# Patient Record
Sex: Male | Born: 1955 | Race: Black or African American | Hispanic: No | Marital: Married | Smoking: Former smoker
Health system: Southern US, Community
[De-identification: ages and names within clinical notes are randomized; demographics above are authoritative.]

## PROBLEM LIST (undated history)

## (undated) DIAGNOSIS — I10 Essential (primary) hypertension: Secondary | ICD-10-CM

## (undated) DIAGNOSIS — R569 Unspecified convulsions: Secondary | ICD-10-CM

## (undated) DIAGNOSIS — I639 Cerebral infarction, unspecified: Secondary | ICD-10-CM

## (undated) HISTORY — PX: OTHER SURGICAL HISTORY: SHX169

## (undated) HISTORY — PX: LUNG REMOVAL, PARTIAL: SHX233

## (undated) HISTORY — PX: CLOSED REDUCTION CRANIOFACIAL SEPARATION: SUR254

---

## 2004-04-01 ENCOUNTER — Other Ambulatory Visit: Payer: Self-pay

## 2005-10-03 ENCOUNTER — Emergency Department: Payer: Self-pay | Admitting: Emergency Medicine

## 2007-05-31 ENCOUNTER — Inpatient Hospital Stay: Payer: Self-pay | Admitting: Surgery

## 2007-05-31 ENCOUNTER — Other Ambulatory Visit: Payer: Self-pay

## 2007-06-07 ENCOUNTER — Other Ambulatory Visit: Payer: Self-pay

## 2007-06-07 ENCOUNTER — Emergency Department: Payer: Self-pay | Admitting: Emergency Medicine

## 2007-07-16 ENCOUNTER — Emergency Department: Payer: Self-pay | Admitting: Emergency Medicine

## 2007-07-16 ENCOUNTER — Ambulatory Visit: Payer: Self-pay | Admitting: Internal Medicine

## 2008-01-03 ENCOUNTER — Other Ambulatory Visit: Payer: Self-pay

## 2008-01-03 ENCOUNTER — Inpatient Hospital Stay: Payer: Self-pay | Admitting: Internal Medicine

## 2008-01-04 ENCOUNTER — Other Ambulatory Visit: Payer: Self-pay

## 2008-01-12 ENCOUNTER — Emergency Department: Payer: Self-pay | Admitting: Emergency Medicine

## 2008-01-12 ENCOUNTER — Other Ambulatory Visit: Payer: Self-pay

## 2008-01-13 ENCOUNTER — Other Ambulatory Visit: Payer: Self-pay

## 2008-01-13 ENCOUNTER — Inpatient Hospital Stay: Payer: Self-pay | Admitting: Internal Medicine

## 2009-01-26 ENCOUNTER — Inpatient Hospital Stay: Payer: Self-pay | Admitting: Internal Medicine

## 2009-03-06 ENCOUNTER — Emergency Department: Payer: Self-pay | Admitting: Emergency Medicine

## 2009-10-22 ENCOUNTER — Inpatient Hospital Stay: Payer: Self-pay | Admitting: Internal Medicine

## 2009-10-23 ENCOUNTER — Inpatient Hospital Stay: Payer: Self-pay | Admitting: Internal Medicine

## 2009-11-18 ENCOUNTER — Emergency Department: Payer: Self-pay | Admitting: Emergency Medicine

## 2009-11-29 ENCOUNTER — Observation Stay: Payer: Self-pay | Admitting: Internal Medicine

## 2010-01-04 ENCOUNTER — Emergency Department: Payer: Self-pay | Admitting: Emergency Medicine

## 2010-03-16 ENCOUNTER — Emergency Department: Payer: Self-pay | Admitting: Emergency Medicine

## 2010-04-05 IMAGING — CR DG CHEST 1V PORT
1 series · 1 of 1 positions shown · non-contrast
Comparison: none

REASON FOR EXAM: Chest Pain
COMMENTS:

[view not recorded]
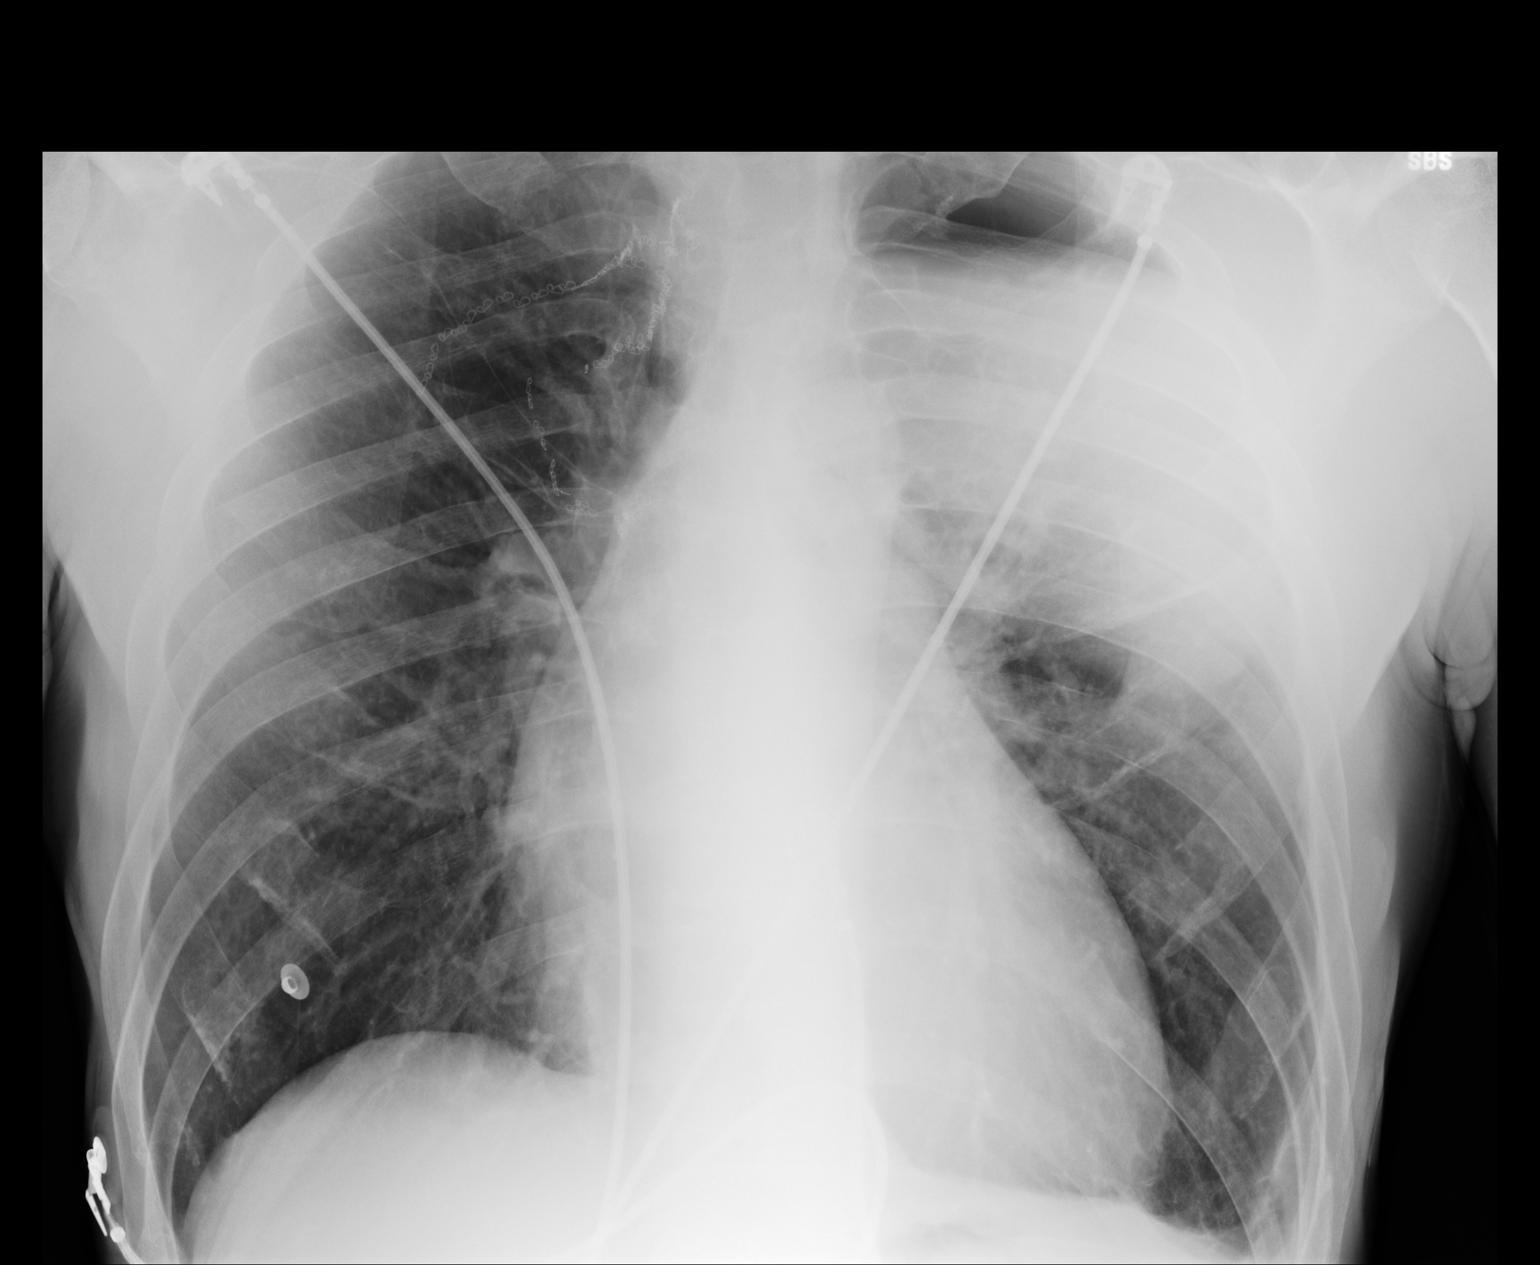

[1 of 1 positions shown; findings below may reference images not displayed]

PROCEDURE:     DXR - DXR PORTABLE CHEST SINGLE VIEW  - November 29, 2009  [DATE]

RESULT:     Comparison is made to the prior exam of 10/23/2009. There is
again noted increased density in the left upper lobe having the appearance
of fluid within a large bullous. The density is not changed appreciably as
compared to the prior exam. No new infiltrates are seen. No interstitial or
pulmonary edema is observed. The heart is enlarged but stable in size as
compared to the prior exam. Monitoring electrodes are present. Postoperative
clips are again noted in the left upper lobe.
IMPRESSION: 1. There is a hazy mass-like density in the left upper lobe consistent with
fluid in a bullous as observed at prior CT. The finding remains essentially
unchanged as compared to the prior exam.
2. No new pulmonary infiltrates are identified.

## 2010-08-20 ENCOUNTER — Observation Stay: Payer: Self-pay | Admitting: Internal Medicine

## 2011-02-07 ENCOUNTER — Emergency Department: Payer: Self-pay | Admitting: Emergency Medicine

## 2011-10-05 ENCOUNTER — Emergency Department: Payer: Self-pay | Admitting: Emergency Medicine

## 2011-10-05 LAB — COMPREHENSIVE METABOLIC PANEL
Albumin: 3.4 g/dL (ref 3.4–5.0)
Alkaline Phosphatase: 121 U/L (ref 50–136)
BUN: 10 mg/dL (ref 7–18)
Bilirubin,Total: 0.4 mg/dL (ref 0.2–1.0)
Calcium, Total: 8.8 mg/dL (ref 8.5–10.1)
Chloride: 104 mmol/L (ref 98–107)
Co2: 29 mmol/L (ref 21–32)
Creatinine: 1.36 mg/dL — ABNORMAL HIGH (ref 0.60–1.30)
EGFR (African American): >60
EGFR (Non-African Amer.): 58 — ABNORMAL LOW
Osmolality: 280 (ref 275–301)
SGOT(AST): 31 U/L (ref 15–37)
SGPT (ALT): 35 U/L

## 2011-10-05 LAB — CBC
HGB: 13.8 g/dL (ref 13.0–18.0)
MCV: 96 fL (ref 80–100)
Platelet: 148 10*3/uL — ABNORMAL LOW (ref 150–440)
RBC: 4.22 10*6/uL — ABNORMAL LOW (ref 4.40–5.90)
RDW: 13.3 % (ref 11.5–14.5)
WBC: 6.6 10*3/uL (ref 3.8–10.6)

## 2011-10-05 LAB — URINALYSIS, COMPLETE
Blood: NEGATIVE
Ketone: NEGATIVE
Nitrite: NEGATIVE
Ph: 5 (ref 4.5–8.0)
Protein: NEGATIVE
Specific Gravity: 1.011 (ref 1.003–1.030)
WBC UR: <1 /HPF (ref 0–5)

## 2011-10-05 LAB — ETHANOL
Ethanol %: <0.003 % (ref 0.000–0.080)
Ethanol: <3 mg/dL

## 2011-11-02 ENCOUNTER — Emergency Department: Payer: Self-pay | Admitting: Internal Medicine

## 2011-11-02 LAB — COMPREHENSIVE METABOLIC PANEL
Calcium, Total: 8.6 mg/dL (ref 8.5–10.1)
Creatinine: 1.2 mg/dL (ref 0.60–1.30)
EGFR (African American): >60
EGFR (Non-African Amer.): >60
Glucose: 119 mg/dL — ABNORMAL HIGH (ref 65–99)
Potassium: 3.5 mmol/L (ref 3.5–5.1)
SGPT (ALT): 30 U/L
Sodium: 143 mmol/L (ref 136–145)
Total Protein: 7.3 g/dL (ref 6.4–8.2)

## 2011-11-02 LAB — CBC
HCT: 43.7 % (ref 40.0–52.0)
HGB: 14.8 g/dL (ref 13.0–18.0)
MCH: 32.4 pg (ref 26.0–34.0)
MCV: 96 fL (ref 80–100)
Platelet: 129 10*3/uL — ABNORMAL LOW (ref 150–440)
RBC: 4.57 10*6/uL (ref 4.40–5.90)
RDW: 13.5 % (ref 11.5–14.5)
WBC: 4.3 10*3/uL (ref 3.8–10.6)

## 2011-11-02 LAB — URINALYSIS, COMPLETE
Bacteria: NONE SEEN
Bilirubin,UR: NEGATIVE
Glucose,UR: NEGATIVE mg/dL (ref 0–75)
Ketone: NEGATIVE
Protein: NEGATIVE
Specific Gravity: 1.028 (ref 1.003–1.030)
WBC UR: 1 /HPF (ref 0–5)

## 2011-11-02 LAB — RAPID INFLUENZA A&B ANTIGENS

## 2011-11-02 LAB — PHENYTOIN LEVEL, TOTAL: Dilantin: 2.8 ug/mL — ABNORMAL LOW (ref 10.0–20.0)

## 2013-01-20 ENCOUNTER — Ambulatory Visit: Payer: Self-pay | Admitting: Emergency Medicine

## 2013-01-20 LAB — BASIC METABOLIC PANEL
Anion Gap: 8 (ref 7–16)
Calcium, Total: 8.6 mg/dL (ref 8.5–10.1)
Creatinine: 1.03 mg/dL (ref 0.60–1.30)
EGFR (Non-African Amer.): >60
Glucose: 101 mg/dL — ABNORMAL HIGH (ref 65–99)
Osmolality: 283 (ref 275–301)
Sodium: 141 mmol/L (ref 136–145)

## 2013-01-20 LAB — HEMOGLOBIN: HGB: 13.5 g/dL (ref 13.0–18.0)

## 2013-01-22 ENCOUNTER — Ambulatory Visit: Payer: Self-pay | Admitting: Emergency Medicine

## 2013-01-23 LAB — PATHOLOGY REPORT

## 2013-10-11 ENCOUNTER — Emergency Department: Payer: Self-pay | Admitting: Emergency Medicine

## 2013-10-11 LAB — COMPREHENSIVE METABOLIC PANEL
ALBUMIN: 3.6 g/dL (ref 3.4–5.0)
ANION GAP: 5 — AB (ref 7–16)
AST: 43 U/L — AB (ref 15–37)
Alkaline Phosphatase: 139 U/L — ABNORMAL HIGH
BUN: 16 mg/dL (ref 7–18)
Bilirubin,Total: 0.4 mg/dL (ref 0.2–1.0)
Calcium, Total: 9 mg/dL (ref 8.5–10.1)
Chloride: 105 mmol/L (ref 98–107)
Co2: 25 mmol/L (ref 21–32)
Creatinine: 1.23 mg/dL (ref 0.60–1.30)
EGFR (Non-African Amer.): >60
Glucose: 119 mg/dL — ABNORMAL HIGH (ref 65–99)
Osmolality: 272 (ref 275–301)
Potassium: 3.6 mmol/L (ref 3.5–5.1)
SGPT (ALT): 39 U/L (ref 12–78)
Sodium: 135 mmol/L — ABNORMAL LOW (ref 136–145)
Total Protein: 7.9 g/dL (ref 6.4–8.2)

## 2013-10-11 LAB — URINALYSIS, COMPLETE
BACTERIA: NONE SEEN
BLOOD: NEGATIVE
Bilirubin,UR: NEGATIVE
Glucose,UR: NEGATIVE mg/dL (ref 0–75)
KETONE: NEGATIVE
LEUKOCYTE ESTERASE: NEGATIVE
Nitrite: NEGATIVE
Ph: 6 (ref 4.5–8.0)
Protein: 30
RBC,UR: <1 /HPF (ref 0–5)
Specific Gravity: 1.018 (ref 1.003–1.030)
WBC UR: <1 /HPF (ref 0–5)

## 2013-10-11 LAB — CBC
HCT: 50.1 % (ref 40.0–52.0)
HGB: 17.3 g/dL (ref 13.0–18.0)
MCH: 32.1 pg (ref 26.0–34.0)
MCHC: 34.6 g/dL (ref 32.0–36.0)
MCV: 93 fL (ref 80–100)
Platelet: 153 10*3/uL (ref 150–440)
RBC: 5.41 10*6/uL (ref 4.40–5.90)
RDW: 12.9 % (ref 11.5–14.5)
WBC: 9 10*3/uL (ref 3.8–10.6)

## 2013-10-11 LAB — LIPASE, BLOOD: LIPASE: 344 U/L (ref 73–393)

## 2015-01-15 NOTE — Op Note (Signed)
PATIENT NAMKelli Churn:  Patton, [name] MR#:  [id number] DATE OF BIRTH:  09/02/1956  DATE OF PROCEDURE:  01/22/2013  PREOPERATIVE DIAGNOSES:    1.  Hydradenitis of the right axilla.    2.  Large infected cyst over the right side of the scalp of about 3 x 3 cm and had multiple openings and draining pus through it.  3.  Five scalp cysts, which were infected on various areas of the right side of the scalp and the left side of the scalp, which were excised.    POSTOPERATIVE DIAGNOSES:  1.  Hydradenitis of the right axilla.    2.  Large infected cyst over the right side of the scalp of about 3 x 3 cm and had multiple openings and draining pus through it.  3.  Five scalp cysts, which were infected on various areas of the right side of the scalp and the left side of the scalp, which was excised.    PROCEDURES PERFORMED:   1.  Wide excision of hydradenitis of the right axilla.  2.  Debridement and drainage of pus from the right scalp cyst. Debridement was skin and subcutaneous tissue with knife and electrocautery.  3.  Removal of multiple cysts of the scalp.   INDICATION FOR SURGERY:  This patient keeps getting infection in the skin from various abscesses. I have drained them 2 to 3 times in my office. This time, he came to the office, he had a very large infected cyst of the right scalp and multiple openings to it. A Also in the right axilla, he had multiple areas of hydradenitis draining, 4 or 5 areas on the scalp were draining. The patient was then brought to surgery.   DETAILS OF PROCEDURE:  Under general anesthesia, the right side of the arm was then prepped and draped. First of all with a marker, I  marked the extent of incisions. After cutting skin and subcutaneous tissue down to the fascia, excised this large area of the skin with multiple hydradenitis From there, and I completely excised it. After that, the wound was then closed in layers with 3-0 Vicryl and 4-0 nylon interrupted sutures.    After that,  we went to the right scalp area. The patient had a very large 3.5 cm benign infected cyst with multiple openings in it. It was, first of all, opened and sharply debrided a #15-blade, taking skin and subcutaneous tissue down to the scalp, and with the Bovie stopped the bleeding. After excising all this area, then the wound was then left open with dressings, because it was infected we did not want to close.   The patient had 2 cysts on the front of the scalp, which were also then excised and the wound was then closed with 4-0 nylon. There were 3 cysts on the back of the scalp, which were  also  excised one by one.  The wound was closed with 4-0 nylon sutures. The patient had another area on the back of the neck which was really infected and was completely excised. Had a lot of pus in it. I left it open and put a little packing in it. The patient tolerated the procedure well, sent to the recovery room in satisfactory condition.    ____________________________ Jerry RevereMasud S. Cecelia ByarsHashmi, MD msh:dmm D: 01/22/2013 11:28:20 ET T: 01/22/2013 11:58:06 ET JOB#: [id number]  cc: Jerry Yoon S. Cecelia ByarsHashmi, MD, <Dictator> Jerry ShellerErnest B. Maryellen PileEason, MD Jerry ReadyMASUD S Jerry Zielinski MD ELECTRONICALLY SIGNED 01/23/2013 14:01

## 2017-02-13 ENCOUNTER — Emergency Department: Payer: Medicare Other

## 2017-02-13 ENCOUNTER — Observation Stay
Admission: EM | Admit: 2017-02-13 | Discharge: 2017-02-15 | Disposition: A | Discharge status: Home / Self Care | Payer: Medicare Other | Attending: Internal Medicine | Admitting: Internal Medicine

## 2017-02-13 DIAGNOSIS — Z8249 Family history of ischemic heart disease and other diseases of the circulatory system: Secondary | ICD-10-CM | POA: Diagnosis not present

## 2017-02-13 DIAGNOSIS — Z91013 Allergy to seafood: Secondary | ICD-10-CM | POA: Insufficient documentation

## 2017-02-13 DIAGNOSIS — Z87891 Personal history of nicotine dependence: Secondary | ICD-10-CM | POA: Diagnosis not present

## 2017-02-13 DIAGNOSIS — R1310 Dysphagia, unspecified: Secondary | ICD-10-CM | POA: Diagnosis not present

## 2017-02-13 DIAGNOSIS — G40909 Epilepsy, unspecified, not intractable, without status epilepticus: Secondary | ICD-10-CM | POA: Diagnosis not present

## 2017-02-13 DIAGNOSIS — R0682 Tachypnea, not elsewhere classified: Secondary | ICD-10-CM | POA: Diagnosis not present

## 2017-02-13 DIAGNOSIS — R000 Tachycardia, unspecified: Secondary | ICD-10-CM | POA: Diagnosis not present

## 2017-02-13 DIAGNOSIS — Z87820 Personal history of traumatic brain injury: Secondary | ICD-10-CM | POA: Diagnosis not present

## 2017-02-13 DIAGNOSIS — R262 Difficulty in walking, not elsewhere classified: Secondary | ICD-10-CM | POA: Insufficient documentation

## 2017-02-13 DIAGNOSIS — Z79899 Other long term (current) drug therapy: Secondary | ICD-10-CM | POA: Insufficient documentation

## 2017-02-13 DIAGNOSIS — I10 Essential (primary) hypertension: Secondary | ICD-10-CM | POA: Diagnosis not present

## 2017-02-13 DIAGNOSIS — R4781 Slurred speech: Secondary | ICD-10-CM

## 2017-02-13 DIAGNOSIS — I160 Hypertensive urgency: Secondary | ICD-10-CM | POA: Diagnosis present

## 2017-02-13 DIAGNOSIS — I639 Cerebral infarction, unspecified: Secondary | ICD-10-CM

## 2017-02-13 DIAGNOSIS — R0600 Dyspnea, unspecified: Secondary | ICD-10-CM | POA: Diagnosis not present

## 2017-02-13 HISTORY — DX: Unspecified convulsions: R56.9

## 2017-02-13 HISTORY — DX: Cerebral infarction, unspecified: I63.9

## 2017-02-13 HISTORY — DX: Essential (primary) hypertension: I10

## 2017-02-13 LAB — CBC
HEMATOCRIT: 40.6 % (ref 40.0–52.0)
HEMOGLOBIN: 14 g/dL (ref 13.0–18.0)
MCH: 30.9 pg (ref 26.0–34.0)
MCHC: 34.4 g/dL (ref 32.0–36.0)
MCV: 89.9 fL (ref 80.0–100.0)
Platelets: 179 10*3/uL (ref 150–440)
RBC: 4.52 MIL/uL (ref 4.40–5.90)
RDW: 13.4 % (ref 11.5–14.5)
WBC: 6.1 10*3/uL (ref 3.8–10.6)

## 2017-02-13 LAB — DIFFERENTIAL
BASOS ABS: 0 10*3/uL (ref 0–0.1)
BASOS PCT: 1 %
EOS ABS: 0.2 10*3/uL (ref 0–0.7)
EOS PCT: 3 %
LYMPHS ABS: 1.4 10*3/uL (ref 1.0–3.6)
Lymphocytes Relative: 23 %
MONOS PCT: 7 %
Monocytes Absolute: 0.5 10*3/uL (ref 0.2–1.0)
Neutro Abs: 4.1 10*3/uL (ref 1.4–6.5)
Neutrophils Relative %: 66 %

## 2017-02-13 LAB — COMPREHENSIVE METABOLIC PANEL
ALT: 18 U/L (ref 17–63)
ANION GAP: 6 (ref 5–15)
AST: 22 U/L (ref 15–41)
Albumin: 3.8 g/dL (ref 3.5–5.0)
Alkaline Phosphatase: 84 U/L (ref 38–126)
BILIRUBIN TOTAL: 0.5 mg/dL (ref 0.3–1.2)
BUN: 20 mg/dL (ref 6–20)
CHLORIDE: 106 mmol/L (ref 101–111)
CO2: 27 mmol/L (ref 22–32)
Calcium: 9.6 mg/dL (ref 8.9–10.3)
Creatinine, Ser: 1.29 mg/dL — ABNORMAL HIGH (ref 0.61–1.24)
GFR, EST NON AFRICAN AMERICAN: 59 mL/min — AB (ref >60)
Glucose, Bld: 108 mg/dL — ABNORMAL HIGH (ref 65–99)
POTASSIUM: 3.3 mmol/L — AB (ref 3.5–5.1)
Sodium: 139 mmol/L (ref 135–145)
TOTAL PROTEIN: 7.6 g/dL (ref 6.5–8.1)

## 2017-02-13 LAB — APTT: APTT: 34 s (ref 24–36)

## 2017-02-13 LAB — TROPONIN I: TROPONIN I: 0.03 ng/mL — AB (ref <0.03)

## 2017-02-13 LAB — PROTIME-INR
INR: 1.04
Prothrombin Time: 13.6 seconds (ref 11.4–15.2)

## 2017-02-13 MED ORDER — IPRATROPIUM-ALBUTEROL 0.5-2.5 (3) MG/3ML IN SOLN
3.0000 mL | Freq: Once | RESPIRATORY_TRACT | Status: AC
  Administered 2017-02-14: 3 mL via RESPIRATORY_TRACT
  Filled 2017-02-13: qty 3

## 2017-02-13 NOTE — ED Triage Notes (Signed)
Pt presents via [facility] from home. Family called out for shallow breathing and HTN. EMS states pt had a stroke a month ago and since has slurred speech. Pt is blind in both eyes. Pt is answering questions appropriately, somewhat slurred. States he has been having difficulty swallowing. Denies pain. BP with EMS 229/137. Pt is alert and oriented x 4.

## 2017-02-13 NOTE — ED Notes (Signed)
Pt taken to xray before blood work could be drawn.  Family at bedside.   Family states that for last month pt has had increasing slurred speech and shallow breathing. Family states that based off symptoms that pt had a stroke a month ago but never got pt treated. Stated "he is stubborn." family states pt did NOT get any scans or blood drawn and is NOT on a blood thinner. States "his head goes back and he can't talk so he had a stroke." states "any time he eats or drinks anything he chokes on it." asked if pt is on a dysphagia diet and they state no he is eating all regular foods.

## 2017-02-14 ENCOUNTER — Observation Stay: Payer: Medicare Other

## 2017-02-14 ENCOUNTER — Encounter: Payer: Self-pay | Admitting: Internal Medicine

## 2017-02-14 DIAGNOSIS — R1310 Dysphagia, unspecified: Secondary | ICD-10-CM | POA: Diagnosis not present

## 2017-02-14 DIAGNOSIS — I638 Other cerebral infarction: Secondary | ICD-10-CM | POA: Diagnosis not present

## 2017-02-14 DIAGNOSIS — G40909 Epilepsy, unspecified, not intractable, without status epilepticus: Secondary | ICD-10-CM | POA: Diagnosis not present

## 2017-02-14 DIAGNOSIS — I160 Hypertensive urgency: Secondary | ICD-10-CM | POA: Diagnosis not present

## 2017-02-14 DIAGNOSIS — I69320 Aphasia following cerebral infarction: Secondary | ICD-10-CM | POA: Diagnosis not present

## 2017-02-14 LAB — TSH: TSH: 0.127 u[IU]/mL — ABNORMAL LOW (ref 0.350–4.500)

## 2017-02-14 LAB — BASIC METABOLIC PANEL
Anion gap: 5 (ref 5–15)
BUN: 18 mg/dL (ref 6–20)
CO2: 28 mmol/L (ref 22–32)
Calcium: 9.2 mg/dL (ref 8.9–10.3)
Chloride: 105 mmol/L (ref 101–111)
Creatinine, Ser: 1.3 mg/dL — ABNORMAL HIGH (ref 0.61–1.24)
GFR calc Af Amer: >60 mL/min (ref >60)
GFR, EST NON AFRICAN AMERICAN: 58 mL/min — AB (ref >60)
GLUCOSE: 111 mg/dL — AB (ref 65–99)
POTASSIUM: 3.3 mmol/L — AB (ref 3.5–5.1)
Sodium: 138 mmol/L (ref 135–145)

## 2017-02-14 MED ORDER — SODIUM CHLORIDE 0.9 % IV SOLN
INTRAVENOUS | Status: DC
  Administered 2017-02-14: 04:00:00 via INTRAVENOUS

## 2017-02-14 MED ORDER — ONDANSETRON HCL 4 MG PO TABS: 4.0000 mg | ORAL_TABLET | Freq: Four times a day (QID) | ORAL | Status: DC | PRN

## 2017-02-14 MED ORDER — ONDANSETRON HCL 4 MG/2ML IJ SOLN: 4.0000 mg | Freq: Four times a day (QID) | INTRAMUSCULAR | Status: DC | PRN

## 2017-02-14 MED ORDER — GLYCOPYRROLATE 0.2 MG/ML IJ SOLN
0.0500 mg | Freq: Once | INTRAMUSCULAR | Status: AC
  Administered 2017-02-14: 0.05 mg via INTRAVENOUS
  Filled 2017-02-14: qty 0.25

## 2017-02-14 MED ORDER — HYDRALAZINE HCL 20 MG/ML IJ SOLN
10.0000 mg | Freq: Once | INTRAMUSCULAR | Status: AC
  Administered 2017-02-14: 10 mg via INTRAVENOUS
  Filled 2017-02-14: qty 1

## 2017-02-14 MED ORDER — HEPARIN SODIUM (PORCINE) 5000 UNIT/ML IJ SOLN
5000.0000 [IU] | Freq: Three times a day (TID) | INTRAMUSCULAR | Status: DC
  Administered 2017-02-14 – 2017-02-15 (×5): 5000 [IU] via SUBCUTANEOUS
  Filled 2017-02-14 (×5): qty 1

## 2017-02-14 MED ORDER — DOCUSATE SODIUM 100 MG PO CAPS
100.0000 mg | ORAL_CAPSULE | Freq: Two times a day (BID) | ORAL | Status: DC
  Administered 2017-02-14 – 2017-02-15 (×3): 100 mg via ORAL
  Filled 2017-02-14 (×3): qty 1

## 2017-02-14 MED ORDER — CLONIDINE HCL 0.1 MG PO TABS
0.2000 mg | ORAL_TABLET | Freq: Two times a day (BID) | ORAL | Status: DC
  Administered 2017-02-14 – 2017-02-15 (×3): 0.2 mg via ORAL
  Filled 2017-02-14 (×3): qty 2

## 2017-02-14 MED ORDER — PHENYTOIN SODIUM EXTENDED 100 MG PO CAPS
400.0000 mg | ORAL_CAPSULE | Freq: Every day | ORAL | Status: DC
Start: 2017-02-14 — End: 2017-02-15
  Administered 2017-02-14 – 2017-02-15 (×2): 400 mg via ORAL
  Filled 2017-02-14 (×2): qty 4

## 2017-02-14 MED ORDER — ORAL CARE MOUTH RINSE
15.0000 mL | Freq: Two times a day (BID) | OROMUCOSAL | Status: DC
  Administered 2017-02-14 – 2017-02-15 (×2): 15 mL via OROMUCOSAL

## 2017-02-14 MED ORDER — ATORVASTATIN CALCIUM 20 MG PO TABS
40.0000 mg | ORAL_TABLET | Freq: Every day | ORAL | Status: DC
  Administered 2017-02-14: 40 mg via ORAL
  Filled 2017-02-14: qty 2

## 2017-02-14 MED ORDER — POTASSIUM CHLORIDE CRYS ER 20 MEQ PO TBCR
20.0000 meq | EXTENDED_RELEASE_TABLET | Freq: Once | ORAL | Status: AC
  Administered 2017-02-14: 15:00:00 20 meq via ORAL
  Filled 2017-02-14: qty 1

## 2017-02-14 MED ORDER — LABETALOL HCL 100 MG PO TABS
300.0000 mg | ORAL_TABLET | Freq: Three times a day (TID) | ORAL | Status: DC
  Administered 2017-02-14 – 2017-02-15 (×3): 300 mg via ORAL
  Filled 2017-02-14: qty 1
  Filled 2017-02-14 (×2): qty 3
  Filled 2017-02-14: qty 1
  Filled 2017-02-14: qty 3

## 2017-02-14 MED ORDER — ASPIRIN EC 81 MG PO TBEC
81.0000 mg | DELAYED_RELEASE_TABLET | Freq: Every day | ORAL | Status: DC
  Administered 2017-02-14 – 2017-02-15 (×2): 81 mg via ORAL
  Filled 2017-02-14 (×2): qty 1

## 2017-02-14 MED ORDER — HYDROCHLOROTHIAZIDE 25 MG PO TABS
25.0000 mg | ORAL_TABLET | Freq: Every day | ORAL | Status: DC
  Administered 2017-02-14 – 2017-02-15 (×2): 25 mg via ORAL
  Filled 2017-02-14 (×2): qty 1

## 2017-02-14 MED ORDER — ACETAMINOPHEN 650 MG RE SUPP: 650.0000 mg | Freq: Four times a day (QID) | RECTAL | Status: DC | PRN

## 2017-02-14 MED ORDER — MIRTAZAPINE 15 MG PO TABS
45.0000 mg | ORAL_TABLET | Freq: Every day | ORAL | Status: DC
  Administered 2017-02-14: 45 mg via ORAL
  Filled 2017-02-14: qty 3

## 2017-02-14 MED ORDER — NITROGLYCERIN 2 % TD OINT
TOPICAL_OINTMENT | TRANSDERMAL | Status: AC
  Administered 2017-02-14: 0.5 [in_us] via TOPICAL
  Filled 2017-02-14: qty 1

## 2017-02-14 MED ORDER — PANTOPRAZOLE SODIUM 40 MG PO TBEC
40.0000 mg | DELAYED_RELEASE_TABLET | Freq: Every day | ORAL | Status: DC
  Administered 2017-02-14 – 2017-02-15 (×2): 40 mg via ORAL
  Filled 2017-02-14 (×2): qty 1

## 2017-02-14 MED ORDER — NITROGLYCERIN 2 % TD OINT
0.5000 [in_us] | TOPICAL_OINTMENT | TRANSDERMAL | Status: AC
  Administered 2017-02-14: 0.5 [in_us] via TOPICAL

## 2017-02-14 MED ORDER — GLYCOPYRROLATE 1 MG PO TABS
1.0000 mg | ORAL_TABLET | ORAL | Status: DC
  Filled 2017-02-14: qty 1

## 2017-02-14 MED ORDER — METOPROLOL TARTRATE 50 MG PO TABS
100.0000 mg | ORAL_TABLET | Freq: Two times a day (BID) | ORAL | Status: DC
  Administered 2017-02-14: 100 mg via ORAL
  Filled 2017-02-14: qty 2

## 2017-02-14 MED ORDER — ACETAMINOPHEN 325 MG PO TABS: 650.0000 mg | ORAL_TABLET | Freq: Four times a day (QID) | ORAL | Status: DC | PRN

## 2017-02-14 MED ORDER — BUSPIRONE HCL 5 MG PO TABS
15.0000 mg | ORAL_TABLET | Freq: Two times a day (BID) | ORAL | Status: DC
  Administered 2017-02-14 – 2017-02-15 (×3): 15 mg via ORAL
  Filled 2017-02-14 (×3): qty 3

## 2017-02-14 MED ORDER — HYDRALAZINE HCL 50 MG PO TABS
100.0000 mg | ORAL_TABLET | Freq: Four times a day (QID) | ORAL | Status: DC
  Administered 2017-02-14 – 2017-02-15 (×6): 100 mg via ORAL
  Filled 2017-02-14 (×6): qty 2

## 2017-02-14 MED ORDER — LORATADINE 10 MG PO TABS
10.0000 mg | ORAL_TABLET | Freq: Every day | ORAL | Status: DC
  Administered 2017-02-14 – 2017-02-15 (×2): 10 mg via ORAL
  Filled 2017-02-14 (×2): qty 1
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
## 2017-02-14 NOTE — ED Provider Notes (Signed)
[facility] Emergency Department Provider Note   ____________________________________________   First MD Initiated Contact with Patient 02/13/17 2312     (approximate)  I have reviewed the triage vital signs and the nursing notes.   HISTORY  Chief Complaint Hypertension    HPI [name] is a 61 y.o. male who comes into the hospital today with difficulty breathing and choking. The patient's family reports that he had a stroke or what seem like a stroke about 6 or 7 months ago. They report that he developed some slurred speech and generalized weakness. They report that he also chokes whenever he tries to swallow food. The patient was never seen or followed up with after he had a stroke. The family reports that he's been very stubborn and never wanted to be seen. He's had slurred speech and they said that he gets choked whenever he is laughing. He also has difficulty walking. The family thought that the symptoms would improve but never did. He also has had some difficulty breathing since his stroke. They report that nothing has changed but today he reports that he just was having too much of a hard time breathing so he decided to have them call the ambulance to bring him in. The family reports that this was only time that he wanted to come to the hospital so they brought him. The patient denies any chest pain but has had 2 weeks of nausea and vomiting. He's been taking his medication and according to the family he can swallow his pills and does not get choked on them. He has not missed any medications denies any abdominal pain. He has been weak all over according to the family. He is here today for evaluation of his symptoms.    Past Medical History:  Diagnosis Date  . Hypertension   . Seizures (HCC)   . Stroke [facility])     There are no active problems to display for this patient.   History reviewed. No pertinent surgical history.  Prior to Admission  medications   Medication Sig Start Date End Date Taking? Authorizing Provider  busPIRone (BUSPAR) 15 MG tablet Take 15 mg by mouth 2 (two) times daily.   Yes [provider]  cetirizine (ZYRTEC) 10 MG tablet Take 10 mg by mouth daily.   Yes [provider]  hydrALAZINE (APRESOLINE) 100 MG tablet Take 100 mg by mouth 4 (four) times daily.   Yes [provider]  hydrochlorothiazide (HYDRODIURIL) 25 MG tablet Take 25 mg by mouth daily.   Yes [provider]  meloxicam (MOBIC) 15 MG tablet Take 15 mg by mouth daily.   Yes [provider]  metoprolol tartrate (LOPRESSOR) 100 MG tablet Take 100 mg by mouth 2 (two) times daily.   Yes [provider]  mirtazapine (REMERON) 45 MG tablet Take 45 mg by mouth at bedtime.   Yes [provider]  omeprazole (PRILOSEC) 20 MG capsule Take 40 mg by mouth daily.   Yes [provider]  phenytoin (DILANTIN) 100 MG ER capsule Take 400 mg by mouth daily.   Yes [provider]    Allergies Shellfish allergy  History reviewed. No pertinent family history.  Social History Social History  Substance Use Topics  . Smoking status: Former Games developer  . Smokeless tobacco: Not on file  . Alcohol use No    Review of Systems  Constitutional: No fever/chills Eyes: No visual changes. ENT: No sore throat. Cardiovascular: Denies chest pain. Respiratory:  shortness of breath. Gastrointestinal: Nausea and vomiting, No abdominal pain.  No diarrhea.  No constipation. Genitourinary: Negative for dysuria. Musculoskeletal: Negative for back pain. Skin: Negative for rash. Neurological: Slurred speech, dysphasia and weakness   ____________________________________________   PHYSICAL EXAM:  VITAL SIGNS: ED Triage Vitals  Enc Vitals Group     BP 02/13/17 2249 (!) 207/121     Pulse Rate 02/13/17 2249 84     Resp 02/13/17 2249 17     Temp 02/13/17 2249 97.6 F (36.4 C)     Temp Source  02/13/17 2249 Oral     SpO2 02/13/17 2249 99 %     Weight --      Height --      Head Circumference --      Peak Flow --      Pain Score 02/13/17 2243 0     Pain Loc --      Pain Edu? --      Excl. in GC? --     Constitutional: Alert and oriented. Well appearing and in Moderate distress. Eyes: Conjunctivae are normal. PERRL. EOMI. Head: Atraumatic. Nose: No congestion/rhinnorhea. Mouth/Throat: Mucous membranes are moist.  Oropharynx non-erythematous. Cardiovascular: Normal rate, regular rhythm. Grossly normal heart sounds.  Good peripheral circulation. Respiratory: Increased respiratory effort.  No retractions. Lungs CTAB. Halting breath sounds Gastrointestinal: Soft and nontender. No distention. Positive bowel sounds Musculoskeletal: No lower extremity tenderness nor edema.   Neurologic:  Normal speech and language.  Skin:  Skin is warm, dry and intact.  Psychiatric: Mood and affect are normal. Slurred speech this difficult to understand, according to family that is his baseline for the last few months.  ____________________________________________   LABS (all labs ordered are listed, but only abnormal results are displayed)  Labs Reviewed  COMPREHENSIVE METABOLIC PANEL - Abnormal; Notable for the following:       Result Value   Potassium 3.3 (*)    Glucose, Bld 108 (*)    Creatinine, Ser 1.29 (*)    GFR calc non Af Amer 59 (*)    All other components within normal limits  TROPONIN I - Abnormal; Notable for the following:    Troponin I 0.03 (*)    All other components within normal limits  BLOOD GAS, VENOUS - Abnormal; Notable for the following:    pCO2, Ven 43 (*)    pO2, Ven 68.0 (*)    Acid-Base Excess 2.9 (*)    All other components within normal limits  PROTIME-INR  APTT  CBC  DIFFERENTIAL  CBG MONITORING, ED   ____________________________________________  EKG  none ____________________________________________  RADIOLOGY  CT  head CXR ____________________________________________   PROCEDURES  Procedure(s) performed: None  Procedures  Critical Care performed: No  ____________________________________________   INITIAL IMPRESSION / ASSESSMENT AND PLAN / ED COURSE  Pertinent labs & imaging results that were available during my care of the patient were reviewed by me and considered in my medical decision making (see chart for details).  This is a 61 year old who comes into the hospital today with some difficulty breathing. The patient had a stroke multiple months ago and has not been seen or evaluated. The patient's blood pressure is also very elevated. His family states that he has been taking his medication but he has not been seen or evaluated. The patient will receive some DuoNeb to see if that helps with his breathing. He seems to have some difficulty managing his secretions and that could be contributing to his difficulty breathing.  The patient also has some CT scan with no acute intracranial abnormalities but I did give the patient some hydralazine for his blood pressure and I will reassess the patient.  Clinical Course as of Feb 15 211  Tue Feb 13, 2017  2327 1. Postsurgical changes on the right. Emphysematous disease. No acute infiltrate or edema 2. Linear atelectasis or scar at the left base 3. Probable hiatal hernia   DG Chest 2 View [AW]  Wed Feb 14, 2017  0008 No acute intracranial abnormalities. Chronic atrophy and small vessel ischemic changes. Multiple metallic foreign bodies demonstrated in the soft tissues. Bilateral globes are atrophic and calcified.   CT Head Wo Contrast [AW]    Clinical Course User Index [AW] Rebecka ApleyWebster, Ernest Orr P, MD   I did give the patient some hydralazine for his blood pressure. It initially came down but went right back up. I gave the patient a second dose of 10 mg and made the decision to admit the patient to the hospitalist service. Although his symptoms are  not new he is unable to swallow well and he did have an episode where he could not tolerate his secretions. He did become tachycardic and seemed to be choking. We gave the patient some suction to help him manage some of his secretions. I did give the patient a DuoNeb treatment as well to see if that would help with his breathing. His respiratory rate is still in the 20s but again he will be admitted to the hospitalist service for further evaluation.  ____________________________________________   FINAL CLINICAL IMPRESSION(S) / ED DIAGNOSES  Final diagnoses:  Hypertensive urgency  Dysphagia, unspecified type  Slurred speech      NEW MEDICATIONS STARTED DURING THIS VISIT:  New Prescriptions   No medications on file     Note:  This document was prepared using Dragon voice recognition software and may include unintentional dictation errors.    Rebecka ApleyWebster, Allona Gondek P, MD 02/14/17 [phone]

## 2017-02-14 NOTE — Plan of Care (Signed)
Problem: Education: Goal: Knowledge of [facility] Education information/materials will improve Outcome: Progressing Pt likes to be called [name]  Past Medical History:  Diagnosis Date  . Hypertension   . Seizures (HCC)   . Stroke [facility](HCC)    Pt is well controlled with home medications

## 2017-02-14 NOTE — Evaluation (Signed)
Clinical/Bedside Swallow Evaluation Patient Details  Name: [name] MRN: [id number] Date of Birth: March 08, 1956  Today's Date: 02/14/2017 Time: SLP Start Time (ACUTE ONLY): 1000 SLP Stop Time (ACUTE ONLY): 1100 SLP Time Calculation (min) (ACUTE ONLY): 60 min  Past Medical History:  Past Medical History:  Diagnosis Date  . Hypertension   . Seizures (HCC)   . Stroke [facility])    Past Surgical History:  Past Surgical History:  Procedure Laterality Date  . CLOSED REDUCTION CRANIOFACIAL SEPARATION    . LUNG REMOVAL, PARTIAL Right    HPI:  Pt with past medical history of seizure disorder following traumatic brain injury, hypertension and remote stroke presents to the emergency department due to concern regarding elevated blood pressure. Upon arrival patient's systolic pressure was greater than 220. He denied headache but his family was concerned that his pressure may be related to his change in voice. The patient was given hydralazine and the admitting physician placed Nitropaste on the patient's chest which improved his lipid pressure significantly. By the time the emergency department staff called for admission his family reports that his voice was back to baseline which admittedly is difficult to understand (at baseline secondary to TBI). Currently, pt is awake, verbally conversive. Pt is blind at baseline and requires assistance w/ some ADLs. Wife denied any difficulty swallowing at baseline.     Assessment / Plan / Recommendation Clinical Impression  Pt appears to present w/ an adequate pharyngeal swallow function w/ no overt s/s of aspiration noted; no overt coughing or throat clearing, no decline in vocal quality or change in respirations during trials - pt does not drink using straws, Cup preferred per pt/wife. Oral phase was grossly wfl w/ bolus management and clearing; min increased time w/ softened solids secondary to missing dentition. Pt required assistance w/ feeding d/t vision  deficit. Recommend a mech soft diet consistency for pt for easier mastication/management; thin liquids. Recommend general aspiration precautions; Pills in Puree for easier swallowing as necessary w/ NSG. Education given to Wife present on general aspiration precautions. NSG updated.  SLP Visit Diagnosis: Dysphagia, oral phase (R13.11)    Aspiration Risk   (reduced following precautions)    Diet Recommendation  Mech Soft consistency(level 3); Thin liquids. General aspiration precautions; assistance at meals d/t vision deficit  Medication Administration: Whole meds with puree    Other  Recommendations Recommended Consults:  (n/a at this time) Oral Care Recommendations: Oral care BID;Staff/trained caregiver to provide oral care;Patient independent with oral care Other Recommendations:  (n/a)   Follow up Recommendations None      Frequency and Duration  n/a         Prognosis Prognosis for Safe Diet Advancement: Good Barriers to Reach Goals:  (baseline status; h/o TBI)      Swallow Study   General Date of Onset: 02/13/17 HPI: Pt with past medical history of seizure disorder following traumatic brain injury, hypertension and remote stroke presents to the emergency department due to concern regarding elevated blood pressure. Upon arrival patient's systolic pressure was greater than 220. He denied headache but his family was concerned that his pressure may be related to his change in voice. The patient was given hydralazine and the admitting physician placed Nitropaste on the patient's chest which improved his lipid pressure significantly. By the time the emergency department staff called for admission his family reports that his voice was back to baseline which admittedly is difficult to understand (at baseline secondary to TBI). Currently, pt is awake, verbally conversive.  Pt is blind at baseline and requires assistance w/ some ADLs. Wife denied any difficulty swallowing at baseline.   Type  of Study: Bedside Swallow Evaluation Previous Swallow Assessment: none Diet Prior to this Study: Regular;Dysphagia 3 (soft);Thin liquids Temperature Spikes Noted: No (wbc 6.1) Respiratory Status: Room air History of Recent Intubation: No Behavior/Cognition: Alert;Cooperative;Pleasant mood;Requires cueing (bsaeline TBI) Oral Cavity Assessment: Within Functional Limits Oral Care Completed by SLP: Recent completion by staff Oral Cavity - Dentition: Missing dentition Vision: Impaired for self-feeding (blind at baseline) Self-Feeding Abilities: Needs assist;Needs set up Patient Positioning: Upright in bed Baseline Vocal Quality: Normal Volitional Cough: Strong Volitional Swallow: Able to elicit    Oral/Motor/Sensory Function Overall Oral Motor/Sensory Function: Within functional limits   Ice Chips Ice chips: Within functional limits Presentation: Spoon (fed; 3 trials)   Thin Liquid Thin Liquid: Within functional limits Presentation: Cup;Self Fed (assisted; ~6 ozs) Other Comments: no straws    Nectar Thick Nectar Thick Liquid: Not tested   Honey Thick Honey Thick Liquid: Not tested   Puree Puree: Within functional limits Presentation: Self Fed;Spoon (8 trials; assisted)   Solid   GO   Solid: Impaired (mech soft, broken down ) Presentation: Spoon (fed; 3 trials) Oral Phase Impairments: Impaired mastication (missing dentition) Oral Phase Functional Implications:  (min increased time d/t missing dentition) Pharyngeal Phase Impairments:  (none) Other Comments: cleared given time    Functional Assessment Tool Used: clinical judgement Functional Limitations: Swallowing Swallow Current Status ([id number]): At least 1 percent but less than 20 percent impaired, limited or restricted Swallow Goal Status [phone](G8997): At least 1 percent but less than 20 percent impaired, limited or restricted Swallow Discharge Status [phone](G8998): At least 1 percent but less than 20 percent impaired, limited or  restricted     Jerry SomKatherine Watson, MS, CCC-SLP Patton,Katherine 02/14/2017,5:22 PM

## 2017-02-14 NOTE — Progress Notes (Addendum)
Initial Nutrition Assessment  DOCUMENTATION CODES:   Underweight  INTERVENTION:   Magic cup TID with meals, each supplement provides 290 kcal and 9 grams of protein  Snacks  NUTRITION DIAGNOSIS:   Underweight related to other (see comment) (dysphagia and h/o TBI & stroke) as evidenced by moderate depletions of muscle mass, other (see comment) (75% IBW).  GOAL:   Patient will meet greater than or equal to 90% of their needs  MONITOR:   PO intake, Supplement acceptance, Labs, Weight trends  REASON FOR ASSESSMENT:   Other (Comment) (low BMI)    ASSESSMENT:   61 y/o male with h/o TBI w/ seizures, HTN, stroke, and dysphagia admitted for hypertensive urgency   Met with pt and family in room today. Family reports that pt is a good eater and has a great appetite at home. Pt has been having trouble chewing and swallowing for the past 6 months. Family reports that pt had a stroke about 6-8 months ago and then started having dysphagia. Pt does not have many top teeth. Pt has been on a dysphagia 3 diet in hospital and family reports that pt is doing much better with the chopped food and is eating 100% of meals with no problems. Pt does not like supplements but would like to have ice cream and banana for snack. RD will order Magic Cups to help pt meet estimated protein needs as pt is 6'9" tall and has moderate muscle depletions. Pt's family reports that pt does appear to have lost weight but they are unsure as to how much; there is no weight history in chart for this pt. Pt with hypokalemia; monitor and supplement as needed per MD discretion.   Medications reviewed and include: aspirin, colace, hydrochlorothiazide, remeron, protonix  Labs reviewed: K 3.3(L), creat 1.3(H)  Nutrition-Focused physical exam completed. Findings are no fat depletion, moderate muscle depletions in clavicles, chest, and legs, and no edema.   Diet Order:  DIET DYS 3 Room service appropriate? Yes with Assist; Fluid  consistency: Thin  Skin:  Wound (see comment) (lumbar abrasion)  Last BM:  5/21  Height:   Ht Readings from Last 1 Encounters:  02/14/17 _0  (2.057 m)    Weight:   Wt Readings from Last 1 Encounters:  02/14/17 173 lb 1.6 oz (78.5 kg)    Ideal Body Weight:  105 kg  BMI:  Body mass index is 18.55 kg/m.  Estimated Nutritional Needs:   Kcal:  2300-2700kcal/day   Protein:  126-142g/day  Fluid:  >2.3L/day   EDUCATION NEEDS:   No education needs identified at this time  Koleen Distance MS, RD, LDN Pager #- [phone]
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
## 2017-02-15 DIAGNOSIS — R1310 Dysphagia, unspecified: Secondary | ICD-10-CM | POA: Diagnosis not present

## 2017-02-15 DIAGNOSIS — I160 Hypertensive urgency: Secondary | ICD-10-CM | POA: Diagnosis not present

## 2017-02-15 DIAGNOSIS — I69320 Aphasia following cerebral infarction: Secondary | ICD-10-CM | POA: Diagnosis not present

## 2017-02-15 DIAGNOSIS — G40909 Epilepsy, unspecified, not intractable, without status epilepticus: Secondary | ICD-10-CM | POA: Diagnosis not present

## 2017-02-15 LAB — BLOOD GAS, VENOUS
Acid-Base Excess: 2.9 mmol/L — ABNORMAL HIGH (ref 0.0–2.0)
BICARBONATE: 27.9 mmol/L (ref 20.0–28.0)
O2 Saturation: 93.6 %
PATIENT TEMPERATURE: 37
PCO2 VEN: 43 mmHg — AB (ref 44.0–60.0)
PH VEN: 7.42 (ref 7.250–7.430)
PO2 VEN: 68 mmHg — AB (ref 32.0–45.0)

## 2017-02-15 LAB — HIV ANTIBODY (ROUTINE TESTING W REFLEX): HIV Screen 4th Generation wRfx: NONREACTIVE

## 2017-02-15 LAB — HEMOGLOBIN A1C
Hgb A1c MFr Bld: 5.3 % (ref 4.8–5.6)
Mean Plasma Glucose: 105 mg/dL

## 2017-02-15 MED ORDER — ASPIRIN 81 MG PO TBEC: 81.0000 mg | DELAYED_RELEASE_TABLET | Freq: Every day | ORAL | Status: AC

## 2017-02-15 MED ORDER — ATORVASTATIN CALCIUM 40 MG PO TABS: 40.0000 mg | ORAL_TABLET | Freq: Every day | ORAL | 0 refills | Status: AC

## 2017-02-15 MED ORDER — SENNA 8.6 MG PO TABS
2.0000 | ORAL_TABLET | Freq: Once | ORAL | Status: AC
  Administered 2017-02-15: 12:00:00 17.2 mg via ORAL
  Filled 2017-02-15: qty 2

## 2017-02-15 MED ORDER — POLYETHYLENE GLYCOL 3350 17 G PO PACK: 17.0000 g | PACK | Freq: Every day | ORAL | 0 refills | Status: AC

## 2017-02-15 MED ORDER — DOCUSATE SODIUM 100 MG PO CAPS
200.0000 mg | ORAL_CAPSULE | Freq: Two times a day (BID) | ORAL | Status: DC
  Administered 2017-02-15: 200 mg via ORAL
  Filled 2017-02-15: qty 2

## 2017-02-15 MED ORDER — CLONIDINE HCL 0.2 MG PO TABS: 0.2000 mg | ORAL_TABLET | Freq: Two times a day (BID) | ORAL | 11 refills | Status: AC

## 2017-02-15 MED ORDER — LABETALOL HCL 300 MG PO TABS: 300.0000 mg | ORAL_TABLET | Freq: Three times a day (TID) | ORAL | 0 refills | Status: DC

## 2017-02-15 MED ORDER — POLYETHYLENE GLYCOL 3350 17 G PO PACK
17.0000 g | PACK | Freq: Every day | ORAL | Status: DC
  Administered 2017-02-15: 12:00:00 17 g via ORAL
  Filled 2017-02-15: qty 1

## 2017-02-15 NOTE — Progress Notes (Signed)
Pt is being discharged home with home health. Discharge papers given and explained to pt and pt's spouse, spouse verbalized understanding. Meds and f/u appointment reviewed. RX given. Awaiting transportation.

## 2017-02-15 NOTE — Evaluation (Signed)
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 Upon arrival patient's systolic pressure was greater than 220  Clinical Impression  Pt showed great effort with PT and he and wife report that the 125 ft was the farthest he has walked with a walker in a long time. Pt was not safe with this and needed constant assist to control the walker and generally showed moderate awareness despite constant cuing.  He would benefit from HHPT to work on ambulation and other functional mobility tasks.  2    Follow Up Recommendations Home health PT    Equipment Recommendations       Recommendations for Other Services       Precautions / Restrictions Precautions Precautions: Fall Restrictions Weight Bearing Restrictions: No      Mobility  Bed Mobility Overal bed mobility: Modified Independent             General bed mobility comments: Pt needed light assist/guidance to get to sitting EOB  Transfers Overall transfer level: Needs assistance Equipment used: Rolling walker (2 wheeled) Transfers: Sit to/from Stand Sit to Stand: Min assist;Min guard         General transfer comment: light assist to shift weight forward, cues for hand placement and set up  Ambulation/Gait Ambulation/Gait assistance: Mod assist Ambulation Distance (Feet): 125 Feet Assistive device: Rolling walker (2 wheeled)       General Gait Details: Pt needed direct assist to control walker the entire episode.  He was heavily reliant on the walker and with this and vision issues he veered L much of the time.  He was impulsive, did not have good control of L ankle and generally showed great effort but  simply was not safe w/o constant cuing and considerable assist.   Stairs            Wheelchair Mobility    Modified Rankin (Stroke Patients Only)       Balance Overall balance assessment: Needs assistance Sitting-balance support: No upper extremity supported Sitting balance-Leahy Scale: Normal     Standing balance support: Bilateral upper extremity supported Standing balance-Leahy Scale: Fair Standing balance comment: Pt able to maintain static upright balance in standing, but with any dynamic movement his safety went down signficantly.  Pt impulsive with stepping and generally with any moving with walker                             Pertinent Vitals/Pain Pain Assessment: No/denies pain    Home Living Family/patient expects to be discharged to:: Private residence Living Arrangements: Spouse/significant other Available Help at Discharge: Family   Home Access:  (wife states they do not have to do steps to get w/c out)       Home Equipment: Environmental consultantWalker - 2 wheels;Wheelchair - manual      Prior Function Level of Independence: Independent with assistive device(s)         Comments: minimally ambulatory in the home, generally in w/c - out in community ~1x/wk     Hand Dominance        Extremity/Trunk Assessment   Upper Extremity Assessment Upper Extremity Assessment: Generalized weakness;Overall [facility] for tasks assessed    Lower Extremity  Assessment Lower Extremity Assessment: Generalized weakness;Overall WFL for tasks assessed (limited L ankle control, )       Communication   Communication:  (garbbled/labored speech)  Cognition Arousal/Alertness: Awake/alert Behavior During Therapy: Impulsive Overall Cognitive Status: History of cognitive impairments - at baseline                                        General Comments      Exercises     Assessment/Plan    PT Assessment Patient needs continued PT services  PT Problem List  Decreased strength;Decreased range of motion;Decreased activity tolerance;Decreased balance;Decreased mobility;Decreased coordination;Decreased cognition;Decreased knowledge of use of DME;Decreased safety awareness       PT Treatment Interventions DME instruction;Gait training;Functional mobility training;Therapeutic activities;Therapeutic exercise;Balance training;Neuromuscular re-education;Patient/family education;Cognitive remediation;Wheelchair mobility training    PT Goals (Current goals can be found in the Care Plan section)  Acute Rehab PT Goals Patient Stated Goal: go home PT Goal Formulation: With patient/family Time For Goal Achievement: 03/01/17 Potential to Achieve Goals: Fair    Frequency Min 2X/week   Barriers to discharge        Co-evaluation               AM-PAC PT "6 Clicks" Daily Activity  Outcome Measure Difficulty turning over in bed (including adjusting bedclothes, sheets and blankets)?: A Little Difficulty moving from lying on back to sitting on the side of the bed? : A Little Difficulty sitting down on and standing up from a chair with arms (e.g., wheelchair, bedside commode, etc,.)?: A Little Help needed moving to and from a bed to chair (including a wheelchair)?: A Little Help needed walking in hospital room?: A Lot Help needed climbing 3-5 steps with a railing? : Total 6 Click Score: 15    End of Session Equipment Utilized During Treatment: Gait belt Activity Tolerance: Patient limited by fatigue;Patient tolerated treatment well Patient left: with call bell/phone within reach;with chair alarm set;with family/visitor present   PT Visit Diagnosis: Unsteadiness on feet (R26.81);Muscle weakness (generalized) (M62.81);Difficulty in walking, not elsewhere classified (R26.2);Ataxic gait (R26.0)    Time: 1610-9604 PT Time Calculation (min) (ACUTE ONLY): 26 min   Charges:   PT Evaluation $PT Eval Low Complexity: 1 Procedure PT Treatments $Gait  Training: 8-22 mins   PT G Codes:   PT G-Codes **NOT FOR INPATIENT CLASS** Functional Assessment Tool Used: AM-PAC 6 Clicks Basic Mobility Functional Limitation: Mobility: Walking and moving around Mobility: Walking and Moving Around Current Status (V4098): At least 60 percent but less than 80 percent impaired, limited or restricted Mobility: Walking and Moving Around Goal Status [phone]): At least 20 percent but less than 40 percent impaired, limited or restricted    Malachi Pro, DPT 02/15/2017, 2:51 PM

## 2017-02-15 NOTE — Care Management (Signed)
Admitted to this facility with the diagnosis of hypertensive urgency under observation status. Lives with wife, [name] [phone]). Last seen Dr. Maryellen PileEason 4-5 months ago. No home health. No skilled facility. N home oxygen. Rolling walker and wheelchair in the home. No falls. Good appetite. [facility]. Self feeds, needs help with baths and dressing. Daughter will transport. Physical therapy evaluation completed. Recommending home with home health and physical therapy. Discussed Home Health agencies. Chose [facility]. Will update Feliberto GottronJason Hinton, [facility] representative. Discharge to home today per Dr. Ouida SillsPatel Anthonee Gelin S Daundre Biel RN MSN CCM Care Management [phone]
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
## 2017-03-05 NOTE — Progress Notes (Signed)
[facility]  Patient Status: not taken under care, unable to locate patient. [facility], then clinician unable to get in touch to schedule visit. Terrilee CroakBrenda Holland, CM & Collie SiadAngela Johnson, CM both notified.   Scherrie GerlachJason E Hinton 03/05/2017, 10:06 AM

## 2017-04-11 ENCOUNTER — Observation Stay
Admission: EM | Admit: 2017-04-11 | Discharge: 2017-04-13 | Disposition: A | Discharge status: Home / Self Care | Payer: Medicare Other | Attending: Internal Medicine | Admitting: Internal Medicine

## 2017-04-11 ENCOUNTER — Encounter: Payer: Self-pay | Admitting: Emergency Medicine

## 2017-04-11 DIAGNOSIS — Z91013 Allergy to seafood: Secondary | ICD-10-CM | POA: Diagnosis not present

## 2017-04-11 DIAGNOSIS — G3189 Other specified degenerative diseases of nervous system: Secondary | ICD-10-CM | POA: Diagnosis not present

## 2017-04-11 DIAGNOSIS — I10 Essential (primary) hypertension: Secondary | ICD-10-CM | POA: Insufficient documentation

## 2017-04-11 DIAGNOSIS — G40909 Epilepsy, unspecified, not intractable, without status epilepticus: Secondary | ICD-10-CM | POA: Diagnosis not present

## 2017-04-11 DIAGNOSIS — R531 Weakness: Secondary | ICD-10-CM | POA: Diagnosis not present

## 2017-04-11 DIAGNOSIS — Z79899 Other long term (current) drug therapy: Secondary | ICD-10-CM | POA: Diagnosis not present

## 2017-04-11 DIAGNOSIS — Z8249 Family history of ischemic heart disease and other diseases of the circulatory system: Secondary | ICD-10-CM | POA: Diagnosis not present

## 2017-04-11 DIAGNOSIS — Z87820 Personal history of traumatic brain injury: Secondary | ICD-10-CM | POA: Diagnosis not present

## 2017-04-11 DIAGNOSIS — Z9114 Patient's other noncompliance with medication regimen: Secondary | ICD-10-CM | POA: Diagnosis not present

## 2017-04-11 DIAGNOSIS — H548 Legal blindness, as defined in USA: Secondary | ICD-10-CM | POA: Diagnosis not present

## 2017-04-11 DIAGNOSIS — R569 Unspecified convulsions: Secondary | ICD-10-CM | POA: Diagnosis not present

## 2017-04-11 DIAGNOSIS — Z8673 Personal history of transient ischemic attack (TIA), and cerebral infarction without residual deficits: Secondary | ICD-10-CM | POA: Insufficient documentation

## 2017-04-11 DIAGNOSIS — Z7982 Long term (current) use of aspirin: Secondary | ICD-10-CM | POA: Diagnosis not present

## 2017-04-11 LAB — HEPATIC FUNCTION PANEL
ALBUMIN: 3.5 g/dL (ref 3.5–5.0)
ALK PHOS: 76 U/L (ref 38–126)
ALT: 20 U/L (ref 17–63)
AST: 24 U/L (ref 15–41)
Bilirubin, Direct: <0.1 mg/dL — ABNORMAL LOW (ref 0.1–0.5)
TOTAL PROTEIN: 7.1 g/dL (ref 6.5–8.1)
Total Bilirubin: 0.8 mg/dL (ref 0.3–1.2)

## 2017-04-11 LAB — CBC WITH DIFFERENTIAL/PLATELET
BASOS ABS: 0 10*3/uL (ref 0–0.1)
Basophils Relative: 1 %
EOS PCT: 2 %
Eosinophils Absolute: 0.1 10*3/uL (ref 0–0.7)
HEMATOCRIT: 36.2 % — AB (ref 40.0–52.0)
Hemoglobin: 12.3 g/dL — ABNORMAL LOW (ref 13.0–18.0)
LYMPHS ABS: 1.4 10*3/uL (ref 1.0–3.6)
LYMPHS PCT: 24 %
MCH: 29.3 pg (ref 26.0–34.0)
MCHC: 34.1 g/dL (ref 32.0–36.0)
MCV: 85.9 fL (ref 80.0–100.0)
MONO ABS: 0.5 10*3/uL (ref 0.2–1.0)
MONOS PCT: 9 %
NEUTROS ABS: 3.8 10*3/uL (ref 1.4–6.5)
Neutrophils Relative %: 64 %
PLATELETS: 195 10*3/uL (ref 150–440)
RBC: 4.21 MIL/uL — ABNORMAL LOW (ref 4.40–5.90)
RDW: 13.8 % (ref 11.5–14.5)
WBC: 5.9 10*3/uL (ref 3.8–10.6)

## 2017-04-11 LAB — BASIC METABOLIC PANEL
ANION GAP: 6 (ref 5–15)
BUN: 21 mg/dL — AB (ref 6–20)
CHLORIDE: 107 mmol/L (ref 101–111)
CO2: 27 mmol/L (ref 22–32)
Calcium: 9.3 mg/dL (ref 8.9–10.3)
Creatinine, Ser: 1.51 mg/dL — ABNORMAL HIGH (ref 0.61–1.24)
GFR calc Af Amer: 56 mL/min — ABNORMAL LOW (ref >60)
GFR, EST NON AFRICAN AMERICAN: 49 mL/min — AB (ref >60)
Glucose, Bld: 124 mg/dL — ABNORMAL HIGH (ref 65–99)
POTASSIUM: 3.7 mmol/L (ref 3.5–5.1)
SODIUM: 140 mmol/L (ref 135–145)

## 2017-04-11 MED ORDER — LORAZEPAM 2 MG/ML IJ SOLN
1.0000 mg | Freq: Once | INTRAMUSCULAR | Status: AC
  Administered 2017-04-11: 1 mg via INTRAVENOUS
  Filled 2017-04-11: qty 1

## 2017-04-11 MED ORDER — SODIUM CHLORIDE 0.9 % IV SOLN
1360.0000 mg | Freq: Once | INTRAVENOUS | Status: AC
  Administered 2017-04-12: 1360 mg via INTRAVENOUS
  Filled 2017-04-11: qty 27.2

## 2017-04-11 MED ORDER — SODIUM CHLORIDE 0.9 % IV SOLN
15.0000 mg/kg | Freq: Once | INTRAVENOUS | Status: DC
  Filled 2017-04-11: qty 27.21

## 2017-04-11 MED ORDER — SODIUM CHLORIDE 0.9 % IV BOLUS (SEPSIS)
500.0000 mL | Freq: Once | INTRAVENOUS | Status: AC
  Administered 2017-04-11: 500 mL via INTRAVENOUS

## 2017-04-11 NOTE — ED Provider Notes (Signed)
[facility] Emergency Department Provider Note   ____________________________________________   First MD Initiated Contact with Patient 04/11/17 2318     (approximate)  I have reviewed the triage vital signs and the nursing notes.   HISTORY  Chief Complaint Seizures (Pt. here via EMS for Elevated BP and possible seizure) and Hypertension    HPI [name] is a 61 y.o. male who comes into the hospital today with a seizure. 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 He typically takes Dilantin but he has not taken his medication approximately 2 or 3 days. They report that they're unable to afford his medications. They have also been giving him intermittent blood pressure medications. They report that tonight he was throwing his head back around 10 PM which is what his seizures typically look like. He was speaking to the family prior to leaving the house but they report that now he seems to not be talking his normal. That is not uncommon for him as well after he has a seizure. The family is unsure if he may have had another seizure on his way here. The seizure lasted about 5 minutes. He is not having any pain at this time. The family brought him in today for evaluation.   Past Medical History:  Diagnosis Date  . Hypertension   . Seizures (HCC)   . Stroke [facility](HCC)     Patient Active Problem List   Diagnosis Date Noted  . Seizure (HCC) 04/12/2017  . Hypertensive urgency 02/14/2017    Past Surgical History:  Procedure Laterality Date  . CLOSED REDUCTION CRANIOFACIAL SEPARATION    . LUNG REMOVAL, PARTIAL Right     Prior to Admission medications   Medication Sig Start Date End Date Taking? Authorizing Provider  aspirin EC 81 MG EC tablet Take 1 tablet (81 mg total) by mouth daily. 02/16/17  Yes Auburn BilberryPatel, Shreyang, MD  atorvastatin (LIPITOR) 40 MG tablet Take 1 tablet (40 mg total) by mouth daily at 6  PM. 02/15/17  Yes Auburn BilberryPatel, Shreyang, MD  cloNIDine (CATAPRES) 0.2 MG tablet Take 1 tablet (0.2 mg total) by mouth 2 (two) times daily. 02/15/17  Yes Auburn BilberryPatel, Shreyang, MD  omeprazole (PRILOSEC) 20 MG capsule Take 40 mg by mouth daily.   Yes [provider]  phenytoin (DILANTIN) 100 MG ER capsule Take 400 mg by mouth daily.   Yes [provider]  polyethylene glycol (MIRALAX / GLYCOLAX) packet Take 17 g by mouth daily. 02/15/17  Yes Auburn BilberryPatel, Shreyang, MD    Allergies Shellfish allergy  Family History  Problem Relation Age of Onset  . Hypertension Mother   . CAD Mother     Social History Social History  Substance Use Topics  . Smoking status: Former Games developermoker  . Smokeless tobacco: Never Used  . Alcohol use No    Review of Systems  Constitutional: No fever/chills Eyes: No visual changes. ENT: No sore throat. Cardiovascular: Denies chest pain. Respiratory: Denies shortness of breath. Gastrointestinal: No abdominal pain.  No nausea, no vomiting.  No diarrhea.  No constipation. Genitourinary: Negative for dysuria. Musculoskeletal: Negative for back pain. Skin: Negative for rash. Neurological: Seizure, slurred speech   ____________________________________________   PHYSICAL EXAM:  VITAL SIGNS: ED Triage Vitals  Enc Vitals Group     BP 04/11/17 2244 (!) 155/117     Pulse Rate 04/11/17 2244 80     Resp 04/11/17 2244 (!) 22  Temp 04/11/17 2244 98.1 F (36.7 C)     Temp Source 04/11/17 2244 Oral     SpO2 04/11/17 2244 100 %     Weight 04/11/17 2245 200 lb (90.7 kg)     Height 04/11/17 2245 6\' 8"  (2.032 m)     Head Circumference --      Peak Flow --      Pain Score --      Pain Loc --      Pain Edu? --      Excl. in GC? --     Constitutional: Alert and oriented. Well appearing and in no acute distress. Eyes: Legally blind with scars over irises.  Head: Atraumatic. Nose: No congestion/rhinnorhea. Mouth/Throat: Mucous membranes are moist.  Oropharynx  non-erythematous. Cardiovascular: Normal rate, regular rhythm. Grossly normal heart sounds.  Good peripheral circulation. Respiratory: Normal respiratory effort.  No retractions. Lungs CTAB. Gastrointestinal: Soft and nontender. No distention. Active bowel sounds Musculoskeletal: No lower extremity tenderness nor edema.   Neurologic:  Garbled speech with some difficulty in understanding, different according to family. Cranial nerves II through XII are grossly intact aside from visual acuity and extraocular movement. Strength 5 out of 5 in upper and lower extremity is. Skin:  Skin is warm, dry and intact.  Psychiatric: Mood and affect are normal.   ____________________________________________   LABS (all labs ordered are listed, but only abnormal results are displayed)  Labs Reviewed  BASIC METABOLIC PANEL - Abnormal; Notable for the following:       Result Value   Glucose, Bld 124 (*)    BUN 21 (*)    Creatinine, Ser 1.51 (*)    GFR calc non Af Amer 49 (*)    GFR calc Af Amer 56 (*)    All other components within normal limits  HEPATIC FUNCTION PANEL - Abnormal; Notable for the following:    Bilirubin, Direct <0.1 (*)    All other components within normal limits  CBC WITH DIFFERENTIAL/PLATELET - Abnormal; Notable for the following:    RBC 4.21 (*)    Hemoglobin 12.3 (*)    HCT 36.2 (*)    All other components within normal limits  PHENYTOIN LEVEL, TOTAL - Abnormal; Notable for the following:    Phenytoin Lvl <2.5 (*)    All other components within normal limits  URINALYSIS, COMPLETE (UACMP) WITH MICROSCOPIC   ____________________________________________  EKG  none ____________________________________________  RADIOLOGY  Ct Head Wo Contrast  Result Date: 04/12/2017 CLINICAL DATA:  Hypertension and seizure like activity. EXAM: CT HEAD WITHOUT CONTRAST TECHNIQUE: Contiguous axial images were obtained from the base of the skull through the vertex without intravenous  contrast. COMPARISON:  Head CT 02/13/2017 FINDINGS: Brain: Brain is partially obscured by streak artifact from multiple metallic foreign bodies in the scalp. There is periventricular hypoattenuation compatible with chronic microvascular disease. No acute hemorrhage. No midline shift or mass effect. Vascular: No hyperdense vessel or unexpected calcification. Skull: Multiple metallic foreign bodies in the scalp soft tissues. No skull fracture. Sinuses/Orbits: Paranasal sinuses and mastoids are clear. Bilateral atrophic, partially calcified globes. Other: None. IMPRESSION: 1. Examination degraded by streak artifact from numerous metallic foreign bodies in the scalp. Within that limitation, no acute intracranial abnormality. 2. Chronic atrophy and microvascular ischemia. Electronically Signed   By: Deatra Robinson M.D.   On: 04/12/2017 01:35    ____________________________________________   PROCEDURES  Procedure(s) performed: None  Procedures  Critical Care performed: No  ____________________________________________   INITIAL IMPRESSION / ASSESSMENT AND  PLAN / ED COURSE  Pertinent labs & imaging results that were available during my care of the patient were reviewed by me and considered in my medical decision making (see chart for details).  This is a 78-year-old male who comes into the hospital today with a seizure. The patient has not been taking his medication because he ran out and does not have the money to afford it. The patient is here today for evaluation. I did check some blood work and gave the patient a dose of Ativan as well as some fosphenytoin. The patient's family did come out saying that he just had a few seizures after it occurred. They report that it lasted anywhere from seconds to minutes and he just rolled his eyes and his head and became minimally responsive. When I did go in to check on the patient both times he was awake and answering questions. I did perform a CT scan of the  patient's head as he was having these episodes. He did receive 15 phenytoin equivalents per kilogram of fosphenytoin and Ativan. I also gave the patient a 500, bolus of normal saline. Given the frequency of his seizures according to the family I will admit the patient to the hospitalist service. The patient is at his baseline at this time.      ____________________________________________   FINAL CLINICAL IMPRESSION(S) / ED DIAGNOSES  Final diagnoses:  Seizure (HCC)      NEW MEDICATIONS STARTED DURING THIS VISIT:  New Prescriptions   No medications on file     Note:  This document was prepared using Dragon voice recognition software and may include unintentional dictation errors.    Rebecka Apley, MD 04/12/17 [phone]

## 2017-04-11 NOTE — ED Triage Notes (Signed)
Family reports seizure like activity today.  Pt. Has not taken daily medication for the past 3 days.  EMS also report elevated blood pressure.

## 2017-04-11 NOTE — ED Notes (Signed)
Family reports seizure like activity today.  Family reports pt. Has hx of seizures.  Pt. Has been out of medications for the past couple days.

## 2017-04-12 ENCOUNTER — Emergency Department: Payer: Medicare Other

## 2017-04-12 ENCOUNTER — Encounter: Payer: Self-pay | Admitting: Internal Medicine

## 2017-04-12 DIAGNOSIS — G40909 Epilepsy, unspecified, not intractable, without status epilepticus: Secondary | ICD-10-CM | POA: Diagnosis not present

## 2017-04-12 DIAGNOSIS — R569 Unspecified convulsions: Secondary | ICD-10-CM | POA: Diagnosis not present

## 2017-04-12 DIAGNOSIS — Z8673 Personal history of transient ischemic attack (TIA), and cerebral infarction without residual deficits: Secondary | ICD-10-CM | POA: Diagnosis not present

## 2017-04-12 DIAGNOSIS — I10 Essential (primary) hypertension: Secondary | ICD-10-CM | POA: Diagnosis not present

## 2017-04-12 DIAGNOSIS — E860 Dehydration: Secondary | ICD-10-CM | POA: Diagnosis not present

## 2017-04-12 LAB — URINALYSIS, COMPLETE (UACMP) WITH MICROSCOPIC
BILIRUBIN URINE: NEGATIVE
Glucose, UA: NEGATIVE mg/dL
HGB URINE DIPSTICK: NEGATIVE
Ketones, ur: NEGATIVE mg/dL
LEUKOCYTES UA: NEGATIVE
Nitrite: POSITIVE — AB
PROTEIN: NEGATIVE mg/dL
RBC / HPF: NONE SEEN RBC/hpf (ref 0–5)
Specific Gravity, Urine: 1.011 (ref 1.005–1.030)
pH: 7 (ref 5.0–8.0)

## 2017-04-12 LAB — BASIC METABOLIC PANEL
Anion gap: 6 (ref 5–15)
BUN: 21 mg/dL — AB (ref 6–20)
CALCIUM: 9.2 mg/dL (ref 8.9–10.3)
CO2: 26 mmol/L (ref 22–32)
CREATININE: 1.22 mg/dL (ref 0.61–1.24)
Chloride: 108 mmol/L (ref 101–111)
Glucose, Bld: 101 mg/dL — ABNORMAL HIGH (ref 65–99)
Potassium: 3.7 mmol/L (ref 3.5–5.1)
SODIUM: 140 mmol/L (ref 135–145)

## 2017-04-12 LAB — CBC
HCT: 35.2 % — ABNORMAL LOW (ref 40.0–52.0)
HEMOGLOBIN: 12 g/dL — AB (ref 13.0–18.0)
MCH: 29.4 pg (ref 26.0–34.0)
MCHC: 34.1 g/dL (ref 32.0–36.0)
MCV: 86 fL (ref 80.0–100.0)
PLATELETS: 168 10*3/uL (ref 150–440)
RBC: 4.09 MIL/uL — ABNORMAL LOW (ref 4.40–5.90)
RDW: 13.9 % (ref 11.5–14.5)
WBC: 6.3 10*3/uL (ref 3.8–10.6)

## 2017-04-12 LAB — PHENYTOIN LEVEL, TOTAL: Phenytoin Lvl: <2.5 ug/mL — ABNORMAL LOW (ref 10.0–20.0)

## 2017-04-12 MED ORDER — SENNOSIDES-DOCUSATE SODIUM 8.6-50 MG PO TABS: 1.0000 | ORAL_TABLET | Freq: Every evening | ORAL | Status: DC | PRN

## 2017-04-12 MED ORDER — ENOXAPARIN SODIUM 40 MG/0.4ML ~~LOC~~ SOLN
40.0000 mg | SUBCUTANEOUS | Status: DC
  Administered 2017-04-12 – 2017-04-13 (×2): 40 mg via SUBCUTANEOUS
  Filled 2017-04-12 (×2): qty 0.4

## 2017-04-12 MED ORDER — METOPROLOL SUCCINATE ER 25 MG PO TB24
12.5000 mg | ORAL_TABLET | Freq: Every day | ORAL | Status: DC
  Administered 2017-04-12: 12.5 mg via ORAL
  Filled 2017-04-12: qty 1

## 2017-04-12 MED ORDER — ACETAMINOPHEN 325 MG PO TABS: 650.0000 mg | ORAL_TABLET | Freq: Four times a day (QID) | ORAL | Status: DC | PRN

## 2017-04-12 MED ORDER — ASPIRIN EC 81 MG PO TBEC
81.0000 mg | DELAYED_RELEASE_TABLET | Freq: Every day | ORAL | Status: DC
  Administered 2017-04-12 – 2017-04-13 (×2): 81 mg via ORAL
  Filled 2017-04-12 (×2): qty 1

## 2017-04-12 MED ORDER — MIRTAZAPINE 15 MG PO TABS
45.0000 mg | ORAL_TABLET | Freq: Every day | ORAL | Status: DC
  Administered 2017-04-12: 22:00:00 45 mg via ORAL
  Filled 2017-04-12: qty 3

## 2017-04-12 MED ORDER — LABETALOL HCL 100 MG PO TABS
300.0000 mg | ORAL_TABLET | Freq: Three times a day (TID) | ORAL | Status: DC
  Administered 2017-04-12: 09:00:00 300 mg via ORAL
  Filled 2017-04-12 (×3): qty 1

## 2017-04-12 MED ORDER — HYDROCHLOROTHIAZIDE 25 MG PO TABS
25.0000 mg | ORAL_TABLET | Freq: Every day | ORAL | Status: DC
  Administered 2017-04-13: 25 mg via ORAL
  Filled 2017-04-12: qty 1

## 2017-04-12 MED ORDER — ACETAMINOPHEN 650 MG RE SUPP: 650.0000 mg | Freq: Four times a day (QID) | RECTAL | Status: DC | PRN

## 2017-04-12 MED ORDER — PHENYTOIN SODIUM EXTENDED 100 MG PO CAPS
400.0000 mg | ORAL_CAPSULE | Freq: Every day | ORAL | Status: DC
  Administered 2017-04-12: 09:00:00 400 mg via ORAL
  Filled 2017-04-12: qty 4

## 2017-04-12 MED ORDER — HYDRALAZINE HCL 20 MG/ML IJ SOLN
10.0000 mg | INTRAMUSCULAR | Status: DC | PRN
  Administered 2017-04-12: 10 mg via INTRAVENOUS

## 2017-04-12 MED ORDER — ONDANSETRON HCL 4 MG PO TABS: 4.0000 mg | ORAL_TABLET | Freq: Four times a day (QID) | ORAL | Status: DC | PRN

## 2017-04-12 MED ORDER — PHENYTOIN SODIUM EXTENDED 100 MG PO CAPS
400.0000 mg | ORAL_CAPSULE | Freq: Every day | ORAL | Status: DC
  Filled 2017-04-12: qty 4

## 2017-04-12 MED ORDER — ATORVASTATIN CALCIUM 20 MG PO TABS
40.0000 mg | ORAL_TABLET | Freq: Every day | ORAL | Status: DC
Start: 2017-04-12 — End: 2017-04-13
  Administered 2017-04-12: 18:00:00 40 mg via ORAL
  Filled 2017-04-12: qty 2

## 2017-04-12 MED ORDER — HYDRALAZINE HCL 20 MG/ML IJ SOLN
INTRAMUSCULAR | Status: AC
  Filled 2017-04-12: qty 1

## 2017-04-12 MED ORDER — SODIUM CHLORIDE 0.9 % IV SOLN
INTRAVENOUS | Status: DC
  Administered 2017-04-12 – 2017-04-13 (×2): via INTRAVENOUS

## 2017-04-12 MED ORDER — ONDANSETRON HCL 4 MG/2ML IJ SOLN: 4.0000 mg | Freq: Four times a day (QID) | INTRAMUSCULAR | Status: DC | PRN

## 2017-04-12 MED ORDER — LORAZEPAM 2 MG/ML IJ SOLN
2.0000 mg | INTRAMUSCULAR | Status: DC | PRN
  Administered 2017-04-12: 2 mg via INTRAVENOUS

## 2017-04-12 MED ORDER — HYDRALAZINE HCL 50 MG PO TABS
100.0000 mg | ORAL_TABLET | Freq: Four times a day (QID) | ORAL | Status: DC
  Administered 2017-04-12: 100 mg via ORAL
  Filled 2017-04-12: qty 2

## 2017-04-12 MED ORDER — PANTOPRAZOLE SODIUM 40 MG PO TBEC
40.0000 mg | DELAYED_RELEASE_TABLET | Freq: Every day | ORAL | Status: DC
  Administered 2017-04-12 – 2017-04-13 (×2): 40 mg via ORAL
  Filled 2017-04-12 (×2): qty 1

## 2017-04-12 MED ORDER — POLYETHYLENE GLYCOL 3350 17 G PO PACK
17.0000 g | PACK | Freq: Every day | ORAL | Status: DC
  Administered 2017-04-12 – 2017-04-13 (×2): 17 g via ORAL
  Filled 2017-04-12 (×2): qty 1

## 2017-04-12 MED ORDER — LORAZEPAM 2 MG/ML IJ SOLN
INTRAMUSCULAR | Status: AC
  Filled 2017-04-12: qty 1

## 2017-04-12 MED ORDER — CLONIDINE HCL 0.1 MG PO TABS
0.2000 mg | ORAL_TABLET | Freq: Two times a day (BID) | ORAL | Status: DC
Start: 2017-04-12 — End: 2017-04-13
  Administered 2017-04-12 – 2017-04-13 (×3): 0.2 mg via ORAL
  Filled 2017-04-12 (×3): qty 2

## 2017-04-12 MED ORDER — BUSPIRONE HCL 5 MG PO TABS
15.0000 mg | ORAL_TABLET | Freq: Two times a day (BID) | ORAL | Status: DC
  Administered 2017-04-12: 09:00:00 15 mg via ORAL
  Filled 2017-04-12 (×2): qty 1

## 2017-04-12 NOTE — Progress Notes (Signed)
[place] and allergy armbands in place

## 2017-04-12 NOTE — Progress Notes (Addendum)
[facility] - [place] at [facility]   PATIENT NAME: [name]    MR#:  [id number]  DATE OF BIRTH:  11/25/1955  SUBJECTIVE:  Patient came in after having seizures secondary to not able to take medications which he ran out of Dilantin 2 days ago.  REVIEW OF SYSTEMS:   Review of Systems  Constitutional: Negative for chills, fever and weight loss.  HENT: Negative for ear discharge, ear pain and nosebleeds.   Eyes: Negative for blurred vision, pain and discharge.  Respiratory: Negative for sputum production, shortness of breath, wheezing and stridor.   Cardiovascular: Negative for chest pain, palpitations, orthopnea and PND.  Gastrointestinal: Negative for abdominal pain, diarrhea, nausea and vomiting.  Genitourinary: Negative for frequency and urgency.  Musculoskeletal: Negative for back pain and joint pain.  Neurological: Positive for weakness. Negative for sensory change, speech change and focal weakness.  Psychiatric/Behavioral: Negative for depression and hallucinations. The patient is not nervous/anxious.    Tolerating Diet: Tolerating PT:   DRUG ALLERGIES:   Allergies  Allergen Reactions  . Shellfish Allergy Hives and Swelling    VITALS:  Blood pressure 110/65, pulse 67, temperature (!) 97.5 F (36.4 C), temperature source Oral, resp. rate 20, height 6\' 8"  (2.032 m), weight 90.7 kg (200 lb), SpO2 100 %.  PHYSICAL EXAMINATION:   Physical Exam  GENERAL:  61 y.o.-year-old patient lying in the bed with no acute distress.  EYESBilateral corneal opacity No scleral icterus. Extraocular muscles intact.  HEENT: Head atraumatic, normocephalic. Oropharynx and nasopharynx clear.  NECK:  Supple, no jugular venous distention. No thyroid enlargement, no tenderness.  LUNGS: Normal breath sounds bilaterally, no wheezing, rales, rhonchi. No use of accessory muscles of respiration.  CARDIOVASCULAR: S1, S2 normal. No murmurs, rubs, or gallops.  ABDOMEN: Soft,  nontender, nondistended. Bowel sounds present. No organomegaly or mass.  EXTREMITIES: No cyanosis, clubbing or edema b/l.    NEUROLOGIC:  No focal Motor or sensory deficits b/l.  Grossly nonfocal PSYCHIATRIC:  patient is alert and oriented x 3.  SKIN: No obvious rash, lesion, or ulcer.   LABORATORY PANEL:  CBC  Recent Labs Lab 04/12/17 0640  WBC 6.3  HGB 12.0*  HCT 35.2*  PLT 168    Chemistries   Recent Labs Lab 04/11/17 2239 04/12/17 0640  NA 140 140  K 3.7 3.7  CL 107 108  CO2 27 26  GLUCOSE 124* 101*  BUN 21* 21*  CREATININE 1.51* 1.22  CALCIUM 9.3 9.2  AST 24  --   ALT 20  --   ALKPHOS 76  --   BILITOT 0.8  --    Cardiac Enzymes No results for input(s): TROPONINI in the last 168 hours. RADIOLOGY:  Ct Head Wo Contrast  Result Date: 04/12/2017 CLINICAL DATA:  Hypertension and seizure like activity. EXAM: CT HEAD WITHOUT CONTRAST TECHNIQUE: Contiguous axial images were obtained from the base of the skull through the vertex without intravenous contrast. COMPARISON:  Head CT 02/13/2017 FINDINGS: Brain: Brain is partially obscured by streak artifact from multiple metallic foreign bodies in the scalp. There is periventricular hypoattenuation compatible with chronic microvascular disease. No acute hemorrhage. No midline shift or mass effect. Vascular: No hyperdense vessel or unexpected calcification. Skull: Multiple metallic foreign bodies in the scalp soft tissues. No skull fracture. Sinuses/Orbits: Paranasal sinuses and mastoids are clear. Bilateral atrophic, partially calcified globes. Other: None. IMPRESSION: 1. Examination degraded by streak artifact from numerous metallic foreign bodies in the scalp. Within that limitation, no acute intracranial  abnormality. 2. Chronic atrophy and microvascular ischemia. Electronically Signed   By: Deatra RobinsonKevin  Herman M.D.   On: 04/12/2017 01:35   ASSESSMENT AND PLAN:   [name] is a 61 y.o. male who comes into the hospital today with a  seizure. 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 He typically takes Dilantin but he has not taken his medication approximately 2 or 3 days. They report that they're unable to afford his medications. They have also been giving him intermittent blood pressure medications.  1. Breakthrough seizures due to missing Dilantin for about 2 days -IV fospheytoin loading dose given -resume Dilantin 400 mg po qhs -seizure precautions -seen by Neurology. No new rec   2.Malignant HTN -metoprolol bid, HCTZ, clonidine and prn hydralazine -pt not taking meds properly---some financial constraint. -will have CM look into it  3. TBI with legally blind since [id number] Wife cares for him  4. DVT prophylaxis with lovenox  Case discussed with Care Management/Social Worker. Management plans discussed with the patient, family and they are in agreement.  CODE STATUS: FULL  DVT Prophylaxis: lovenox  TOTAL TIME TAKING CARE OF THIS PATIENT: 25minutes.  >50% time spent on counselling and coordination of care pt, wife and Dr Thad Rangerreynolds  POSSIBLE D/C IN **1 DAYS, DEPENDING ON CLINICAL CONDITION.  Note: This dictation was prepared with Dragon dictation along with smaller phrase technology. Any transcriptional errors that result from this process are unintentional.  Williette Loewe M.D on 04/12/2017 at 2:07 PM  Between 7am to 6pm - Pager - [phone]  After 6pm go to [email or web address] - Social research officer, [email or web address] [email or web address]  [facility] [place] [facility]  Office  [phone]  CC: Primary care physician; [name], Ernest B, MD

## 2017-04-12 NOTE — H&P (Signed)
[facility] - [place] at [facility]   PATIENT NAME: [name]    MR#:  [id number]  DATE OF BIRTH:  1955/12/21  DATE OF ADMISSION:  04/11/2017  PRIMARY CARE PHYSICIAN: Derwood Kaplan, MD   REQUESTING/REFERRING PHYSICIAN:   CHIEF COMPLAINT:   Chief Complaint  Patient presents with  . Seizures    Pt. here via EMS for Elevated BP and possible seizure  . Hypertension    HISTORY OF PRESENT ILLNESS: [name]  is a 61 y.o. male with a known history of Hypertension, seizure disorder, CVA presented to the emergency room for seizure. According to patient's wife patient did not take Dilantin for 2 days. He missed his medication because he ran out of the medication. He presented to the emergency room after he had seizures. Patient was given IV fosphenytoin  1360 mg in the emergency room. No new episodes of seizures were noted in the emergency room. Blood pressure was also elevated when patient presented to the emergency room. No history of fall and head injury. Patient has blindness in both eyes. He is taken care of by his wife at home. He is dependent on activities of daily living. He was worked up with CT head which showed no acute intracranial abnormality. Hospitalist service was consulted for further care of the patient.  PAST MEDICAL HISTORY:   Past Medical History:  Diagnosis Date  . Hypertension   . Seizures (HCC)   . Stroke [facility])     PAST SURGICAL HISTORY: Past Surgical History:  Procedure Laterality Date  . CLOSED REDUCTION CRANIOFACIAL SEPARATION    . LUNG REMOVAL, PARTIAL Right     SOCIAL HISTORY:  Social History  Substance Use Topics  . Smoking status: Former Games developer  . Smokeless tobacco: Never Used  . Alcohol use No    FAMILY HISTORY:  Family History  Problem Relation Age of Onset  . Hypertension Mother   . CAD Mother     DRUG ALLERGIES:  Allergies  Allergen Reactions  . Shellfish Allergy Hives and Swelling    REVIEW OF SYSTEMS:    CONSTITUTIONAL: No fever, fatigue or weakness.  EYES: Is blind in both eyes EARS, NOSE, AND THROAT: No tinnitus or ear pain.  RESPIRATORY: No cough, shortness of breath, wheezing or hemoptysis.  CARDIOVASCULAR: No chest pain, orthopnea, edema.  GASTROINTESTINAL: No nausea, vomiting, diarrhea or abdominal pain.  GENITOURINARY: No dysuria, hematuria.  ENDOCRINE: No polyuria, nocturia,  HEMATOLOGY: No anemia, easy bruising or bleeding SKIN: No rash or lesion. MUSCULOSKELETAL: No joint pain or arthritis.   NEUROLOGIC: No tingling, numbness, weakness.  Had seizure PSYCHIATRY: No anxiety or depression.   MEDICATIONS AT HOME:  Prior to Admission medications   Medication Sig Start Date End Date Taking? Authorizing Provider  aspirin EC 81 MG EC tablet Take 1 tablet (81 mg total) by mouth daily. 02/16/17  Yes Auburn Bilberry, MD  atorvastatin (LIPITOR) 40 MG tablet Take 1 tablet (40 mg total) by mouth daily at 6 PM. 02/15/17  Yes Auburn Bilberry, MD  cloNIDine (CATAPRES) 0.2 MG tablet Take 1 tablet (0.2 mg total) by mouth 2 (two) times daily. 02/15/17  Yes Auburn Bilberry, MD  omeprazole (PRILOSEC) 20 MG capsule Take 40 mg by mouth daily.   Yes [provider]  phenytoin (DILANTIN) 100 MG ER capsule Take 400 mg by mouth daily.   Yes [provider]  polyethylene glycol (MIRALAX / GLYCOLAX) packet Take 17 g by mouth daily. 02/15/17  Yes Auburn Bilberry,  MD      PHYSICAL EXAMINATION:   VITAL SIGNS: Blood pressure (!) 196/118, pulse 66, temperature 98.1 F (36.7 C), temperature source Oral, resp. rate 17, height 6\' 8"  (2.032 m), weight 90.7 kg (200 lb), SpO2 100 %.  GENERAL:  61 y.o.-year-old patient lying in the bed with no acute distress.  EYES: Blind in both eyes. No scleral icterus. Extraocular muscles intact.  HEENT: Head atraumatic, normocephalic. Oropharynx and nasopharynx clear.  NECK:  Supple, no jugular venous distention. No thyroid enlargement, no tenderness.   LUNGS: Normal breath sounds bilaterally, no wheezing, rales,rhonchi or crepitation. No use of accessory muscles of respiration.  CARDIOVASCULAR: S1, S2 normal. No murmurs, rubs, or gallops.  ABDOMEN: Soft, nontender, nondistended. Bowel sounds present. No organomegaly or mass.  EXTREMITIES: No pedal edema, cyanosis, or clubbing.  NEUROLOGIC: Cranial nerves II through XII are intact. Muscle strength 5/5 in all extremities. Sensation intact. Gait not checked.  PSYCHIATRIC: The patient is alert and oriented x 3.  SKIN: No obvious rash, lesion, or ulcer.   LABORATORY PANEL:   CBC  Recent Labs Lab 04/11/17 2239  WBC 5.9  HGB 12.3*  HCT 36.2*  PLT 195  MCV 85.9  MCH 29.3  MCHC 34.1  RDW 13.8  LYMPHSABS 1.4  MONOABS 0.5  EOSABS 0.1  BASOSABS 0.0   ------------------------------------------------------------------------------------------------------------------  Chemistries   Recent Labs Lab 04/11/17 2239  NA 140  K 3.7  CL 107  CO2 27  GLUCOSE 124*  BUN 21*  CREATININE 1.51*  CALCIUM 9.3  AST 24  ALT 20  ALKPHOS 76  BILITOT 0.8   ------------------------------------------------------------------------------------------------------------------ estimated creatinine Jerry is 66.7 mL/min (A) (by C-G formula based on SCr of 1.51 mg/dL (H)). ------------------------------------------------------------------------------------------------------------------ No results for input(s): TSH, T4TOTAL, T3FREE, THYROIDAB in the last 72 hours.  Invalid input(s): FREET3   Coagulation profile No results for input(s): INR, PROTIME in the last 168 hours. ------------------------------------------------------------------------------------------------------------------- No results for input(s): DDIMER in the last 72 hours. -------------------------------------------------------------------------------------------------------------------  Cardiac Enzymes No results for input(s):  CKMB, TROPONINI, MYOGLOBIN in the last 168 hours.  Invalid input(s): CK ------------------------------------------------------------------------------------------------------------------ Invalid input(s): POCBNP  ---------------------------------------------------------------------------------------------------------------  Urinalysis    Component Value Date/Time   COLORURINE Yellow 10/11/2013 1047   APPEARANCEUR Clear 10/11/2013 1047   LABSPEC 1.018 10/11/2013 1047   PHURINE 6.0 10/11/2013 1047   GLUCOSEU Negative 10/11/2013 1047   HGBUR Negative 10/11/2013 1047   BILIRUBINUR Negative 10/11/2013 1047   KETONESUR Negative 10/11/2013 1047   PROTEINUR 30 mg/dL 16/10/960401/17/2015 [id number]   NITRITE Negative 10/11/2013 1047   LEUKOCYTESUR Negative 10/11/2013 1047     RADIOLOGY: Ct Head Wo Contrast  Result Date: 04/12/2017 CLINICAL DATA:  Hypertension and seizure like activity. EXAM: CT HEAD WITHOUT CONTRAST TECHNIQUE: Contiguous axial images were obtained from the base of the skull through the vertex without intravenous contrast. COMPARISON:  Head CT 02/13/2017 FINDINGS: Brain: Brain is partially obscured by streak artifact from multiple metallic foreign bodies in the scalp. There is periventricular hypoattenuation compatible with chronic microvascular disease. No acute hemorrhage. No midline shift or mass effect. Vascular: No hyperdense vessel or unexpected calcification. Skull: Multiple metallic foreign bodies in the scalp soft tissues. No skull fracture. Sinuses/Orbits: Paranasal sinuses and mastoids are clear. Bilateral atrophic, partially calcified globes. Other: None. IMPRESSION: 1. Examination degraded by streak artifact from numerous metallic foreign bodies in the scalp. Within that limitation, no acute intracranial abnormality. 2. Chronic atrophy and microvascular ischemia. Electronically Signed   By: Deatra RobinsonKevin  Herman M.D.   On: 04/12/2017 01:35  EKG: Orders placed or performed during the  hospital encounter of 04/11/17  . ED EKG  . ED EKG    IMPRESSION AND PLAN: 61 year old male patient with history of seizure disorder, CVA, hypertension presented to the emergency room with seizure. Admitting diagnosis 1. Breakthrough seizure 2. Uncontrolled hypertension 3. History of CVA 4. Dehydration Treatment plan Admit patient to medical floor When necessary Ativan IV for seizures IV fluid hydration Resume antiepileptic medication Neurology consultation for adjustment of seizure medication Control blood pressure with clonidine and when necessary hydralazine   All the records are reviewed and case discussed with ED provider. Management plans discussed with the patient, family and they are in agreement.  CODE STATUS:FULL CODE Surrogate Decision Maker : Wife Code Status History    Date Active Date Inactive Code Status Order ID Comments User Context   02/14/2017  3:50 AM 02/15/2017  8:09 PM Full Code [id number]  [name], MD ED       TOTAL TIME TAKING CARE OF THIS PATIENT: 51 minutes.    Ihor Austin M.D on 04/12/2017 at 2:57 AM  Between 7am to 6pm - Pager - [phone]  After 6pm go to [email or web address] - password [email or web address] [facility]  [place] [facility]  Office  [phone]  CC: Primary care physician; Derwood Kaplan, MD

## 2017-04-12 NOTE — Consult Note (Addendum)
Reason for Consult:Seizure Referring Physician: Allena Katz  CC: Seizure  HPI: [name] is an 61 y.o. male with a history of stroke and seizures.  Patient maintained on Dilantin and per wife has had good  Control on Dilantin.  Was unable to get his medication for the past two days,  On yesterday was noted to have a seizure and was brought in for evaluation  Received Ativan.  Dilantin level less than 2.5.    Past Medical History:  Diagnosis Date  . Hypertension   . Seizures (HCC)   . Stroke [facility])     Past Surgical History:  Procedure Laterality Date  . CLOSED REDUCTION CRANIOFACIAL SEPARATION    . LUNG REMOVAL, PARTIAL Right     Family History  Problem Relation Age of Onset  . Hypertension Mother   . CAD Mother     Social History:  reports that he has quit smoking. He has never used smokeless tobacco. He reports that he does not drink alcohol or use drugs.  Allergies  Allergen Reactions  . Shellfish Allergy Hives and Swelling    Medications:  I have reviewed the patient's current medications. Prior to Admission:  Prescriptions Prior to Admission  Medication Sig Dispense Refill Last Dose  . aspirin EC 81 MG EC tablet Take 1 tablet (81 mg total) by mouth daily.   04/11/2017 at Unknown time  . atorvastatin (LIPITOR) 40 MG tablet Take 1 tablet (40 mg total) by mouth daily at 6 PM. 30 tablet 0 04/11/2017 at Unknown time  . cloNIDine (CATAPRES) 0.2 MG tablet Take 1 tablet (0.2 mg total) by mouth 2 (two) times daily. 60 tablet 11 04/11/2017 at Unknown time  . omeprazole (PRILOSEC) 20 MG capsule Take 40 mg by mouth daily.   04/11/2017 at Unknown time  . phenytoin (DILANTIN) 100 MG ER capsule Take 400 mg by mouth daily.   04/11/2017 at Unknown time  . polyethylene glycol (MIRALAX / GLYCOLAX) packet Take 17 g by mouth daily. 14 each 0    Scheduled: . aspirin EC  81 mg Oral Daily  . atorvastatin  40 mg Oral q1800  . cloNIDine  0.2 mg Oral BID  . enoxaparin (LOVENOX) injection  40 mg  Subcutaneous Q24H  . [START ON 04/13/2017] hydrochlorothiazide  25 mg Oral Daily  . metoprolol succinate  12.5 mg Oral Daily  . mirtazapine  45 mg Oral QHS  . pantoprazole  40 mg Oral Daily  . phenytoin  400 mg Oral Daily  . polyethylene glycol  17 g Oral Daily    ROS: History obtained from wife  General ROS: negative for - chills, fatigue, fever, night sweats, weight gain or weight loss Psychological ROS: negative for - behavioral disorder, hallucinations, memory difficulties, mood swings or suicidal ideation Ophthalmic ROS: blind ENT ROS: negative for - epistaxis, nasal discharge, oral lesions, sore throat, tinnitus or vertigo Allergy and Immunology ROS: negative for - hives or itchy/watery eyes Hematological and Lymphatic ROS: negative for - bleeding problems, bruising or swollen lymph nodes Endocrine ROS: negative for - galactorrhea, hair pattern changes, polydipsia/polyuria or temperature intolerance Respiratory ROS: negative for - cough, hemoptysis, shortness of breath or wheezing Cardiovascular ROS: negative for - chest pain, dyspnea on exertion, edema or irregular heartbeat Gastrointestinal ROS: negative for - abdominal pain, diarrhea, hematemesis, nausea/vomiting or stool incontinence Genito-Urinary ROS: negative for - dysuria, hematuria, incontinence or urinary frequency/urgency Musculoskeletal ROS: negative for - joint swelling or muscular weakness Neurological ROS: as noted in HPI Dermatological ROS: negative  for rash and skin lesion changes  Physical Examination: Blood pressure 110/65, pulse 67, temperature (!) 97.5 F (36.4 C), temperature source Oral, resp. rate 20, height 6\' 8"  (2.032 m), weight 90.7 kg (200 lb), SpO2 100 %.  HEENT-  Normocephalic, no lesions, without obvious abnormality.  Normal external eye and conjunctiva.  Normal TM's bilaterally.  Normal auditory canals and external ears. Normal external nose, mucus membranes and septum.  Normal  pharynx. Cardiovascular- S1, S2 normal, pulses palpable throughout   Lungs- chest clear, no wheezing, rales, normal symmetric air entry Abdomen- soft, non-tender; bowel sounds normal; no masses,  no organomegaly Extremities- no edema Lymph-no adenopathy palpable Musculoskeletal-no joint tenderness, deformity or swelling Skin-warm and dry, no hyperpigmentation, vitiligo, or suspicious lesions  Neurological Examination   Mental Status: Lethargic.  Markedly dysarthric.  Able to follow simple commands without difficulty. Cranial Nerves: II: Blind III,IV, VI: ptosis not present, extra-ocular motions intact bilaterally V,VII: mild right facial droop, facial light touch sensation normal bilaterally VIII: hearing normal bilaterally IX,X: gag reflex present XI: bilateral shoulder shrug XII: midline tongue extension Motor: Right : Upper extremity   5-/5    Left:     Upper extremity   5/5  Lower extremity   5-/5     Lower extremity   5/5 Tone and bulk:normal tone throughout; no atrophy noted Sensory: Pinprick and light touch intact throughout, bilaterally Deep Tendon Reflexes: 2+ and symmetric with absent AJ's bilaterally Plantars: Right: mute   Left: mute Cerebellar: Unable to perform due to lethargy and blindness Gait: not tested due to safety concerns   Laboratory Studies:   Basic Metabolic Panel:  Recent Labs Lab 04/11/17 2239 04/12/17 0640  NA 140 140  K 3.7 3.7  CL 107 108  CO2 27 26  GLUCOSE 124* 101*  BUN 21* 21*  CREATININE 1.51* 1.22  CALCIUM 9.3 9.2    Liver Function Tests:  Recent Labs Lab 04/11/17 2239  AST 24  ALT 20  ALKPHOS 76  BILITOT 0.8  PROT 7.1  ALBUMIN 3.5   No results for input(s): LIPASE, AMYLASE in the last 168 hours. No results for input(s): AMMONIA in the last 168 hours.  CBC:  Recent Labs Lab 04/11/17 2239 04/12/17 0640  WBC 5.9 6.3  NEUTROABS 3.8  --   HGB 12.3* 12.0*  HCT 36.2* 35.2*  MCV 85.9 86.0  PLT 195 168     Cardiac Enzymes: No results for input(s): CKTOTAL, CKMB, CKMBINDEX, TROPONINI in the last 168 hours.  BNP: Invalid input(s): POCBNP  CBG: No results for input(s): GLUCAP in the last 168 hours.  Microbiology: Results for orders placed or performed in visit on 11/02/11  Influenza A&B Antigens [facility])     Status: None   Collection Time: 11/02/11  9:28 AM  Result Value Ref Range Status   Micro Text Report   Final       COMMENT                   NEGATIVE FOR INFLUENZA A AND B   ANTIBIOTIC                                                        Coagulation Studies: No results for input(s): LABPROT, INR in the last 72 hours.  Urinalysis:  Recent Labs Lab 04/12/17 0741  COLORURINE YELLOW*  LABSPEC 1.011  PHURINE 7.0  GLUCOSEU NEGATIVE  HGBUR NEGATIVE  BILIRUBINUR NEGATIVE  KETONESUR NEGATIVE  PROTEINUR NEGATIVE  NITRITE POSITIVE*  LEUKOCYTESUR NEGATIVE    Lipid Panel:  No results found for: CHOL, TRIG, HDL, CHOLHDL, VLDL, LDLCALC  HgbA1C:  Lab Results  Component Value Date   HGBA1C 5.3 02/13/2017    Urine Drug Screen:  No results found for: LABOPIA, COCAINSCRNUR, LABBENZ, AMPHETMU, THCU, LABBARB  Alcohol Level: No results for input(s): ETH in the last 168 hours.   Imaging: Ct Head Wo Contrast  Result Date: 04/12/2017 CLINICAL DATA:  Hypertension and seizure like activity. EXAM: CT HEAD WITHOUT CONTRAST TECHNIQUE: Contiguous axial images were obtained from the base of the skull through the vertex without intravenous contrast. COMPARISON:  Head CT 02/13/2017 FINDINGS: Brain: Brain is partially obscured by streak artifact from multiple metallic foreign bodies in the scalp. There is periventricular hypoattenuation compatible with chronic microvascular disease. No acute hemorrhage. No midline shift or mass effect. Vascular: No hyperdense vessel or unexpected calcification. Skull: Multiple metallic foreign bodies in the scalp soft tissues. No skull fracture.  Sinuses/Orbits: Paranasal sinuses and mastoids are clear. Bilateral atrophic, partially calcified globes. Other: None. IMPRESSION: 1. Examination degraded by streak artifact from numerous metallic foreign bodies in the scalp. Within that limitation, no acute intracranial abnormality. 2. Chronic atrophy and microvascular ischemia. Electronically Signed   By: Deatra RobinsonKevin  Herman M.D.   On: 04/12/2017 01:35     Assessment/Plan: 61 year old male with a history of stroke and seizures on Dilantin that has missed his dose for the past 2 days and presented with a breakthrough seizure likely related to noncompliance.  Patient usually takes 400mg  at night.  Has been loaded and restarted on his maintenance dose.  Load and maintenance dose were only separated by about 8 hours.  Level may be a little high with recheck.  Would change back to home schedule for tomorrow, nightly, and maintain there since patient has tolerated this well for quite some time per wife. Would also continue seizure precautions. Head CT reviewed and shows no acute changes.  Case discussed with Dr. Allena KatzPatel.      Thana FarrLeslie Pape Parson, MD Neurology [phone] 04/12/2017, 2:59 PM

## 2017-04-13 DIAGNOSIS — R569 Unspecified convulsions: Secondary | ICD-10-CM | POA: Diagnosis not present

## 2017-04-13 DIAGNOSIS — Z8673 Personal history of transient ischemic attack (TIA), and cerebral infarction without residual deficits: Secondary | ICD-10-CM | POA: Diagnosis not present

## 2017-04-13 DIAGNOSIS — I10 Essential (primary) hypertension: Secondary | ICD-10-CM | POA: Diagnosis not present

## 2017-04-13 DIAGNOSIS — G40909 Epilepsy, unspecified, not intractable, without status epilepticus: Secondary | ICD-10-CM | POA: Diagnosis not present

## 2017-04-13 DIAGNOSIS — E860 Dehydration: Secondary | ICD-10-CM | POA: Diagnosis not present

## 2017-04-13 LAB — PHENYTOIN LEVEL, TOTAL: PHENYTOIN LVL: 13.5 ug/mL (ref 10.0–20.0)

## 2017-04-13 MED ORDER — DIPHENHYDRAMINE HCL 25 MG PO CAPS
25.0000 mg | ORAL_CAPSULE | Freq: Once | ORAL | Status: AC
  Administered 2017-04-13: 09:00:00 25 mg via ORAL
  Filled 2017-04-13: qty 1

## 2017-04-13 MED ORDER — HYDROCHLOROTHIAZIDE 25 MG PO TABS: 25.0000 mg | ORAL_TABLET | Freq: Every day | ORAL | 0 refills | Status: AC

## 2017-04-13 NOTE — Discharge Summary (Signed)
[facility] [place] at [facility]   PATIENT NAME: [name]    MR#:  [id number]  DATE OF BIRTH:  1956/06/03  DATE OF ADMISSION:  04/11/2017 ADMITTING PHYSICIAN: Ihor Austin, MD  DATE OF DISCHARGE: 04/13/2017  PRIMARY CARE PHYSICIAN: Derwood Kaplan, MD    ADMISSION DIAGNOSIS:  Seizure (HCC) [R56.9]  DISCHARGE DIAGNOSIS:  Breakthrough seizures secondary to medication noncompliance Hypertension  SECONDARY DIAGNOSIS:   Past Medical History:  Diagnosis Date  . Hypertension   . Seizures (HCC)   . Stroke [facility])     HOSPITAL COURSE:  [name] a 61 y.o.malewho comes into the hospital today with a seizure. 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 He typically takes Dilantin but he has not taken his medication approximately 2 or 3 days. They report that they're unable to afford his medications. They have also been giving him intermittent blood pressure medications.  1. Breakthrough seizures due to missing Dilantin for about 2 days -IV fospheytoin loading dose given -resume Dilantin 400 mg po qhs. No seizures reported. Discussed with wife to make sure patient does not run out of meds. She states patient's mother will be paying for medications -seizure precautions -seen by Neurology. No new rec  2.Malignant HTN -Continue HCTZ, clonidine and prn hydralazine -pt not taking meds properly---some financial constraint.  3. TBI with legally blind since 1977 Wife cares for him  4. DVT prophylaxis with lovenox  Discharge home CONSULTS OBTAINED:  Treatment Team:  Kym Groom, MD Thana Farr, MD  DRUG ALLERGIES:   Allergies  Allergen Reactions  . Shellfish Allergy Hives and Swelling    DISCHARGE MEDICATIONS:   Current Discharge Medication List    START taking these medications   Details  hydrochlorothiazide (HYDRODIURIL) 25 MG tablet Take 1 tablet (25 mg  total) by mouth daily. Qty: 30 tablet, Refills: 0      CONTINUE these medications which have NOT CHANGED   Details  aspirin EC 81 MG EC tablet Take 1 tablet (81 mg total) by mouth daily.    atorvastatin (LIPITOR) 40 MG tablet Take 1 tablet (40 mg total) by mouth daily at 6 PM. Qty: 30 tablet, Refills: 0    cloNIDine (CATAPRES) 0.2 MG tablet Take 1 tablet (0.2 mg total) by mouth 2 (two) times daily. Qty: 60 tablet, Refills: 11    omeprazole (PRILOSEC) 20 MG capsule Take 40 mg by mouth daily.    phenytoin (DILANTIN) 100 MG ER capsule Take 400 mg by mouth daily.    polyethylene glycol (MIRALAX / GLYCOLAX) packet Take 17 g by mouth daily. Qty: 14 each, Refills: 0      STOP taking these medications     mirtazapine (REMERON) 45 MG tablet         If you experience worsening of your admission symptoms, develop shortness of breath, life threatening emergency, suicidal or homicidal thoughts you must seek medical attention immediately by calling 911 or calling your MD immediately  if symptoms less severe.  You Must read complete instructions/literature along with all the possible adverse reactions/side effects for all the Medicines you take and that have been prescribed to you. Take any new Medicines after you have completely understood and accept all the possible adverse reactions/side effects.   Please note  You were cared for by a hospitalist during your hospital stay. If you have any questions about your discharge medications or the care you received  while you were in the hospital after you are discharged, you can call the unit and asked to speak with the hospitalist on call if the hospitalist that took care of you is not available. Once you are discharged, your primary care physician will handle any further medical issues. Please note that NO REFILLS for any discharge medications will be authorized once you are discharged, as it is imperative that you return to your primary care  physician (or establish a relationship with a primary care physician if you do not have one) for your aftercare needs so that they can reassess your need for medications and monitor your lab values. Today   SUBJECTIVE   Complains of itching on the back  VITAL SIGNS:  Blood pressure 137/81, pulse 76, temperature 98.1 F (36.7 C), temperature source Axillary, resp. rate 20, height 6\' 8"  (2.032 m), weight 90.7 kg (200 lb), SpO2 100 %.  I/O:   Intake/Output Summary (Last 24 hours) at 04/13/17 0813 Last data filed at 04/13/17 0758  Gross per 24 hour  Intake           1002.5 ml  Output              200 ml  Net            802.5 ml    PHYSICAL EXAMINATION:  GENERAL:  61 y.o.-year-old patient lying in the bed with no acute distress.  EYESNo scleral icterus. Extraocular muscles intact. Corneal opacity HEENT: Head atraumatic, normocephalic. Oropharynx and nasopharynx clear.  NECK:  Supple, no jugular venous distention. No thyroid enlargement, no tenderness.  LUNGS: Normal breath sounds bilaterally, no wheezing, rales,rhonchi or crepitation. No use of accessory muscles of respiration.  CARDIOVASCULAR: S1, S2 normal. No murmurs, rubs, or gallops.  ABDOMEN: Soft, non-tender, non-distended. Bowel sounds present. No organomegaly or mass.  EXTREMITIES: No pedal edema, cyanosis, or clubbing.  NEUROLOGIC: Cranial nerves II through XII are intact. Muscle strength 5/5 in all extremities. Sensation intact. Gait not checked. 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  SKIN: No obvious rash, lesion, or ulcer.   DATA REVIEW:   CBC   Recent Labs Lab 04/12/17 0640  WBC 6.3  HGB 12.0*  HCT 35.2*  PLT 168    Chemistries   Recent Labs Lab 04/11/17 2239 04/12/17 0640  NA 140 140  K 3.7 3.7  CL 107 108  CO2 27 26  GLUCOSE 124* 101*  BUN 21* 21*  CREATININE 1.51* 1.22  CALCIUM 9.3 9.2  AST 24  --   ALT 20  --   ALKPHOS 76  --    BILITOT 0.8  --     Microbiology Results   No results found for this or any previous visit (from the past 240 hour(s)).  RADIOLOGY:  Ct Head Wo Contrast  Result Date: 04/12/2017 CLINICAL DATA:  Hypertension and seizure like activity. EXAM: CT HEAD WITHOUT CONTRAST TECHNIQUE: Contiguous axial images were obtained from the base of the skull through the vertex without intravenous contrast. COMPARISON:  Head CT 02/13/2017 FINDINGS: Brain: Brain is partially obscured by streak artifact from multiple metallic foreign bodies in the scalp. There is periventricular hypoattenuation compatible with chronic microvascular disease. No acute hemorrhage. No midline shift or mass effect. Vascular: No hyperdense vessel or unexpected calcification. Skull: Multiple metallic foreign bodies in the scalp soft tissues. No skull fracture. Sinuses/Orbits: Paranasal sinuses and mastoids are clear. Bilateral atrophic, partially calcified globes. Other: None. IMPRESSION: 1.  Examination degraded by streak artifact from numerous metallic foreign bodies in the scalp. Within that limitation, no acute intracranial abnormality. 2. Chronic atrophy and microvascular ischemia. Electronically Signed   By: Deatra Robinson M.D.   On: 04/12/2017 01:35     Management plans discussed with the patient, family and they are in agreement.  CODE STATUS:     Code Status Orders        Start     Ordered   04/12/17 0444  Full code  Continuous     04/12/17 0443    Code Status History    Date Active Date Inactive Code Status Order ID Comments User Context   02/14/2017  3:50 AM 02/15/2017  8:09 PM Full Code [id number]  [name], MD ED      TOTAL TIME TAKING CARE OF THIS PATIENT: 40 minutes.    Kristofor Michalowski M.D on 04/13/2017 at 8:13 AM  Between 7am to 6pm - Pager - [phone] After 6pm go to [email or web address] - Social research officer, government  [facility]  Office  [phone]  CC: Primary care physician; Derwood Kaplan,  MD

## 2017-04-13 NOTE — Care Management (Signed)
Mr. 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 A cane, walker, or crutch will not resolve his issues of performing his activities of daily living. A wheelchair will allow [name] to safely perform his daily activities. Mr. [name] can safely propel a wheelchair in the home or has a family member who can provide assistance Gwenette GreetBrenda S Loc Feinstein RN MSN CCM Care Management [phone]

## 2017-04-13 NOTE — Progress Notes (Signed)
Pt has fall risk and allergy armbands on. Educate pt and family about the purpose of the armbands.

## 2017-04-13 NOTE — Care Management (Signed)
[facility] the [facility] Lives with wife x 40 years. Last seen Dr. Maryellen PileEason 4 months ago. [facility]  Wife states she pays $5.00 for each prescription and that his mother will be paying for the next prescription. Rolling walker and wheelchair in the home. No falls. No home health. No skilled facility. Family will transport Physical therapy evaluation pending.  Gwenette GreetBrenda S Dorathy Stallone RN MSN CCM Care Management [phone]

## 2017-04-13 NOTE — Progress Notes (Signed)
Received MD order to discharge patient to home with home health  , reviewed home meds discharge instructions and prescriptions with patient and wife and she verified understanding patients wife will pick up wheelchair from [facility]

## 2017-04-13 NOTE — Care Management Obs Status (Signed)
MEDICARE OBSERVATION STATUS NOTIFICATION   Patient Details  Name: [name] MRN: [id number] Date of Birth: Jun 29, 1956   Medicare Observation Status Notification Given:  Yes    Jerry GreetBrenda S Rekia Kujala, RN 04/13/2017, 8:43 AM

## 2017-04-13 NOTE — Care Management (Signed)
Physical therapy evaluation completed. Recommending home with home health, physical therapy, rolling walker, wheelchair. [facility] will provide wheelchair and nursing services. Discharge to home today per Dr. Allena KatzPatel.  Gwenette GreetBrenda S Minnette Merida RN MSN CCM Care Management [phone]

## 2017-04-13 NOTE — Evaluation (Signed)
Physical Therapy Evaluation Patient Details Name: Jerry ChurnDennis Patton MRN: [id number] DOB: [birth year] Today's Date: 04/13/2017   History of Present Illness  [name] is a 61 y.o. male with a known history of hypertension, seizure disorder, TBI, and CVA presented to the emergency room for seizure. According to patient's wife patient did not take Dilantin for 2 days. He missed his medication because he ran out of the medication. He presented to the emergency room after he had seizures. Patient was given IV fosphenytoin 1360 mg in the emergency room. No new episodes of seizures were noted in the emergency room. Blood pressure was also elevated when patient presented to the emergency room. No history of fall and head injury. Patient has blindness in both eyes. He is taken care of by his wife at home. He is dependent on activities of daily living. He was worked up with CT head which showed no acute intracranial abnormality. Hospitalist service was consulted for further care of the patient.   Clinical Impression  Pt admitted with above diagnosis. Pt currently with functional limitations due to the deficits listed below (see PT Problem List).  Pt is significantly weaker today than compared to prior admission where he required minA for transfers and ambulated 125' with moderate assistance. Pt requiring modA today for both bed mobility and transfers and is too unsafe to attempt ambulation. Per family pt has been crawling around on the floor at home for mobility since last discharge. They believe that he is significantly weaker and would benefit from PT but he has had issues participating with therapy in the past. Daughter has questions regarding SNF vs LTC placement options. They also report that his wheelchair is broken and his walker doesn't have wheels. [facility] however unfortunately he doesn't have a payer source which will cover PT services per care manager. He appears to be declining in his  function and it is reasonable to consider that he will get too difficult to manage at home. Recommendations from PT are for wheelchair, walker, and HH PT. Pt will benefit from PT services to address deficits in strength, balance, and mobility in order to return to full function at home.     Follow Up Recommendations Home health PT    Equipment Recommendations  Rolling walker with 5" wheels;Wheelchair (measurements PT)    Recommendations for Other Services       Precautions / Restrictions Precautions Precautions: Fall Restrictions Weight Bearing Restrictions: No      Mobility  Bed Mobility Overal bed mobility: Needs Assistance Bed Mobility: Supine to Sit;Sit to Supine     Supine to sit: Mod assist     General bed mobility comments: Pt requires considerable assist to go from R sidelying to sitting. HOB elevated and bed rails utilized  Transfers Overall transfer level: Needs assistance Equipment used: Rolling walker (2 wheeled) Transfers: Sit to/from Stand Sit to Stand: Mod assist         General transfer comment: Pt requires modA+1 to come to standing. He requires heavy cues to extend hips/knees and stand in fully upright posture. Pt with poor balance and falls back onto bed multiple times. Attempted marches in standing but pt struggles to perform  Ambulation/Gait             General Gait Details: Unable/unsafe to attempt  Stairs            Wheelchair Mobility    Modified Rankin (Stroke Patients Only)  Balance Overall balance assessment: Needs assistance Sitting-balance support: No upper extremity supported Sitting balance-Leahy Scale: Fair     Standing balance support: Bilateral upper extremity supported Standing balance-Leahy Scale: Poor Standing balance comment: Continual support required to remain in standing                             Pertinent Vitals/Pain Pain Assessment: No/denies pain    Home Living Family/patient  expects to be discharged to:: Private residence Living Arrangements: Spouse/significant other Available Help at Discharge: Family Type of Home: House Home Access: Ramped entrance     Home Layout: One level Home Equipment: Environmental consultant - 2 wheels;Wheelchair - manual      Prior Function Level of Independence: Needs assistance   Gait / Transfers Assistance Needed: Wife reports that since his last admission pt has been crawling. Prior to that he was ambulating with rolling walker. No falls in the last 12 months. Wheelchair for longer distances  ADL's / Homemaking Assistance Needed: Assist with ADLs from wife and daughter        Hand Dominance   Dominant Hand: Right    Extremity/Trunk Assessment   Upper Extremity Assessment Upper Extremity Assessment: Overall WFL for tasks assessed    Lower Extremity Assessment Lower Extremity Assessment: Generalized weakness       Communication   Communication: Expressive difficulties;Other (comment) (Difficult to understand)  Cognition Arousal/Alertness: Awake/alert Behavior During Therapy: Agitated Overall Cognitive Status: History of cognitive impairments - at baseline                                 General Comments: Pt reports that he is close to his baseline but more agitated recently. Therapist mostly unable to understand patient      General Comments      Exercises     Assessment/Plan    PT Assessment Patient needs continued PT services  PT Problem List Decreased strength;Decreased activity tolerance;Decreased balance;Decreased mobility;Decreased cognition;Decreased safety awareness       PT Treatment Interventions DME instruction;Gait training;Functional mobility training;Therapeutic activities;Therapeutic exercise;Balance training;Neuromuscular re-education;Cognitive remediation;Patient/family education    PT Goals (Current goals can be found in the Care Plan section)  Acute Rehab PT Goals Patient Stated  Goal: Improve his ability to walk PT Goal Formulation: With family Time For Goal Achievement: 04/27/17 Potential to Achieve Goals: Fair    Frequency Min 2X/week   Barriers to discharge Other (comment) (Becoming increasingly difficult to care for patient's needs)      Co-evaluation               AM-PAC PT "6 Clicks" Daily Activity  Outcome Measure Difficulty turning over in bed (including adjusting bedclothes, sheets and blankets)?: Total Difficulty moving from lying on back to sitting on the side of the bed? : Total Difficulty sitting down on and standing up from a chair with arms (e.g., wheelchair, bedside commode, etc,.)?: Total Help needed moving to and from a bed to chair (including a wheelchair)?: A Lot Help needed walking in hospital room?: A Lot Help needed climbing 3-5 steps with a railing? : Total 6 Click Score: 8    End of Session Equipment Utilized During Treatment: Gait belt Activity Tolerance: Patient tolerated treatment well Patient left: in bed;with call bell/phone within reach;Other (comment) (Sitting at EOB with wife) Nurse Communication: Other (comment) (Long discussion with MD and care manager regarding needs) PT Visit  Diagnosis: Unsteadiness on feet (R26.81);Repeated falls (R29.6);Muscle weakness (generalized) (M62.81)    Time: 0900-0930 PT Time Calculation (min) (ACUTE ONLY): 30 min   Charges:   PT Evaluation $PT Eval Moderate Complexity: 1 Procedure     PT G Codes:   PT G-Codes **NOT FOR INPATIENT CLASS** Functional Assessment Tool Used: AM-PAC 6 Clicks Basic Mobility Functional Limitation: Mobility: Walking and moving around Mobility: Walking and Moving Around Current Status (Z6109): At least 80 percent but less than 100 percent impaired, limited or restricted Mobility: Walking and Moving Around Goal Status [phone]): At least 40 percent but less than 60 percent impaired, limited or restricted    Lynnea Maizes PT, DPT     Loy Little 04/13/2017, 10:11 AM

## 2017-04-16 ENCOUNTER — Emergency Department
Admission: EM | Admit: 2017-04-16 | Discharge: 2017-04-16 | Disposition: A | Discharge status: Home / Self Care | Payer: Medicare Other | Attending: Emergency Medicine | Admitting: Emergency Medicine

## 2017-04-16 ENCOUNTER — Encounter: Payer: Self-pay | Admitting: Intensive Care

## 2017-04-16 ENCOUNTER — Emergency Department: Payer: Medicare Other

## 2017-04-16 DIAGNOSIS — I10 Essential (primary) hypertension: Secondary | ICD-10-CM | POA: Diagnosis not present

## 2017-04-16 DIAGNOSIS — Z87891 Personal history of nicotine dependence: Secondary | ICD-10-CM | POA: Insufficient documentation

## 2017-04-16 DIAGNOSIS — R6889 Other general symptoms and signs: Secondary | ICD-10-CM | POA: Diagnosis not present

## 2017-04-16 DIAGNOSIS — Z8673 Personal history of transient ischemic attack (TIA), and cerebral infarction without residual deficits: Secondary | ICD-10-CM | POA: Insufficient documentation

## 2017-04-16 DIAGNOSIS — R531 Weakness: Secondary | ICD-10-CM | POA: Diagnosis not present

## 2017-04-16 DIAGNOSIS — W19XXXA Unspecified fall, initial encounter: Secondary | ICD-10-CM | POA: Diagnosis not present

## 2017-04-16 LAB — BASIC METABOLIC PANEL
ANION GAP: 8 (ref 5–15)
BUN: 27 mg/dL — ABNORMAL HIGH (ref 6–20)
CHLORIDE: 104 mmol/L (ref 101–111)
CO2: 26 mmol/L (ref 22–32)
Calcium: 9.5 mg/dL (ref 8.9–10.3)
Creatinine, Ser: 1.74 mg/dL — ABNORMAL HIGH (ref 0.61–1.24)
GFR calc non Af Amer: 41 mL/min — ABNORMAL LOW (ref >60)
GFR, EST AFRICAN AMERICAN: 47 mL/min — AB (ref >60)
GLUCOSE: 96 mg/dL (ref 65–99)
POTASSIUM: 4.1 mmol/L (ref 3.5–5.1)
Sodium: 138 mmol/L (ref 135–145)

## 2017-04-16 LAB — CBC
HEMATOCRIT: 39 % — AB (ref 40.0–52.0)
HEMOGLOBIN: 13.2 g/dL (ref 13.0–18.0)
MCH: 29.1 pg (ref 26.0–34.0)
MCHC: 33.8 g/dL (ref 32.0–36.0)
MCV: 85.9 fL (ref 80.0–100.0)
Platelets: 195 10*3/uL (ref 150–440)
RBC: 4.54 MIL/uL (ref 4.40–5.90)
RDW: 14.2 % (ref 11.5–14.5)
WBC: 6.2 10*3/uL (ref 3.8–10.6)

## 2017-04-16 LAB — TROPONIN I: Troponin I: 0.03 ng/mL (ref <0.03)

## 2017-04-16 LAB — ETHANOL

## 2017-04-16 MED ORDER — METOPROLOL TARTRATE 5 MG/5ML IV SOLN: 10.0000 mg | Freq: Once | INTRAVENOUS | Status: DC

## 2017-04-16 MED ORDER — SODIUM CHLORIDE 0.9 % IV BOLUS (SEPSIS)
1000.0000 mL | Freq: Once | INTRAVENOUS | Status: AC
  Administered 2017-04-16: 1000 mL via INTRAVENOUS

## 2017-04-16 MED ORDER — METOPROLOL TARTRATE 50 MG PO TABS
50.0000 mg | ORAL_TABLET | Freq: Once | ORAL | Status: AC
  Administered 2017-04-16: 50 mg via ORAL
  Filled 2017-04-16: qty 1

## 2017-04-16 MED ORDER — HYDRALAZINE HCL 20 MG/ML IJ SOLN: 10.0000 mg | Freq: Once | INTRAMUSCULAR | Status: DC

## 2017-04-16 NOTE — ED Notes (Signed)
Pt attempted to void.  Unable to at this time.
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
## 2017-04-16 NOTE — ED Notes (Signed)
Patient transported to CT 

## 2017-04-16 NOTE — Discharge Instructions (Signed)
Please schedule an appointment with your primary care physician to discuss your chronic symptoms. Your primary care physician may also help you get in-home health, physical therapy, or other additional healthcare options to help with her symptoms. Please make sure you're taking all of your medications as prescribed.  Return to the emergency department for changes in mental status, severe pain, fever, or any other symptoms concerning to you.

## 2017-04-16 NOTE — ED Triage Notes (Signed)
Patient arrived by EMS from home. Family called EMS. They believe patient is experiencing more weakness since previous stroke. Pt reports he does not use wheelchair at home but scoots around in floor. EMS vitals 190/121

## 2017-04-16 NOTE — ED Notes (Signed)
Pt given graham crackers and ginger ale ?

## 2017-04-19 ENCOUNTER — Emergency Department: Payer: Medicare Other

## 2017-04-19 ENCOUNTER — Emergency Department
Admission: EM | Admit: 2017-04-19 | Discharge: 2017-04-19 | Discharge status: Against Medical Advice | Payer: Medicare Other | Attending: Emergency Medicine | Admitting: Emergency Medicine

## 2017-04-19 DIAGNOSIS — R748 Abnormal levels of other serum enzymes: Secondary | ICD-10-CM | POA: Diagnosis not present

## 2017-04-19 DIAGNOSIS — E876 Hypokalemia: Secondary | ICD-10-CM | POA: Diagnosis not present

## 2017-04-19 DIAGNOSIS — R778 Other specified abnormalities of plasma proteins: Secondary | ICD-10-CM

## 2017-04-19 DIAGNOSIS — G40401 Other generalized epilepsy and epileptic syndromes, not intractable, with status epilepticus: Secondary | ICD-10-CM | POA: Diagnosis not present

## 2017-04-19 DIAGNOSIS — R531 Weakness: Secondary | ICD-10-CM | POA: Diagnosis present

## 2017-04-19 DIAGNOSIS — I10 Essential (primary) hypertension: Secondary | ICD-10-CM | POA: Insufficient documentation

## 2017-04-19 DIAGNOSIS — Z7982 Long term (current) use of aspirin: Secondary | ICD-10-CM | POA: Diagnosis not present

## 2017-04-19 DIAGNOSIS — R55 Syncope and collapse: Secondary | ICD-10-CM | POA: Insufficient documentation

## 2017-04-19 DIAGNOSIS — R9431 Abnormal electrocardiogram [ECG] [EKG]: Secondary | ICD-10-CM | POA: Diagnosis not present

## 2017-04-19 DIAGNOSIS — R4182 Altered mental status, unspecified: Secondary | ICD-10-CM | POA: Diagnosis not present

## 2017-04-19 DIAGNOSIS — Z87891 Personal history of nicotine dependence: Secondary | ICD-10-CM | POA: Insufficient documentation

## 2017-04-19 DIAGNOSIS — R7989 Other specified abnormal findings of blood chemistry: Secondary | ICD-10-CM

## 2017-04-19 DIAGNOSIS — I119 Hypertensive heart disease without heart failure: Secondary | ICD-10-CM | POA: Diagnosis not present

## 2017-04-19 DIAGNOSIS — Z79899 Other long term (current) drug therapy: Secondary | ICD-10-CM | POA: Diagnosis not present

## 2017-04-19 DIAGNOSIS — T675XXA Heat exhaustion, unspecified, initial encounter: Secondary | ICD-10-CM | POA: Diagnosis not present

## 2017-04-19 LAB — CBC WITH DIFFERENTIAL/PLATELET
BASOS ABS: 0 10*3/uL (ref 0–0.1)
Basophils Relative: 0 %
EOS ABS: 0.1 10*3/uL (ref 0–0.7)
EOS PCT: 1 %
HCT: 36.9 % — ABNORMAL LOW (ref 40.0–52.0)
Hemoglobin: 12.4 g/dL — ABNORMAL LOW (ref 13.0–18.0)
LYMPHS ABS: 1.2 10*3/uL (ref 1.0–3.6)
LYMPHS PCT: 10 %
MCH: 29.5 pg (ref 26.0–34.0)
MCHC: 33.7 g/dL (ref 32.0–36.0)
MCV: 87.5 fL (ref 80.0–100.0)
Monocytes Absolute: 0.7 10*3/uL (ref 0.2–1.0)
Monocytes Relative: 6 %
Neutro Abs: 10 10*3/uL — ABNORMAL HIGH (ref 1.4–6.5)
Neutrophils Relative %: 83 %
PLATELETS: 182 10*3/uL (ref 150–440)
RBC: 4.22 MIL/uL — ABNORMAL LOW (ref 4.40–5.90)
RDW: 14.2 % (ref 11.5–14.5)
WBC: 12 10*3/uL — AB (ref 3.8–10.6)

## 2017-04-19 LAB — BASIC METABOLIC PANEL
ANION GAP: 6 (ref 5–15)
BUN: 24 mg/dL — ABNORMAL HIGH (ref 6–20)
CALCIUM: 8.4 mg/dL — AB (ref 8.9–10.3)
CO2: 26 mmol/L (ref 22–32)
Chloride: 106 mmol/L (ref 101–111)
Creatinine, Ser: 1.88 mg/dL — ABNORMAL HIGH (ref 0.61–1.24)
GFR, EST AFRICAN AMERICAN: 43 mL/min — AB (ref >60)
GFR, EST NON AFRICAN AMERICAN: 37 mL/min — AB (ref >60)
GLUCOSE: 123 mg/dL — AB (ref 65–99)
POTASSIUM: 3.7 mmol/L (ref 3.5–5.1)
Sodium: 138 mmol/L (ref 135–145)

## 2017-04-19 LAB — COMPREHENSIVE METABOLIC PANEL
ALT: 12 U/L — ABNORMAL LOW (ref 17–63)
AST: 20 U/L (ref 15–41)
Albumin: 3.3 g/dL — ABNORMAL LOW (ref 3.5–5.0)
Alkaline Phosphatase: 84 U/L (ref 38–126)
Anion gap: 6 (ref 5–15)
BUN: 22 mg/dL — ABNORMAL HIGH (ref 6–20)
CHLORIDE: 109 mmol/L (ref 101–111)
CO2: 23 mmol/L (ref 22–32)
Calcium: 8 mg/dL — ABNORMAL LOW (ref 8.9–10.3)
Creatinine, Ser: 1.63 mg/dL — ABNORMAL HIGH (ref 0.61–1.24)
GFR, EST AFRICAN AMERICAN: 51 mL/min — AB (ref >60)
GFR, EST NON AFRICAN AMERICAN: 44 mL/min — AB (ref >60)
Glucose, Bld: 125 mg/dL — ABNORMAL HIGH (ref 65–99)
POTASSIUM: 3 mmol/L — AB (ref 3.5–5.1)
SODIUM: 138 mmol/L (ref 135–145)
Total Bilirubin: 0.6 mg/dL (ref 0.3–1.2)
Total Protein: 6.3 g/dL — ABNORMAL LOW (ref 6.5–8.1)

## 2017-04-19 LAB — TROPONIN I: Troponin I: 0.03 ng/mL (ref <0.03)

## 2017-04-19 MED ORDER — POTASSIUM CHLORIDE 10 MEQ/100ML IV SOLN
10.0000 meq | Freq: Once | INTRAVENOUS | Status: AC
  Administered 2017-04-19: 10 meq via INTRAVENOUS
  Filled 2017-04-19: qty 100

## 2017-04-19 MED ORDER — MAGNESIUM SULFATE IN D5W 1-5 GM/100ML-% IV SOLN
1.0000 g | Freq: Once | INTRAVENOUS | Status: AC
  Administered 2017-04-19: 1 g via INTRAVENOUS
  Filled 2017-04-19: qty 100

## 2017-04-19 MED ORDER — POTASSIUM CHLORIDE ER 10 MEQ PO TBCR: 10.0000 meq | EXTENDED_RELEASE_TABLET | Freq: Every day | ORAL | 0 refills | Status: DC

## 2017-04-19 MED ORDER — SODIUM CHLORIDE 0.9 % IV BOLUS (SEPSIS)
1000.0000 mL | Freq: Once | INTRAVENOUS | Status: AC
  Administered 2017-04-19: 1000 mL via INTRAVENOUS

## 2017-04-19 NOTE — ED Notes (Signed)
Meal tray given to patient.

## 2017-04-19 NOTE — Discharge Instructions (Signed)
You have been seen in the Emergency Department (ED) today for chest pain.  As we have discussed today?s test results are ABNORMAL and I recommend you stay in the hospital, but you have elected to leave against my recommendation.  Please follow up with the recommended doctor as instructed above in these documents regarding today?s emergent visit and your recent symptoms to discuss further management.  Continue to take your regular medications. If you are not doing so already, please also take a daily baby aspirin (81 mg), at least until you follow up with your doctor.  Return to the Emergency Department (ED) if you experience any further chest pain/pressure/tightness, with to continue your evaluation or change your mind about being admitted, have difficulty breathing, or sudden sweating, or other symptoms that concern you.

## 2017-04-19 NOTE — ED Triage Notes (Signed)
Pt was standing outside the [employer] and was feeling overheated. Was given water and ice pack for back of neck and was sweaty/clammy. Given 250 bolus. Normally dysphagic, sits in wheelchair, past mi's, stroke and seizure hx

## 2017-04-19 NOTE — ED Notes (Signed)
Pt verbalized having been talked to about admission and understands that he is leaving the hospital against medical advise.
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
## 2017-04-30 DIAGNOSIS — Z87891 Personal history of nicotine dependence: Secondary | ICD-10-CM | POA: Diagnosis not present

## 2017-04-30 DIAGNOSIS — I10 Essential (primary) hypertension: Secondary | ICD-10-CM | POA: Diagnosis not present

## 2017-04-30 DIAGNOSIS — S069X9S Unspecified intracranial injury with loss of consciousness of unspecified duration, sequela: Secondary | ICD-10-CM | POA: Diagnosis not present

## 2017-04-30 DIAGNOSIS — H548 Legal blindness, as defined in USA: Secondary | ICD-10-CM | POA: Diagnosis not present

## 2017-04-30 DIAGNOSIS — Z7982 Long term (current) use of aspirin: Secondary | ICD-10-CM | POA: Diagnosis not present

## 2017-04-30 DIAGNOSIS — G40909 Epilepsy, unspecified, not intractable, without status epilepticus: Secondary | ICD-10-CM | POA: Diagnosis not present

## 2017-04-30 DIAGNOSIS — Z8673 Personal history of transient ischemic attack (TIA), and cerebral infarction without residual deficits: Secondary | ICD-10-CM | POA: Diagnosis not present

## 2017-04-30 DIAGNOSIS — Z902 Acquired absence of lung [part of]: Secondary | ICD-10-CM | POA: Diagnosis not present

## 2017-05-02 DIAGNOSIS — I10 Essential (primary) hypertension: Secondary | ICD-10-CM | POA: Diagnosis not present

## 2017-05-02 DIAGNOSIS — Z902 Acquired absence of lung [part of]: Secondary | ICD-10-CM | POA: Diagnosis not present

## 2017-05-02 DIAGNOSIS — S069X9S Unspecified intracranial injury with loss of consciousness of unspecified duration, sequela: Secondary | ICD-10-CM | POA: Diagnosis not present

## 2017-05-02 DIAGNOSIS — Z8673 Personal history of transient ischemic attack (TIA), and cerebral infarction without residual deficits: Secondary | ICD-10-CM | POA: Diagnosis not present

## 2017-05-02 DIAGNOSIS — H548 Legal blindness, as defined in USA: Secondary | ICD-10-CM | POA: Diagnosis not present

## 2017-05-02 DIAGNOSIS — G40909 Epilepsy, unspecified, not intractable, without status epilepticus: Secondary | ICD-10-CM | POA: Diagnosis not present

## 2017-05-16 DIAGNOSIS — I10 Essential (primary) hypertension: Secondary | ICD-10-CM | POA: Diagnosis not present

## 2017-05-16 DIAGNOSIS — S069X9S Unspecified intracranial injury with loss of consciousness of unspecified duration, sequela: Secondary | ICD-10-CM | POA: Diagnosis not present

## 2017-05-16 DIAGNOSIS — H548 Legal blindness, as defined in USA: Secondary | ICD-10-CM | POA: Diagnosis not present

## 2017-05-16 DIAGNOSIS — Z902 Acquired absence of lung [part of]: Secondary | ICD-10-CM | POA: Diagnosis not present

## 2017-05-16 DIAGNOSIS — G40909 Epilepsy, unspecified, not intractable, without status epilepticus: Secondary | ICD-10-CM | POA: Diagnosis not present

## 2017-05-16 DIAGNOSIS — Z8673 Personal history of transient ischemic attack (TIA), and cerebral infarction without residual deficits: Secondary | ICD-10-CM | POA: Diagnosis not present

## 2017-05-23 DIAGNOSIS — G40401 Other generalized epilepsy and epileptic syndromes, not intractable, with status epilepticus: Secondary | ICD-10-CM | POA: Diagnosis not present

## 2017-05-23 DIAGNOSIS — I119 Hypertensive heart disease without heart failure: Secondary | ICD-10-CM | POA: Diagnosis not present

## 2017-05-24 DIAGNOSIS — Z8673 Personal history of transient ischemic attack (TIA), and cerebral infarction without residual deficits: Secondary | ICD-10-CM | POA: Diagnosis not present

## 2017-05-24 DIAGNOSIS — I10 Essential (primary) hypertension: Secondary | ICD-10-CM | POA: Diagnosis not present

## 2017-05-24 DIAGNOSIS — S069X9S Unspecified intracranial injury with loss of consciousness of unspecified duration, sequela: Secondary | ICD-10-CM | POA: Diagnosis not present

## 2017-05-24 DIAGNOSIS — Z902 Acquired absence of lung [part of]: Secondary | ICD-10-CM | POA: Diagnosis not present

## 2017-05-24 DIAGNOSIS — G40909 Epilepsy, unspecified, not intractable, without status epilepticus: Secondary | ICD-10-CM | POA: Diagnosis not present

## 2017-05-24 DIAGNOSIS — H548 Legal blindness, as defined in USA: Secondary | ICD-10-CM | POA: Diagnosis not present
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
## 2017-07-29 ENCOUNTER — Encounter: Payer: Self-pay | Admitting: Emergency Medicine

## 2017-07-29 ENCOUNTER — Inpatient Hospital Stay
Admission: EM | Admit: 2017-07-29 | Discharge: 2017-08-02 | DRG: 812 | Disposition: A | Discharge status: Home / Self Care | Payer: Medicare Other | Attending: Internal Medicine | Admitting: Internal Medicine

## 2017-07-29 ENCOUNTER — Other Ambulatory Visit: Payer: Self-pay

## 2017-07-29 DIAGNOSIS — I10 Essential (primary) hypertension: Secondary | ICD-10-CM | POA: Diagnosis not present

## 2017-07-29 DIAGNOSIS — Z79899 Other long term (current) drug therapy: Secondary | ICD-10-CM

## 2017-07-29 DIAGNOSIS — I8500 Esophageal varices without bleeding: Secondary | ICD-10-CM | POA: Diagnosis not present

## 2017-07-29 DIAGNOSIS — H548 Legal blindness, as defined in USA: Secondary | ICD-10-CM | POA: Diagnosis present

## 2017-07-29 DIAGNOSIS — Z91013 Allergy to seafood: Secondary | ICD-10-CM | POA: Diagnosis not present

## 2017-07-29 DIAGNOSIS — R55 Syncope and collapse: Secondary | ICD-10-CM

## 2017-07-29 DIAGNOSIS — D649 Anemia, unspecified: Secondary | ICD-10-CM | POA: Diagnosis present

## 2017-07-29 DIAGNOSIS — E785 Hyperlipidemia, unspecified: Secondary | ICD-10-CM | POA: Diagnosis present

## 2017-07-29 DIAGNOSIS — K449 Diaphragmatic hernia without obstruction or gangrene: Secondary | ICD-10-CM | POA: Diagnosis present

## 2017-07-29 DIAGNOSIS — Z91048 Other nonmedicinal substance allergy status: Secondary | ICD-10-CM

## 2017-07-29 DIAGNOSIS — D508 Other iron deficiency anemias: Secondary | ICD-10-CM | POA: Diagnosis not present

## 2017-07-29 DIAGNOSIS — R569 Unspecified convulsions: Secondary | ICD-10-CM | POA: Diagnosis not present

## 2017-07-29 DIAGNOSIS — Z7982 Long term (current) use of aspirin: Secondary | ICD-10-CM

## 2017-07-29 DIAGNOSIS — N179 Acute kidney failure, unspecified: Secondary | ICD-10-CM | POA: Diagnosis present

## 2017-07-29 DIAGNOSIS — K210 Gastro-esophageal reflux disease with esophagitis: Secondary | ICD-10-CM | POA: Diagnosis present

## 2017-07-29 DIAGNOSIS — K209 Esophagitis, unspecified: Secondary | ICD-10-CM | POA: Diagnosis not present

## 2017-07-29 DIAGNOSIS — N182 Chronic kidney disease, stage 2 (mild): Secondary | ICD-10-CM | POA: Diagnosis present

## 2017-07-29 DIAGNOSIS — R4182 Altered mental status, unspecified: Secondary | ICD-10-CM | POA: Diagnosis not present

## 2017-07-29 DIAGNOSIS — I129 Hypertensive chronic kidney disease with stage 1 through stage 4 chronic kidney disease, or unspecified chronic kidney disease: Secondary | ICD-10-CM | POA: Diagnosis present

## 2017-07-29 DIAGNOSIS — Z87891 Personal history of nicotine dependence: Secondary | ICD-10-CM

## 2017-07-29 DIAGNOSIS — I69320 Aphasia following cerebral infarction: Secondary | ICD-10-CM | POA: Diagnosis not present

## 2017-07-29 DIAGNOSIS — D509 Iron deficiency anemia, unspecified: Secondary | ICD-10-CM | POA: Diagnosis not present

## 2017-07-29 DIAGNOSIS — I69954 Hemiplegia and hemiparesis following unspecified cerebrovascular disease affecting left non-dominant side: Secondary | ICD-10-CM

## 2017-07-29 DIAGNOSIS — G40909 Epilepsy, unspecified, not intractable, without status epilepticus: Secondary | ICD-10-CM | POA: Diagnosis present

## 2017-07-29 DIAGNOSIS — E876 Hypokalemia: Secondary | ICD-10-CM | POA: Diagnosis present

## 2017-07-29 DIAGNOSIS — Z8249 Family history of ischemic heart disease and other diseases of the circulatory system: Secondary | ICD-10-CM | POA: Diagnosis not present

## 2017-07-29 DIAGNOSIS — D62 Acute posthemorrhagic anemia: Secondary | ICD-10-CM | POA: Diagnosis not present

## 2017-07-29 LAB — CBC
HEMATOCRIT: 16.2 % — AB (ref 40.0–52.0)
Hemoglobin: 4.9 g/dL — CL (ref 13.0–18.0)
MCH: 19.8 pg — AB (ref 26.0–34.0)
MCHC: 30 g/dL — AB (ref 32.0–36.0)
MCV: 65.8 fL — ABNORMAL LOW (ref 80.0–100.0)
Platelets: 534 10*3/uL — ABNORMAL HIGH (ref 150–440)
RBC: 2.47 MIL/uL — ABNORMAL LOW (ref 4.40–5.90)
RDW: 21.1 % — AB (ref 11.5–14.5)
WBC: 9.1 10*3/uL (ref 3.8–10.6)

## 2017-07-29 LAB — BASIC METABOLIC PANEL
Anion gap: 8 (ref 5–15)
BUN: 25 mg/dL — AB (ref 6–20)
CO2: 27 mmol/L (ref 22–32)
Calcium: 8.5 mg/dL — ABNORMAL LOW (ref 8.9–10.3)
Chloride: 101 mmol/L (ref 101–111)
Creatinine, Ser: 1.74 mg/dL — ABNORMAL HIGH (ref 0.61–1.24)
GFR calc Af Amer: 47 mL/min — ABNORMAL LOW (ref >60)
GFR, EST NON AFRICAN AMERICAN: 41 mL/min — AB (ref >60)
GLUCOSE: 121 mg/dL — AB (ref 65–99)
POTASSIUM: 2.9 mmol/L — AB (ref 3.5–5.1)
Sodium: 136 mmol/L (ref 135–145)

## 2017-07-29 LAB — IRON AND TIBC
Iron: <5 ug/dL — ABNORMAL LOW (ref 45–182)
TIBC: 331 ug/dL (ref 250–450)

## 2017-07-29 LAB — ABO/RH: ABO/RH(D): B POS

## 2017-07-29 LAB — MAGNESIUM: Magnesium: 1.8 mg/dL (ref 1.7–2.4)

## 2017-07-29 LAB — PREPARE RBC (CROSSMATCH)

## 2017-07-29 LAB — FERRITIN: FERRITIN: 5 ng/mL — AB (ref 24–336)

## 2017-07-29 MED ORDER — ALBUTEROL SULFATE (2.5 MG/3ML) 0.083% IN NEBU: 2.5000 mg | INHALATION_SOLUTION | RESPIRATORY_TRACT | Status: DC | PRN

## 2017-07-29 MED ORDER — ACETAMINOPHEN 325 MG PO TABS: 650.0000 mg | ORAL_TABLET | Freq: Four times a day (QID) | ORAL | Status: DC | PRN

## 2017-07-29 MED ORDER — SODIUM CHLORIDE 0.9 % IV SOLN
10.0000 mL/h | Freq: Once | INTRAVENOUS | Status: AC
  Administered 2017-07-29: 10 mL/h via INTRAVENOUS

## 2017-07-29 MED ORDER — POTASSIUM CHLORIDE CRYS ER 20 MEQ PO TBCR
40.0000 meq | EXTENDED_RELEASE_TABLET | Freq: Once | ORAL | Status: AC
  Administered 2017-07-29: 18:00:00 40 meq via ORAL
  Filled 2017-07-29: qty 2

## 2017-07-29 MED ORDER — BISACODYL 5 MG PO TBEC: 5.0000 mg | DELAYED_RELEASE_TABLET | Freq: Every day | ORAL | Status: DC | PRN

## 2017-07-29 MED ORDER — POLYETHYLENE GLYCOL 3350 17 G PO PACK
17.0000 g | PACK | Freq: Every day | ORAL | Status: DC
  Administered 2017-07-30 – 2017-08-01 (×3): 17 g via ORAL
  Filled 2017-07-29 (×3): qty 1

## 2017-07-29 MED ORDER — HYDROCODONE-ACETAMINOPHEN 5-325 MG PO TABS: 1.0000 | ORAL_TABLET | ORAL | Status: DC | PRN

## 2017-07-29 MED ORDER — PANTOPRAZOLE SODIUM 40 MG IV SOLR
40.0000 mg | Freq: Two times a day (BID) | INTRAVENOUS | Status: DC
  Administered 2017-07-29 – 2017-08-02 (×8): 40 mg via INTRAVENOUS
  Filled 2017-07-29 (×8): qty 40

## 2017-07-29 MED ORDER — SENNOSIDES-DOCUSATE SODIUM 8.6-50 MG PO TABS: 1.0000 | ORAL_TABLET | Freq: Every evening | ORAL | Status: DC | PRN

## 2017-07-29 MED ORDER — PHENYTOIN SODIUM EXTENDED 100 MG PO CAPS
400.0000 mg | ORAL_CAPSULE | Freq: Every day | ORAL | Status: DC
  Administered 2017-07-29 – 2017-08-01 (×4): 400 mg via ORAL
  Filled 2017-07-29 (×5): qty 4

## 2017-07-29 MED ORDER — POTASSIUM CHLORIDE IN NACL 40-0.9 MEQ/L-% IV SOLN
INTRAVENOUS | Status: DC
Start: 2017-07-29 — End: 2017-08-01
  Administered 2017-07-29 – 2017-08-01 (×3): 100 mL/h via INTRAVENOUS
  Filled 2017-07-29 (×9): qty 1000

## 2017-07-29 MED ORDER — ONDANSETRON HCL 4 MG/2ML IJ SOLN: 4.0000 mg | Freq: Four times a day (QID) | INTRAMUSCULAR | Status: DC | PRN

## 2017-07-29 MED ORDER — ACETAMINOPHEN 650 MG RE SUPP: 650.0000 mg | Freq: Four times a day (QID) | RECTAL | Status: DC | PRN

## 2017-07-29 MED ORDER — ONDANSETRON HCL 4 MG PO TABS: 4.0000 mg | ORAL_TABLET | Freq: Four times a day (QID) | ORAL | Status: DC | PRN

## 2017-07-29 NOTE — ED Triage Notes (Signed)
Pt comes into the ED via [facility] from home c/o syncopal episode.  Pt family stated he has passed out 3 times today with the last one including shaking of his head right before he "passed out".  CBG 186 and BP 103/61, HR 78.  Patient able to answer questions at this time for EMS and nursing staff.  Patient has h/o stroke, seizures.
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
## 2017-07-29 NOTE — ED Notes (Signed)
Floor called and is changing rooms. Room on floor must be cleaned prior to accepting pt. No ETA given.

## 2017-07-29 NOTE — ED Notes (Signed)
Called to give report, was told that the CN was calling the supervisor regarding pt assignment and would call back to me.

## 2017-07-29 NOTE — ED Notes (Signed)
Admission MD at bedside.  

## 2017-07-29 NOTE — ED Notes (Signed)
Md notified that patient has left sided weakness from previous stroke and will not be able to complete orthostatic Vitals signs due to this as well as having a Hgb of 4.9.  Md verbalized understanding of this information.

## 2017-07-29 NOTE — ED Notes (Signed)
RM 116

## 2017-07-29 NOTE — H&P (Addendum)
[employer] [facility] - [place] [facility]   PATIENT NAME: [name]    MR#:  [id number]  DATE OF BIRTH:  1955-10-26  DATE OF ADMISSION:  07/29/2017  PRIMARY CARE PHYSICIAN: Derwood Kaplan, MD   REQUESTING/REFERRING PHYSICIAN:  Governor Rooks, MD   CHIEF COMPLAINT:   Chief Complaint  Patient presents with  . Loss of Consciousness   Loss of Consciousness today. HISTORY OF PRESENT ILLNESS:  Keary Waterson  is a 61 y.o. male with a known history of hypertension, stroke and a seizure disorder.  The patient was sent to the ED due to the above chief complaints.  The patient has had 3 episodes of syncope at home today.  He feels dizzy and generalized weakness but he denies any chest pain, palpitation, slurred speech, focal weakness, shaking or incontinence.  He is legally blind and poor historian.  He was found low hemoglobin at 4.9 in ED. but he denies any melena or bloody stool or hematuria.  He denies any using of NSAIDs.  PAST MEDICAL HISTORY:   Past Medical History:  Diagnosis Date  . Hypertension   . Seizures (HCC)   . Stroke [facility])     PAST SURGICAL HISTORY:   Past Surgical History:  Procedure Laterality Date  . CLOSED REDUCTION CRANIOFACIAL SEPARATION    . LUNG REMOVAL, PARTIAL Right     SOCIAL HISTORY:   Social History   Tobacco Use  . Smoking status: Former Games developer  . Smokeless tobacco: Never Used  Substance Use Topics  . Alcohol use: No    FAMILY HISTORY:   Family History  Problem Relation Age of Onset  . Hypertension Mother   . CAD Mother     DRUG ALLERGIES:   Allergies  Allergen Reactions  . Iodine Anaphylaxis and Itching  . Shellfish Allergy Hives and Swelling    REVIEW OF SYSTEMS:   Review of Systems  Constitutional: Positive for malaise/fatigue. Negative for chills and fever.  HENT: Negative for sore throat.   Eyes: Negative for blurred vision and double vision.  Respiratory: Negative for cough, hemoptysis, shortness of  breath, wheezing and stridor.   Cardiovascular: Negative for chest pain, palpitations, orthopnea and leg swelling.  Gastrointestinal: Negative for abdominal pain, blood in stool, diarrhea, melena, nausea and vomiting.  Genitourinary: Negative for dysuria, flank pain and hematuria.  Musculoskeletal: Negative for back pain and joint pain.  Skin: Negative for rash.  Neurological: Positive for dizziness, loss of consciousness and weakness. Negative for sensory change, focal weakness, seizures and headaches.  Endo/Heme/Allergies: Negative for polydipsia.  Psychiatric/Behavioral: Negative for depression. The patient is not nervous/anxious.     MEDICATIONS AT HOME:   Prior to Admission medications   Medication Sig Start Date End Date Taking? Authorizing Provider  aspirin EC 81 MG EC tablet Take 1 tablet (81 mg total) by mouth daily. 02/16/17   Auburn Bilberry, MD  atorvastatin (LIPITOR) 40 MG tablet Take 1 tablet (40 mg total) by mouth daily at 6 PM. 02/15/17   Auburn Bilberry, MD  cloNIDine (CATAPRES) 0.2 MG tablet Take 1 tablet (0.2 mg total) by mouth 2 (two) times daily. 02/15/17   Auburn Bilberry, MD  hydrALAZINE (APRESOLINE) 100 MG tablet Take 100 mg by mouth 3 (three) times daily. 04/19/17   [provider]  hydrochlorothiazide (HYDRODIURIL) 25 MG tablet Take 1 tablet (25 mg total) by mouth daily. Patient not taking: Reported on 04/16/2017 04/13/17   Enedina Finner, MD  hydrOXYzine (ATARAX/VISTARIL) 25 MG tablet Take 25  mg by mouth every 6 (six) hours as needed.    [provider]  omeprazole (PRILOSEC) 20 MG capsule Take 40 mg by mouth daily.    [provider]  phenytoin (DILANTIN) 100 MG ER capsule Take 400 mg by mouth at bedtime.     [provider]  polyethylene glycol (MIRALAX / GLYCOLAX) packet Take 17 g by mouth daily. 02/15/17   Auburn BilberryPatel, Shreyang, MD  potassium chloride (K-DUR) 10 MEQ tablet Take 1 tablet (10 mEq total) by mouth daily. 04/19/17   Sharyn CreamerQuale, Mark,  MD      VITAL SIGNS:  Blood pressure 103/70, pulse 74, temperature 97.8 F (36.6 C), temperature source Oral, resp. rate 15, height 6\' 1"  (1.854 m), weight 186 lb 12.8 oz (84.7 kg), SpO2 100 %.  PHYSICAL EXAMINATION:  Physical Exam  GENERAL:  61 y.o.-year-old patient lying in the bed with no acute distress.  EYES: Legally blind.   HEENT: Head atraumatic, normocephalic. Oropharynx and nasopharynx clear.  Dry oral mucosa. NECK:  Supple, no jugular venous distention. No thyroid enlargement, no tenderness.  LUNGS: Normal breath sounds bilaterally, no wheezing, rales,rhonchi or crepitation. No use of accessory muscles of respiration.  CARDIOVASCULAR: S1, S2 normal. No murmurs, rubs, or gallops.  ABDOMEN: Soft, nontender, nondistended. Bowel sounds present. No organomegaly or mass.  EXTREMITIES: No pedal edema, cyanosis, or clubbing.  NEUROLOGIC: Cranial nerves II through XII are intact. Muscle strength 4/5 in all extremities. Sensation intact. Gait not checked.  PSYCHIATRIC: The patient is alert and oriented x 3.  SKIN: No obvious rash, lesion, or ulcer.   LABORATORY PANEL:   CBC Recent Labs  Lab 07/29/17 1158  WBC 9.1  HGB 4.9*  HCT 16.2*  PLT 534*   ------------------------------------------------------------------------------------------------------------------  Chemistries  Recent Labs  Lab 07/29/17 1158  NA 136  K 2.9*  CL 101  CO2 27  GLUCOSE 121*  BUN 25*  CREATININE 1.74*  CALCIUM 8.5*   ------------------------------------------------------------------------------------------------------------------  Cardiac Enzymes No results for input(s): TROPONINI in the last 168 hours. ------------------------------------------------------------------------------------------------------------------  RADIOLOGY:  No results found.    IMPRESSION AND PLAN:   Severe anemia.  The patient will be immediately to medical floor. Type and cross, PRBC transfusion.   Follow-up hemoglobin in a.m.  Hold aspirin. N.p.o. except medication, IV fluid support. Start Protonix IV every 12 hours, GI consult for possible GI bleeding.  Stool occult is negative, will repeat.  Anemia workup.  Hypokalemia.  Give potassium supplement, follow-up BMP and magnesium level.  Hold HCTZ.  Syncope.  Possible due to anemia and orthostatic hypotension.  Hypertension.  Hold home hypertension medication due to low side blood pressure. CKD.  Stage III.  Stable.  Seizure disorder.  Continue Dilantin.   All the records are reviewed and case discussed with ED provider. Management plans discussed with the patient, his wife and sister and they are in agreement.  CODE STATUS: Full Code  TOTAL TIME TAKING CARE OF THIS PATIENT: 58 minutes.    Shaune Pollackhen, Makara Lanzo M.D on 07/29/2017 at 3:15 PM  Between 7am to 6pm - Pager - [phone]  After 6pm go to [email or web address] - Social research officer, [email or web address] [email or web address]  [facility] [place] [facility]  Office  [phone]  CC: Primary care physician; [name], Ernest B, MD   Note: This dictation was prepared with Dragon dictation along with smaller phrase technology. Any transcriptional errors that result from this process are unin

## 2017-07-30 ENCOUNTER — Encounter: Payer: Self-pay | Admitting: *Deleted

## 2017-07-30 LAB — BPAM RBC
BLOOD PRODUCT EXPIRATION DATE: 201811212359
Blood Product Expiration Date: 201811212359
ISSUE DATE / TIME: 201811041821
ISSUE DATE / TIME: 201811042153
UNIT TYPE AND RH: 7300
UNIT TYPE AND RH: 7300

## 2017-07-30 LAB — BASIC METABOLIC PANEL
ANION GAP: 6 (ref 5–15)
BUN: 19 mg/dL (ref 6–20)
CHLORIDE: 104 mmol/L (ref 101–111)
CO2: 27 mmol/L (ref 22–32)
Calcium: 8.5 mg/dL — ABNORMAL LOW (ref 8.9–10.3)
Creatinine, Ser: 1.4 mg/dL — ABNORMAL HIGH (ref 0.61–1.24)
GFR calc non Af Amer: 53 mL/min — ABNORMAL LOW (ref >60)
Glucose, Bld: 106 mg/dL — ABNORMAL HIGH (ref 65–99)
POTASSIUM: 3.9 mmol/L (ref 3.5–5.1)
SODIUM: 137 mmol/L (ref 135–145)

## 2017-07-30 LAB — TYPE AND SCREEN
ABO/RH(D): B POS
Antibody Screen: NEGATIVE
UNIT DIVISION: 0
Unit division: 0

## 2017-07-30 LAB — VITAMIN B12: Vitamin B-12: 934 pg/mL — ABNORMAL HIGH (ref 180–914)

## 2017-07-30 LAB — CBC
HCT: 25 % — ABNORMAL LOW (ref 40.0–52.0)
HEMOGLOBIN: 7.8 g/dL — AB (ref 13.0–18.0)
MCH: 22.1 pg — ABNORMAL LOW (ref 26.0–34.0)
MCHC: 31.1 g/dL — ABNORMAL LOW (ref 32.0–36.0)
MCV: 71 fL — AB (ref 80.0–100.0)
PLATELETS: 562 10*3/uL — AB (ref 150–440)
RBC: 3.52 MIL/uL — AB (ref 4.40–5.90)
RDW: 24.2 % — ABNORMAL HIGH (ref 11.5–14.5)
WBC: 10 10*3/uL (ref 3.8–10.6)

## 2017-07-30 LAB — TROPONIN I: Troponin I: <0.03 ng/mL (ref <0.03)

## 2017-07-30 MED ORDER — MIDAZOLAM HCL 2 MG/2ML IJ SOLN
INTRAMUSCULAR | Status: AC
  Filled 2017-07-30: qty 2

## 2017-07-30 MED ORDER — SODIUM CHLORIDE 0.9 % IV SOLN
INTRAVENOUS | Status: DC
  Administered 2017-07-31 (×2): via INTRAVENOUS

## 2017-07-30 MED ORDER — LIDOCAINE HCL (PF) 2 % IJ SOLN
INTRAMUSCULAR | Status: AC
  Filled 2017-07-30: qty 10

## 2017-07-30 MED ORDER — CLONIDINE HCL 0.1 MG PO TABS
0.2000 mg | ORAL_TABLET | Freq: Two times a day (BID) | ORAL | Status: DC
  Administered 2017-07-30 – 2017-08-01 (×5): 0.2 mg via ORAL
  Filled 2017-07-30 (×5): qty 2

## 2017-07-30 MED ORDER — ATORVASTATIN CALCIUM 20 MG PO TABS
40.0000 mg | ORAL_TABLET | Freq: Every day | ORAL | Status: DC
  Administered 2017-07-30 – 2017-08-02 (×4): 40 mg via ORAL
  Filled 2017-07-30 (×4): qty 2

## 2017-07-30 MED ORDER — PROPOFOL 10 MG/ML IV BOLUS
INTRAVENOUS | Status: AC
  Filled 2017-07-30: qty 20

## 2017-07-30 MED ORDER — SODIUM CHLORIDE 0.9 % IV SOLN
200.0000 mg | Freq: Once | INTRAVENOUS | Status: AC
  Administered 2017-07-30: 11:00:00 200 mg via INTRAVENOUS
  Filled 2017-07-30: qty 10

## 2017-07-30 MED ORDER — HYDRALAZINE HCL 50 MG PO TABS
100.0000 mg | ORAL_TABLET | Freq: Three times a day (TID) | ORAL | Status: DC
  Administered 2017-07-30 – 2017-08-02 (×8): 100 mg via ORAL
  Filled 2017-07-30 (×9): qty 2

## 2017-07-30 MED ORDER — PHENYLEPHRINE HCL 10 MG/ML IJ SOLN
INTRAMUSCULAR | Status: AC
  Filled 2017-07-30: qty 1
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
## 2017-07-30 NOTE — Anesthesia Preprocedure Evaluation (Signed)
Anesthesia Evaluation  Patient identified by MRN, date of birth, ID band Patient awake    Reviewed: Allergy & Precautions, H&P , NPO status , Patient's Chart, lab work & pertinent test results  History of Anesthesia Complications Negative for: history of anesthetic complications  Airway Mallampati: III  TM Distance: >3 FB Neck ROM: full    Dental  (+) Chipped, Poor Dentition, Missing   Pulmonary neg shortness of breath, former smoker,           Cardiovascular hypertension,      Neuro/Psych Seizures -,  CVA negative psych ROS   GI/Hepatic negative GI ROS, Neg liver ROS,   Endo/Other  negative endocrine ROS  Renal/GU negative Renal ROS  negative genitourinary   Musculoskeletal   Abdominal   Peds  Hematology negative hematology ROS (+)   Anesthesia Other Findings Past Medical History: No date: Hypertension No date: Seizures (HCC) No date: Stroke [facility](HCC)  Past Surgical History: No date: blind No date: CLOSED REDUCTION CRANIOFACIAL SEPARATION No date: LUNG REMOVAL, PARTIAL; Right  BMI    Body Mass Index:  21.44 kg/m      Reproductive/Obstetrics negative OB ROS                             Anesthesia Physical Anesthesia Plan  ASA: III  Anesthesia Plan:    Post-op Pain Management:    Induction: Intravenous  PONV Risk Score and Plan: 1 and Propofol infusion  Airway Management Planned: Natural Airway and Nasal Cannula  Additional Equipment:   Intra-op Plan:   Post-operative Plan:   Informed Consent: I have reviewed the patients History and Physical, chart, labs and discussed the procedure including the risks, benefits and alternatives for the proposed anesthesia with the patient or authorized representative who has indicated his/her understanding and acceptance.   Dental Advisory Given  Plan Discussed with: Anesthesiologist, CRNA and Surgeon  Anesthesia Plan Comments:  (Patient and wife consented for risks of anesthesia including but not limited to:  - adverse reactions to medications - risk of intubation if required - damage to teeth, lips or other oral mucosa - sore throat or hoarseness - Damage to heart, brain, lungs or loss of life  They voiced understanding.)        Anesthesia Quick Evaluation
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
## 2017-07-31 ENCOUNTER — Inpatient Hospital Stay: Payer: Medicare Other | Admitting: Certified Registered"

## 2017-07-31 ENCOUNTER — Encounter: Payer: Self-pay | Admitting: Anesthesiology

## 2017-07-31 ENCOUNTER — Encounter
Admission: EM | Disposition: A | Discharge status: Home / Self Care | Payer: Self-pay | Source: Home / Self Care | Attending: Specialist

## 2017-07-31 DIAGNOSIS — D649 Anemia, unspecified: Secondary | ICD-10-CM

## 2017-07-31 DIAGNOSIS — R55 Syncope and collapse: Secondary | ICD-10-CM

## 2017-07-31 DIAGNOSIS — D62 Acute posthemorrhagic anemia: Principal | ICD-10-CM

## 2017-07-31 DIAGNOSIS — I8500 Esophageal varices without bleeding: Secondary | ICD-10-CM

## 2017-07-31 DIAGNOSIS — K210 Gastro-esophageal reflux disease with esophagitis: Secondary | ICD-10-CM

## 2017-07-31 HISTORY — PX: ESOPHAGOGASTRODUODENOSCOPY (EGD) WITH PROPOFOL: SHX5813

## 2017-07-31 LAB — BASIC METABOLIC PANEL
Anion gap: 6 (ref 5–15)
BUN: 13 mg/dL (ref 6–20)
CHLORIDE: 104 mmol/L (ref 101–111)
CO2: 26 mmol/L (ref 22–32)
CREATININE: 1.27 mg/dL — AB (ref 0.61–1.24)
Calcium: 9 mg/dL (ref 8.9–10.3)
GFR, EST NON AFRICAN AMERICAN: 60 mL/min — AB (ref >60)
Glucose, Bld: 95 mg/dL (ref 65–99)
POTASSIUM: 4.4 mmol/L (ref 3.5–5.1)
SODIUM: 136 mmol/L (ref 135–145)

## 2017-07-31 LAB — CBC
HCT: 29 % — ABNORMAL LOW (ref 40.0–52.0)
Hemoglobin: 8.7 g/dL — ABNORMAL LOW (ref 13.0–18.0)
MCH: 21.4 pg — ABNORMAL LOW (ref 26.0–34.0)
MCHC: 29.9 g/dL — AB (ref 32.0–36.0)
MCV: 71.6 fL — ABNORMAL LOW (ref 80.0–100.0)
PLATELETS: 662 10*3/uL — AB (ref 150–440)
RBC: 4.06 MIL/uL — ABNORMAL LOW (ref 4.40–5.90)
RDW: 24.4 % — AB (ref 11.5–14.5)
WBC: 11.6 10*3/uL — AB (ref 3.8–10.6)

## 2017-07-31 SURGERY — ESOPHAGOGASTRODUODENOSCOPY (EGD) WITH PROPOFOL
Anesthesia: General

## 2017-07-31 MED ORDER — PROPOFOL 500 MG/50ML IV EMUL
INTRAVENOUS | Status: DC | PRN
  Administered 2017-07-31: 160 ug/kg/min via INTRAVENOUS

## 2017-07-31 MED ORDER — FENTANYL CITRATE (PF) 100 MCG/2ML IJ SOLN
INTRAMUSCULAR | Status: DC | PRN
  Administered 2017-07-31 (×2): 50 ug via INTRAVENOUS

## 2017-07-31 MED ORDER — PROPOFOL 10 MG/ML IV BOLUS
INTRAVENOUS | Status: DC | PRN
  Administered 2017-07-31: 100 mg via INTRAVENOUS

## 2017-07-31 MED ORDER — PROPOFOL 500 MG/50ML IV EMUL
INTRAVENOUS | Status: AC
  Filled 2017-07-31: qty 100

## 2017-07-31 MED ORDER — FENTANYL CITRATE (PF) 100 MCG/2ML IJ SOLN
INTRAMUSCULAR | Status: AC
  Filled 2017-07-31: qty 2

## 2017-07-31 NOTE — Op Note (Signed)
[facility] Gastroenterology Patient Name: Jerry ChurnDennis Szafran Procedure Date: 07/31/2017 11:26 AM MRN: [id number] Account #: [id number] Date of Birth: Jun 14, 1956 Admit Type: Inpatient Age: [age] Room: [facility] ENDO ROOM 4 Gender: Male Note Status: Finalized Procedure:            Upper GI endoscopy Indications:          Acute post hemorrhagic anemia Providers:            Merced Brougham B. Maximino Greenlandahiliani MD, MD Referring MD:         Serita ShellerErnest B. Maryellen PileEason MD, MD (Referring MD) Medicines:            Monitored Anesthesia Care Complications:        No immediate complications. Procedure:            Pre-Anesthesia Assessment:                       - ASA Grade Assessment: III - A patient with severe                        systemic disease.                       - After reviewing the risks and benefits, the patient                        was deemed in satisfactory condition to undergo the                        procedure.                       - Monitored anesthesia care was determined to be                        medically necessary for this procedure based on review                        of the patient's medical history, medications, and                        prior anesthesia history.                       After obtaining informed consent, the endoscope was                        passed under direct vision. Throughout the procedure,                        the patient's blood pressure, pulse, and oxygen                        saturations were monitored continuously. The Endoscope                        was introduced through the mouth, and advanced to the                        second part of duodenum. The upper GI endoscopy was  accomplished with ease. The patient tolerated the                        procedure well. Findings:      LA Grade D (one or more mucosal breaks involving at least 75% of       esophageal circumference) esophagitis with no active bleeding was found    in the distal esophagus (erythema present at the site).      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      The entire examined stomach was normal.      The cardia and gastric fundus were normal on retroflexion.      The erythema present around the site of the Grade D esophagitis       signifies the most likely source of his anemia. The isolated Varix       mentioned above did not have any signs of active or recent bleeding (no       red wale sign present).      A medium-sized hiatal hernia was present. Impression:           - LA Grade D reflux esophagitis with erythema present                        but no active bleeding. This is a potential source of                        his anemia.                       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                       - Normal stomach.                       - No old or new blood present throughout the exam                       - No specimens collected. Recommendation:       - Observe patient in Medical Floor for ongoing care.                       - Use Protonix (pantoprazole) 40 mg IV BID.                       - Pt. will need a colonoscopy since he has never had                        one before and he is likely best suited to have one now  since he is inpatient and given his medical condition,                        it will likely be difficult to have him complete a prep                        as outpatient                       - This can be done in 1-2 days depending on how well                        patient can complete his prep                       - Primary team can choose to start regular diet today                        as family was insistent pt would like to eat. Then                         start clear liquid diet tomorrow along with colon prep                        tomorrow and colonoscopy the day after tomorrow.                       - Repeat EGD in 8-10 weeks to evaluate Esophagitis for                        any underlying Barrett's or need for biopsies.                       - Patient should be discharged on Protonix 40 mg BID                       - Avoid NSAIDs Procedure Code(s):    --- Professional ---                       [id number], Esophagogastroduodenoscopy, flexible, transoral;                        diagnostic, including collection of specimen(s) by                        brushing or washing, when performed (separate procedure) Diagnosis Code(s):    --- Professional ---                       D62, Acute posthemorrhagic anemia                       I85.00, Esophageal varices without bleeding                       K21.0, Gastro-esophageal reflux disease with esophagitis CPT copyright 2016 American Medical Association. All rights reserved. The codes documented in this report are preliminary and upon coder review may  be revised to meet current compliance requirements.  Melodie Bouillon, MD Michel Bickers B. Stevin Bielinski MD, MD 07/31/2017 12:40:14  PM This report has been signed electronically. Number of Addenda: 0 Note Initiated On: 07/31/2017 11:26 AM      [facility]

## 2017-07-31 NOTE — Anesthesia Post-op Follow-up Note (Signed)
Anesthesia QCDR form completed.        
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
## 2017-07-31 NOTE — Consult Note (Signed)
Melodie Bouillon, MD [address], [address], [place], [place], [address] [address], [address], [place], [place], [address] Phone: [phone]  Fax: [phone]  Consultation  Referring Provider:     Dr. Cherlynn Kaiser Primary Care Physician:  Derwood Kaplan, MD Primary Gastroenterologist:  Dr. Maximino Greenland         Reason for Consultation:     Anemia  Date of Admission:  07/29/2017 Date of Consultation:  07/31/2017         HPI:   [name] is a 61 y.o. male presents with syncope at home and found to have anemia and Hgb in the 4's on presentation with no signs of active GI bleeding. Pt is blind and is hard to understand with his history of stroke. No known hx of colonoscopy. Denies abdominal pain. Tolerating clear liquids. Hx obtained from nurse and chart.   Past Medical History:  Diagnosis Date  . Hypertension   . Seizures (HCC)   . Stroke [facility])     Past Surgical History:  Procedure Laterality Date  . blind    . CLOSED REDUCTION CRANIOFACIAL SEPARATION    . LUNG REMOVAL, PARTIAL Right     Prior to Admission medications   Medication Sig Start Date End Date Taking? Authorizing Provider  aspirin EC 81 MG EC tablet Take 1 tablet (81 mg total) by mouth daily. 02/16/17  Yes Auburn Bilberry, MD  atorvastatin (LIPITOR) 40 MG tablet Take 1 tablet (40 mg total) by mouth daily at 6 PM. Patient taking differently: Take 40 mg daily by mouth.  02/15/17  Yes Auburn Bilberry, MD  cloNIDine (CATAPRES) 0.2 MG tablet Take 1 tablet (0.2 mg total) by mouth 2 (two) times daily. 02/15/17  Yes Auburn Bilberry, MD  hydrALAZINE (APRESOLINE) 100 MG tablet Take 100 mg by mouth 3 (three) times daily. 04/19/17  Yes [provider]  hydrochlorothiazide (HYDRODIURIL) 25 MG tablet Take 1 tablet (25 mg total) by mouth daily. 04/13/17  Yes Enedina Finner, MD  hydrOXYzine (ATARAX/VISTARIL) 25 MG tablet Take 25 mg by mouth every 6 (six) hours as needed.   Yes [provider]  omeprazole (PRILOSEC) 20  MG capsule Take 40 mg by mouth daily.   Yes [provider]  phenytoin (DILANTIN) 100 MG ER capsule Take 400 mg by mouth at bedtime.    Yes [provider]  polyethylene glycol (MIRALAX / GLYCOLAX) packet Take 17 g by mouth daily. Patient taking differently: Take 17 g daily as needed by mouth.  02/15/17  Yes Auburn Bilberry, MD  potassium chloride (K-DUR) 10 MEQ tablet Take 1 tablet (10 mEq total) by mouth daily. Patient not taking: Reported on 07/29/2017 04/19/17   Sharyn Creamer, MD    Family History  Problem Relation Age of Onset  . Hypertension Mother   . CAD Mother      Social History   Tobacco Use  . Smoking status: Former Games developer  . Smokeless tobacco: Never Used  Substance Use Topics  . Alcohol use: No  . Drug use: No    Allergies as of 07/29/2017 - Review Complete 07/29/2017  Allergen Reaction Noted  . Iodine Anaphylaxis and Itching 04/16/2017  . Shellfish allergy Hives and Swelling 02/13/2017    Review of Systems:    All systems reviewed and negative except where noted in HPI.   Physical Exam:  Vital signs in last 24 hours: Temp:  [97.5 F (36.4 C)-98.6 F (37 C)] 98.6 F (37 C) (11/06 0503) Pulse Rate:  [77-88] 82 (  11/06 0503) Resp:  [18-20] 19 (11/06 0503) BP: (140-168)/(80-94) 168/93 (11/06 0503) SpO2:  [100 %] 100 % (11/05 2127) Weight:  [75.8 kg (167 lb)] 75.8 kg (167 lb) (11/05 1452) Last BM Date: 07/27/17 General:   Pleasant, cooperative in NAD Head:  Normocephalic and atraumatic. Eyes:   No icterus.    Ears:  Normal auditory acuity. Neck:  Supple; no masses or thyroidomegaly Lungs: Respirations even and unlabored. Lungs clear to auscultation bilaterally.   No wheezes, crackles, or rhonchi.  Heart:  Regular rate and rhythm;  Without murmur, clicks, rubs or gallops Abdomen:  Soft, nondistended, nontender. Normal bowel sounds. No appreciable masses or hepatomegaly.  No rebound or guarding.  Rectal:  Not performed. Msk:  Symmetrical  without gross deformities.  Extremities:  Without edema, cyanosis or clubbing. Neurologic:  Alert and oriented x3;  grossly normal neurologically. Skin:  Intact without significant lesions or rashes. Cervical Nodes:  No significant cervical adenopathy. Psych:  Alert and cooperative. Normal affect.  LAB RESULTS: Recent Labs    07/29/17 1158 07/30/17 0440 07/31/17 0620  WBC 9.1 10.0 11.6*  HGB 4.9* 7.8* 8.7*  HCT 16.2* 25.0* 29.0*  PLT 534* 562* 662*   BMET Recent Labs    07/29/17 1158 07/30/17 0440  NA 136 137  K 2.9* 3.9  CL 101 104  CO2 27 27  GLUCOSE 121* 106*  BUN 25* 19  CREATININE 1.74* 1.40*  CALCIUM 8.5* 8.5*   LFT No results for input(s): PROT, ALBUMIN, AST, ALT, ALKPHOS, BILITOT, BILIDIR, IBILI in the last 72 hours. PT/INR No results for input(s): LABPROT, INR in the last 72 hours.  STUDIES: No results found.    Impression / Plan:   [name] is a 61 y.o. y/o male with syncope at home and found to be acutely anemic on presentation with no known hx of colonoscopy or EGD and no signs of active GI bleeding  Acute anemia needs evaluation Will perform EGD today Continue Protonix IV BID If negative will need to prep for colonoscopy and patient may need a slow prep as his ability to tolerate it all in one day might be questionable Avoid NSAIDs Continue Serial CBCs and transfuse PRN  Thank you for involving me in the care of this patient.      LOS: 2 days   Pasty SpillersVarnita B Amoni Scallan, MD  07/31/2017, 9:25 AM

## 2017-07-31 NOTE — Transfer of Care (Signed)
Immediate Anesthesia Transfer of Care Note  Patient: Kelli ChurnDennis Sonnenberg  Procedure(s) Performed: ESOPHAGOGASTRODUODENOSCOPY (EGD) WITH PROPOFOL (N/A )  Patient Location: PACU and Endoscopy Unit  Anesthesia Type:General  Level of Consciousness: drowsy  Airway & Oxygen Therapy: Patient Spontanous Breathing and Patient connected to nasal cannula oxygen  Post-op Assessment: Report given to RN and Post -op Vital signs reviewed and stable  Post vital signs: Reviewed and stable  Last Vitals:  Vitals:   07/31/17 0503 07/31/17 1122  BP: (!) 168/93 (!) 164/88  Pulse: 82 79  Resp: 19 16  Temp: 37 C (!) 35.9 C  SpO2:  100%    Last Pain:  Vitals:   07/31/17 1122  TempSrc: Tympanic  PainSc:       Patients Stated Pain Goal: 0 (07/30/17 1452)  Complications: No apparent anesthesia complications

## 2017-08-01 ENCOUNTER — Encounter: Payer: Self-pay | Admitting: Gastroenterology

## 2017-08-01 LAB — URINALYSIS, COMPLETE (UACMP) WITH MICROSCOPIC
Bilirubin Urine: NEGATIVE
GLUCOSE, UA: NEGATIVE mg/dL
HGB URINE DIPSTICK: NEGATIVE
KETONES UR: NEGATIVE mg/dL
NITRITE: NEGATIVE
PH: 5 (ref 5.0–8.0)
PROTEIN: NEGATIVE mg/dL
Specific Gravity, Urine: 1.016 (ref 1.005–1.030)
Squamous Epithelial / LPF: NONE SEEN

## 2017-08-01 LAB — CBC
HCT: 28.3 % — ABNORMAL LOW (ref 40.0–52.0)
Hemoglobin: 8.6 g/dL — ABNORMAL LOW (ref 13.0–18.0)
MCH: 22 pg — AB (ref 26.0–34.0)
MCHC: 30.4 g/dL — AB (ref 32.0–36.0)
MCV: 72.4 fL — ABNORMAL LOW (ref 80.0–100.0)
PLATELETS: 543 10*3/uL — AB (ref 150–440)
RBC: 3.9 MIL/uL — ABNORMAL LOW (ref 4.40–5.90)
RDW: 24.6 % — ABNORMAL HIGH (ref 11.5–14.5)
WBC: 12.5 10*3/uL — ABNORMAL HIGH (ref 3.8–10.6)

## 2017-08-01 MED ORDER — BISACODYL 5 MG PO TBEC
10.0000 mg | DELAYED_RELEASE_TABLET | Freq: Once | ORAL | Status: AC
Start: 2017-08-01 — End: 2017-08-01
  Administered 2017-08-01: 10 mg via ORAL
  Filled 2017-08-01: qty 2

## 2017-08-01 MED ORDER — SODIUM CHLORIDE 0.9 % IV SOLN: INTRAVENOUS | Status: DC

## 2017-08-01 MED ORDER — POLYETHYLENE GLYCOL 3350 17 GM/SCOOP PO POWD
1.0000 | Freq: Once | ORAL | Status: AC
  Administered 2017-08-01: 22:00:00 255 g via ORAL
  Filled 2017-08-01: qty 255

## 2017-08-01 MED ORDER — DEXTROSE 5 % IV SOLN
1.0000 g | INTRAVENOUS | Status: DC
  Administered 2017-08-01 – 2017-08-02 (×2): 1 g via INTRAVENOUS
  Filled 2017-08-01 (×3): qty 10

## 2017-08-01 MED ORDER — SODIUM CHLORIDE 0.9 % IV SOLN
INTRAVENOUS | Status: DC
  Administered 2017-08-01 – 2017-08-02 (×2): via INTRAVENOUS

## 2017-08-01 MED ORDER — POLYETHYLENE GLYCOL 3350 17 GM/SCOOP PO POWD
1.0000 | Freq: Once | ORAL | Status: AC
  Administered 2017-08-01: 11:00:00 255 g via ORAL
  Filled 2017-08-01: qty 255

## 2017-08-01 NOTE — Anesthesia Postprocedure Evaluation (Signed)
Anesthesia Post Note  Patient: Jerry ChurnDennis Patton  Procedure(s) Performed: ESOPHAGOGASTRODUODENOSCOPY (EGD) WITH PROPOFOL (N/A )  Patient location during evaluation: Endoscopy Anesthesia Type: General Level of consciousness: awake and alert Pain management: pain level controlled Vital Signs Assessment: post-procedure vital signs reviewed and stable Respiratory status: spontaneous breathing, nonlabored ventilation, respiratory function stable and patient connected to nasal cannula oxygen Cardiovascular status: blood pressure returned to baseline and stable Postop Assessment: no apparent nausea or vomiting Anesthetic complications: no     Last Vitals:  Vitals:   07/31/17 1941 08/01/17 0437  BP: (!) 158/89 (!) 157/83  Pulse: 94 93  Resp: 18 (!) 22  Temp: 36.7 C 36.8 C  SpO2: 100% 100%    Last Pain:  Vitals:   08/01/17 0437  TempSrc: Oral  PainSc:                  Cleda MccreedyJoseph K Chrystopher Stangl
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
## 2017-08-01 NOTE — Progress Notes (Signed)
Patient resting in bed, wife at bedside. No complaints or signs of distress. Continue to monitor.

## 2017-08-01 NOTE — Care Management Important Message (Signed)
Important Message  Patient Details  Name: [name] MRN: [id number] Date of Birth: Jun 15, 1956   Medicare Important Message Given:  Yes    Gwenette GreetBrenda S Jaquitta Dupriest, RN 08/01/2017, 8:04 AM

## 2017-08-02 ENCOUNTER — Encounter: Payer: Self-pay | Admitting: *Deleted

## 2017-08-02 ENCOUNTER — Inpatient Hospital Stay: Payer: Medicare Other | Admitting: Anesthesiology

## 2017-08-02 ENCOUNTER — Encounter
Admission: EM | Disposition: A | Discharge status: Home / Self Care | Payer: Self-pay | Source: Home / Self Care | Attending: Specialist

## 2017-08-02 DIAGNOSIS — D509 Iron deficiency anemia, unspecified: Secondary | ICD-10-CM

## 2017-08-02 HISTORY — PX: COLONOSCOPY: SHX5424

## 2017-08-02 LAB — BASIC METABOLIC PANEL
Anion gap: 7 (ref 5–15)
BUN: 12 mg/dL (ref 6–20)
CALCIUM: 8.8 mg/dL — AB (ref 8.9–10.3)
CO2: 25 mmol/L (ref 22–32)
Chloride: 104 mmol/L (ref 101–111)
Creatinine, Ser: 1.22 mg/dL (ref 0.61–1.24)
GFR calc Af Amer: >60 mL/min (ref >60)
GLUCOSE: 93 mg/dL (ref 65–99)
Potassium: 4.2 mmol/L (ref 3.5–5.1)
Sodium: 136 mmol/L (ref 135–145)

## 2017-08-02 SURGERY — COLONOSCOPY
Anesthesia: General

## 2017-08-02 MED ORDER — FERROUS SULFATE 325 (65 FE) MG PO TABS: 325.0000 mg | ORAL_TABLET | Freq: Three times a day (TID) | ORAL | 0 refills | Status: AC

## 2017-08-02 MED ORDER — PROPOFOL 500 MG/50ML IV EMUL
INTRAVENOUS | Status: DC | PRN
  Administered 2017-08-02: 140 ug/kg/min via INTRAVENOUS

## 2017-08-02 MED ORDER — PANTOPRAZOLE SODIUM 40 MG PO TBEC
40.0000 mg | DELAYED_RELEASE_TABLET | Freq: Every day | ORAL | Status: DC
  Administered 2017-08-02: 18:00:00 40 mg via ORAL
  Filled 2017-08-02: qty 1

## 2017-08-02 MED ORDER — PROPOFOL 10 MG/ML IV BOLUS
INTRAVENOUS | Status: DC | PRN
  Administered 2017-08-02: 30 mg via INTRAVENOUS
  Administered 2017-08-02: 70 mg via INTRAVENOUS
  Administered 2017-08-02: 30 mg via INTRAVENOUS
  Administered 2017-08-02: 70 mg via INTRAVENOUS

## 2017-08-02 MED ORDER — MIDAZOLAM HCL 2 MG/2ML IJ SOLN
INTRAMUSCULAR | Status: AC
  Filled 2017-08-02: qty 2

## 2017-08-02 MED ORDER — PANTOPRAZOLE SODIUM 40 MG PO TBEC: 40.0000 mg | DELAYED_RELEASE_TABLET | Freq: Every day | ORAL | 0 refills | Status: DC

## 2017-08-02 MED ORDER — MIDAZOLAM HCL 2 MG/2ML IJ SOLN
INTRAMUSCULAR | Status: DC | PRN
Start: 2017-08-02 — End: 2017-08-02
  Administered 2017-08-02 (×2): 1 mg via INTRAVENOUS

## 2017-08-02 MED ORDER — PROPOFOL 500 MG/50ML IV EMUL
INTRAVENOUS | Status: AC
  Filled 2017-08-02: qty 50

## 2017-08-02 MED ORDER — FERROUS SULFATE 325 (65 FE) MG PO TABS
325.0000 mg | ORAL_TABLET | Freq: Three times a day (TID) | ORAL | Status: DC
  Administered 2017-08-02: 18:00:00 325 mg via ORAL
  Filled 2017-08-02: qty 1

## 2017-08-02 NOTE — Progress Notes (Signed)
Discharge instructions along with home medications and follow up gone over with patient and wife. Both verbalize that they understood instructions. 2 prescriptions given to patient. IV removed. Pt being discharged home on room air, no distress noted. Otilio JeffersonMadelyn S Fenton, RN

## 2017-08-02 NOTE — Discharge Instructions (Signed)
Heart healthy diet. °Fall precaution. °

## 2017-08-02 NOTE — Transfer of Care (Signed)
Immediate Anesthesia Transfer of Care Note  Patient: Kelli ChurnDennis Brasington  Procedure(s) Performed: COLONOSCOPY (N/A )  Patient Location: PACU  Anesthesia Type:General  Level of Consciousness: awake, alert  and oriented  Airway & Oxygen Therapy: Patient Spontanous Breathing and Patient connected to nasal cannula oxygen  Post-op Assessment: Report given to RN and Post -op Vital signs reviewed and stable  Post vital signs: Reviewed and stable  Last Vitals:  Vitals:   08/02/17 1200 08/02/17 1415  BP: (!) 156/95 (!) 136/92  Pulse: 87 80  Resp: (!) 22 (!) 27  Temp: (!) 36.1 C (!) 36 C  SpO2: 100% 100%    Last Pain:  Vitals:   08/02/17 1415  TempSrc: Tympanic  PainSc: Asleep      Patients Stated Pain Goal: 0 (07/30/17 1452)  Complications: No apparent anesthesia complications

## 2017-08-02 NOTE — Anesthesia Post-op Follow-up Note (Signed)
Anesthesia QCDR form completed.        

## 2017-08-02 NOTE — Progress Notes (Signed)
Medications administered by student RN [id number] with supervision of Clinical Instructor Gia Lusher MSN, RN-BC or patient's assigned RN.   

## 2017-08-02 NOTE — Anesthesia Postprocedure Evaluation (Signed)
Anesthesia Post Note  Patient: Jerry ChurnDennis Roach  Procedure(s) Performed: COLONOSCOPY (N/A )  Patient location during evaluation: Endoscopy Anesthesia Type: General Level of consciousness: awake and alert Pain management: pain level controlled Vital Signs Assessment: post-procedure vital signs reviewed and stable Respiratory status: spontaneous breathing and respiratory function stable Cardiovascular status: stable Anesthetic complications: no     Last Vitals:  Vitals:   08/02/17 1445 08/02/17 1504  BP: (!) 162/92 (!) 172/104  Pulse: 79 81  Resp: 19 (!) 24  Temp:  (!) 36.3 C  SpO2: 100% 100%    Last Pain:  Vitals:   08/02/17 1504  TempSrc: Oral  PainSc:                  KEPHART,WILLIAM K
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
## 2017-08-02 NOTE — Op Note (Signed)
[facility] Gastroenterology Patient Name: Jerry ChurnDennis Patton Procedure Date: 08/02/2017 1:03 PM MRN: [id number] Account #: [id number] Date of Birth: Jul 05, 1956 Admit Type: Inpatient Age: [age] Room: [facility] ENDO ROOM 4 Gender: Male Note Status: Finalized Procedure:            Colonoscopy Indications:          Iron deficiency anemia Providers:            Jerry Zee B. Maximino Greenlandahiliani MD, MD Referring MD:         Serita ShellerErnest B. Maryellen PileEason MD, MD (Referring MD) Medicines:            Monitored Anesthesia Care Complications:        No immediate complications. Procedure:            Pre-Anesthesia Assessment:                       - Prior to the procedure, a History and Physical was                        performed, and patient medications, allergies and                        sensitivities were reviewed. The patient's tolerance of                        previous anesthesia was reviewed.                       - The risks and benefits of the procedure and the                        sedation options and risks were discussed with the                        patient. All questions were answered and informed                        consent was obtained.                       After obtaining informed consent, the colonoscope was                        passed under direct vision. Throughout the procedure,                        the patient's blood pressure, pulse, and oxygen                        saturations were monitored continuously. The                        Colonoscope was introduced through the anus and                        advanced to the the cecum, identified by its                        appearance. The colonoscopy was performed with ease.  The patient tolerated the procedure well. The quality                        of the bowel preparation was poor. Findings:      The perianal and digital rectal examinations were normal.      A large amount of semi-solid stool was found  in the entire colon,       interfering with visualization. Lavage of the area was performed using a       large amount, resulting in incomplete clearance with continued poor       visualization. No old or new blood was present throughout the exam. No       large masses were seen but small lesions or polyps could have been       missed due to the poor prep. The cecumw as visualized at a distance but       not entered due to the copious amount of thick stool covering it that       could not be cleared.      The retroflexed view of the distal rectum and anal verge was normal and       showed no anal or rectal abnormalities. Impression:           - Preparation of the colon was poor.                       - Stool in the entire examined colon.                       - No large masses or old or new blood present.                       - This was not a sufficient exam for small lesions or                        polyps. Cecum unable to be examined due to poor prep.                       - No specimens collected. Recommendation:       - Return patient to hospital ward for ongoing care.                       - Resume previous diet.                       - Continue present medications.                       - Return to my office in 4 weeks. Procedure Code(s):    --- Professional ---                       [phone], Colonoscopy, flexible; diagnostic, including                        collection of specimen(s) by brushing or washing, when                        performed (separate procedure) Diagnosis Code(s):    --- Professional ---  D50.9, Iron deficiency anemia, unspecified CPT copyright 2016 American Medical Association. All rights reserved. The codes documented in this report are preliminary and upon coder review may  be revised to meet current compliance requirements.  Jerry Bouillon, MD Jerry Bickers B. Maximino Greenland MD, MD 08/02/2017 2:09:37 PM This report has been signed  electronically. Number of Addenda: 0 Note Initiated On: 08/02/2017 1:03 PM Scope Withdrawal Time: 0 hours 4 minutes 51 seconds  Total Procedure Duration: 0 hours 32 minutes 57 seconds       [employer]

## 2017-08-03 ENCOUNTER — Encounter: Payer: Self-pay | Admitting: Gastroenterology

## 2017-08-04 LAB — URINE CULTURE

## 2017-08-11 ENCOUNTER — Other Ambulatory Visit: Payer: Self-pay

## 2017-08-11 ENCOUNTER — Emergency Department: Payer: Medicare Other

## 2017-08-11 ENCOUNTER — Encounter: Payer: Self-pay | Admitting: Emergency Medicine

## 2017-08-11 ENCOUNTER — Emergency Department
Admission: EM | Admit: 2017-08-11 | Discharge: 2017-08-12 | Disposition: A | Discharge status: Home / Self Care | Payer: Medicare Other | Attending: Emergency Medicine | Admitting: Emergency Medicine

## 2017-08-11 DIAGNOSIS — J439 Emphysema, unspecified: Secondary | ICD-10-CM | POA: Diagnosis not present

## 2017-08-11 DIAGNOSIS — Z79899 Other long term (current) drug therapy: Secondary | ICD-10-CM | POA: Diagnosis not present

## 2017-08-11 DIAGNOSIS — I10 Essential (primary) hypertension: Secondary | ICD-10-CM | POA: Diagnosis not present

## 2017-08-11 DIAGNOSIS — Z87891 Personal history of nicotine dependence: Secondary | ICD-10-CM | POA: Insufficient documentation

## 2017-08-11 DIAGNOSIS — Z7982 Long term (current) use of aspirin: Secondary | ICD-10-CM | POA: Insufficient documentation

## 2017-08-11 DIAGNOSIS — K529 Noninfective gastroenteritis and colitis, unspecified: Secondary | ICD-10-CM | POA: Diagnosis not present

## 2017-08-11 DIAGNOSIS — R112 Nausea with vomiting, unspecified: Secondary | ICD-10-CM | POA: Diagnosis not present

## 2017-08-11 LAB — CBC
HCT: 30.4 % — ABNORMAL LOW (ref 40.0–52.0)
Hemoglobin: 9.2 g/dL — ABNORMAL LOW (ref 13.0–18.0)
MCH: 23 pg — ABNORMAL LOW (ref 26.0–34.0)
MCHC: 30.4 g/dL — ABNORMAL LOW (ref 32.0–36.0)
MCV: 75.8 fL — ABNORMAL LOW (ref 80.0–100.0)
PLATELETS: 204 10*3/uL (ref 150–440)
RBC: 4.02 MIL/uL — AB (ref 4.40–5.90)
RDW: 28.3 % — AB (ref 11.5–14.5)
WBC: 6 10*3/uL (ref 3.8–10.6)

## 2017-08-11 LAB — COMPREHENSIVE METABOLIC PANEL
ALT: 52 U/L (ref 17–63)
AST: 51 U/L — AB (ref 15–41)
Albumin: 3.1 g/dL — ABNORMAL LOW (ref 3.5–5.0)
Alkaline Phosphatase: 178 U/L — ABNORMAL HIGH (ref 38–126)
Anion gap: 6 (ref 5–15)
BILIRUBIN TOTAL: 0.4 mg/dL (ref 0.3–1.2)
BUN: 27 mg/dL — AB (ref 6–20)
CHLORIDE: 106 mmol/L (ref 101–111)
CO2: 26 mmol/L (ref 22–32)
CREATININE: 1.48 mg/dL — AB (ref 0.61–1.24)
Calcium: 8.5 mg/dL — ABNORMAL LOW (ref 8.9–10.3)
GFR, EST AFRICAN AMERICAN: 58 mL/min — AB (ref >60)
GFR, EST NON AFRICAN AMERICAN: 50 mL/min — AB (ref >60)
Glucose, Bld: 131 mg/dL — ABNORMAL HIGH (ref 65–99)
POTASSIUM: 3.4 mmol/L — AB (ref 3.5–5.1)
Sodium: 138 mmol/L (ref 135–145)
TOTAL PROTEIN: 7.1 g/dL (ref 6.5–8.1)

## 2017-08-11 LAB — LIPASE, BLOOD: LIPASE: 85 U/L — AB (ref 11–51)

## 2017-08-11 MED ORDER — ONDANSETRON 4 MG PO TBDP: 4.0000 mg | ORAL_TABLET | Freq: Three times a day (TID) | ORAL | 0 refills | Status: AC | PRN

## 2017-08-11 MED ORDER — SODIUM CHLORIDE 0.9 % IV SOLN
1000.0000 mL | Freq: Once | INTRAVENOUS | Status: AC
Start: 2017-08-11 — End: 2017-08-12
  Administered 2017-08-11: 1000 mL via INTRAVENOUS

## 2017-08-11 MED ORDER — ONDANSETRON HCL 4 MG/2ML IJ SOLN
4.0000 mg | Freq: Once | INTRAMUSCULAR | Status: AC
  Administered 2017-08-11: 4 mg via INTRAVENOUS
  Filled 2017-08-11: qty 2

## 2017-08-11 NOTE — ED Notes (Signed)
Attempted to call patient's spouse and daughter without answer.  Message left with spouse.

## 2017-08-11 NOTE — ED Provider Notes (Signed)
[facility] Emergency Department Provider Note   ____________________________________________    I have reviewed the triage vital signs and the nursing notes.   HISTORY  Chief Complaint Emesis     HPI [name] is a 61 y.o. male with a history of blindness seizures and hypertension who presents with complaints of nausea vomiting and diarrhea.  Patient reports symptoms started earlier today and he has had several episodes of nausea and vomiting with watery stools.  Does not think that he has had a fever.  He reports feeling better now.  He denies abdominal pain.  Has not taken anything for this.  He thinks it may be related to something he is eaten.  Does report a mild cough as well   Past Medical History:  Diagnosis Date  . Hypertension   . Seizures (HCC)   . Stroke [facility](HCC)     Patient Active Problem List   Diagnosis Date Noted  . Anemia 07/29/2017  . Seizure (HCC) 04/12/2017  . Hypertensive urgency 02/14/2017    Past Surgical History:  Procedure Laterality Date  . blind    . CLOSED REDUCTION CRANIOFACIAL SEPARATION    . COLONOSCOPY N/A 08/02/2017   Performed by Pasty Spillersahiliani, Varnita B, [facility]  . ESOPHAGOGASTRODUODENOSCOPY (EGD) WITH PROPOFOL N/A 07/31/2017   Performed by Pasty Spillersahiliani, Varnita B, [facility]  . LUNG REMOVAL, PARTIAL Right     Prior to Admission medications   Medication Sig Start Date End Date Taking? Authorizing Provider  aspirin EC 81 MG EC tablet Take 1 tablet (81 mg total) by mouth daily. 02/16/17   Auburn BilberryPatel, Shreyang, MD  atorvastatin (LIPITOR) 40 MG tablet Take 1 tablet (40 mg total) by mouth daily at 6 PM. Patient taking differently: Take 40 mg daily by mouth.  02/15/17   Auburn BilberryPatel, Shreyang, MD  cloNIDine (CATAPRES) 0.2 MG tablet Take 1 tablet (0.2 mg total) by mouth 2 (two) times daily. 02/15/17   Auburn BilberryPatel, Shreyang, MD  ferrous sulfate 325 (65 FE) MG tablet Take 1 tablet (325 mg total) 3 (three) times daily  with meals by mouth. 08/02/17   Shaune Pollackhen, Qing, MD  hydrALAZINE (APRESOLINE) 100 MG tablet Take 100 mg by mouth 3 (three) times daily. 04/19/17   [provider]  hydrochlorothiazide (HYDRODIURIL) 25 MG tablet Take 1 tablet (25 mg total) by mouth daily. 04/13/17   Enedina FinnerPatel, Sona, MD  hydrOXYzine (ATARAX/VISTARIL) 25 MG tablet Take 25 mg by mouth every 6 (six) hours as needed.    [provider]  ondansetron (ZOFRAN ODT) 4 MG disintegrating tablet Take 1 tablet (4 mg total) every 8 (eight) hours as needed by mouth for nausea or vomiting. 08/11/17   Jene EveryKinner, Zenith Kercheval, MD  pantoprazole (PROTONIX) 40 MG tablet Take 1 tablet (40 mg total) daily by mouth. 08/02/17   Shaune Pollackhen, Qing, MD  phenytoin (DILANTIN) 100 MG ER capsule Take 400 mg by mouth at bedtime.     [provider]  polyethylene glycol (MIRALAX / GLYCOLAX) packet Take 17 g by mouth daily. Patient taking differently: Take 17 g daily as needed by mouth.  02/15/17   Auburn BilberryPatel, Shreyang, MD     Allergies Iodine and Shellfish allergy  Family History  Problem Relation Age of Onset  . Hypertension Mother   . CAD Mother     Social History Social History   Tobacco Use  . Smoking status: Former Games developermoker  . Smokeless tobacco: Never Used  Substance Use Topics  . Alcohol use:  No  . Drug use: No    Review of Systems  Constitutional: No chills Eyes: Blind ENT: No sore throat. Cardiovascular: Denies chest pain. Respiratory: Denies shortness of breath. Gastrointestinal: As above Genitourinary: Negative for dysuria. Musculoskeletal: Negative for back pain. Skin: Negative for rash. Neurological: Negative for headaches    ____________________________________________   PHYSICAL EXAM:  VITAL SIGNS: ED Triage Vitals  Enc Vitals Group     BP 08/11/17 1923 (!) 163/105     Pulse Rate 08/11/17 1923 80     Resp 08/11/17 1923 (!) 24     Temp 08/11/17 1923 98.2 F (36.8 C)     Temp Source 08/11/17 1923 Oral     SpO2 08/11/17 1923  100 %     Weight 08/11/17 1924 77.1 kg (170 lb)     Height 08/11/17 1924 2.057 m (6\' 9" )     Head Circumference --      Peak Flow --      Pain Score 08/11/17 1922 0     Pain Loc --      Pain Edu? --      Excl. in GC? --     Constitutional: Alert and oriented. No acute distress.    Nose: No congestion/rhinnorhea. Mouth/Throat: Mucous membranes are moist.    Cardiovascular: Normal rate, regular rhythm.   Good peripheral circulation. Respiratory: Normal respiratory effort.  No retractions.  Scattered mild wheezes Gastrointestinal: Soft and nontender. No distention.  No CVA tenderness. Genitourinary: deferred Musculoskeletal:  Warm and well perfused Neurologic:  Normal speech and language. No gross focal neurologic deficits are appreciated.  Skin:  Skin is warm, dry and intact. No rash noted. Psychiatric: Mood and affect are normal. Speech and behavior are normal.  ____________________________________________   LABS (all labs ordered are listed, but only abnormal results are displayed)  Labs Reviewed  LIPASE, BLOOD - Abnormal; Notable for the following components:      Result Value   Lipase 85 (*)    All other components within normal limits  COMPREHENSIVE METABOLIC PANEL - Abnormal; Notable for the following components:   Potassium 3.4 (*)    Glucose, Bld 131 (*)    BUN 27 (*)    Creatinine, Ser 1.48 (*)    Calcium 8.5 (*)    Albumin 3.1 (*)    AST 51 (*)    Alkaline Phosphatase 178 (*)    GFR calc non Af Amer 50 (*)    GFR calc Af Amer 58 (*)    All other components within normal limits  CBC - Abnormal; Notable for the following components:   RBC 4.02 (*)    Hemoglobin 9.2 (*)    HCT 30.4 (*)    MCV 75.8 (*)    MCH 23.0 (*)    MCHC 30.4 (*)    RDW 28.3 (*)    All other components within normal limits  URINALYSIS, COMPLETE (UACMP) WITH MICROSCOPIC    ____________________________________________  EKG  None ____________________________________________  RADIOLOGY  Chest x-ray unremarkable ____________________________________________   PROCEDURES  Procedure(s) performed: No  Procedures   Critical Care performed:No ____________________________________________   INITIAL IMPRESSION / ASSESSMENT AND PLAN / ED COURSE  Pertinent labs & imaging results that were available during my care of the patient were reviewed by me and considered in my medical decision making (see chart for details).  Patient presents with nausea vomiting and diarrhea although reports he is feeling significant better at this time.  Recently in the hospital for symptom medic anemia,  hemoglobin is much better today.  Overall lab work is reassuring, BUN and creatinine consistent with dehydration.  Will treat with IV fluids and Zofran and reevaluate.  After treatment patient felt significantly better, he has no nausea has not had any more diarrhea.  He is impatient to go home.  I feel this is reasonable, return precautions discussed will discharge with Zofran    ____________________________________________   FINAL CLINICAL IMPRESSION(S) / ED DIAGNOSES  Final diagnoses:  Gastroenteritis        Note:  This document was prepared using Dragon voice recognition software and may include unintentional dictation errors.    Jene Every, MD 08/11/17 2256

## 2017-08-11 NOTE — ED Triage Notes (Signed)
Pt to ED via EMS from home c/o n/v since he woke up.  States vomited x1, denies diarrhea or abd pain.  EMS states had 2 pints blood transfusion on 08/02/17.  Hx of HTN and seizures.

## 2017-08-11 NOTE — ED Notes (Signed)
[phone] home number busy signal unable to leave VM

## 2017-08-11 NOTE — ED Notes (Signed)
[phone] pt's spouse left message no answer

## 2017-08-12 DIAGNOSIS — R112 Nausea with vomiting, unspecified: Secondary | ICD-10-CM | POA: Diagnosis not present

## 2017-08-12 NOTE — ED Notes (Signed)
Spoke with [name] daughter [name] [phone] informed pt is ready to go home reports she does not drive at night, reports she is going to call her mother Mrs. [name] to talk to her.

## 2017-08-12 NOTE — ED Notes (Signed)
[facility] called to transport home 

## 2017-08-12 NOTE — ED Notes (Signed)
EMS pick up pt to transport home tunable to sign e-pad not operating in room 1 at present

## 2017-08-12 NOTE — ED Notes (Signed)
Pt calm changed linen and provided with warm blanket pt incontinent of urine changed gown,

## 2017-08-12 NOTE — ED Notes (Signed)
[facility] [phone] pt's daughter called and reports she spoke with pt's spouse and report they can not come to pick up pt asked to arrange transportation for pt to come home via ambulance pt's daughter verbalizes understanding that transportation is not medically neccessary.

## 2017-08-12 NOTE — ED Notes (Signed)
Pt left with EMS transported home

## 2017-08-23 ENCOUNTER — Telehealth: Payer: Self-pay | Admitting: Gastroenterology

## 2017-08-23 NOTE — Telephone Encounter (Signed)
Attempted to make appt for a 4 week follow up with Dr. Maximino Greenlandahiliani. After patients daughter speaks to him she will call to make appt. No DPR on file

## 2017-08-24 ENCOUNTER — Telehealth: Payer: Self-pay | Admitting: Gastroenterology

## 2017-08-24 ENCOUNTER — Encounter: Payer: Self-pay | Admitting: Gastroenterology

## 2017-08-24 NOTE — Telephone Encounter (Signed)
Left voice message for patient to call and schedule a 4 week follow up with Dr. Tahiliani. Letter sent °

## 2017-08-30 ENCOUNTER — Telehealth: Payer: Self-pay

## 2017-08-30 NOTE — Telephone Encounter (Signed)
Called home phone for patient.   Someone answered, put me on hold, then came back and hung up the phone.   If they callback, patient is to have repeat labs: CBC, CMP, Ferritin, Iron, TIBC.

## 2017-09-05 ENCOUNTER — Emergency Department: Payer: Medicare Other

## 2017-09-05 ENCOUNTER — Emergency Department
Admission: EM | Admit: 2017-09-05 | Discharge: 2017-09-05 | Disposition: A | Discharge status: Home / Self Care | Payer: Medicare Other | Attending: Emergency Medicine | Admitting: Emergency Medicine

## 2017-09-05 DIAGNOSIS — Z79899 Other long term (current) drug therapy: Secondary | ICD-10-CM | POA: Insufficient documentation

## 2017-09-05 DIAGNOSIS — Z87891 Personal history of nicotine dependence: Secondary | ICD-10-CM | POA: Insufficient documentation

## 2017-09-05 DIAGNOSIS — Z8673 Personal history of transient ischemic attack (TIA), and cerebral infarction without residual deficits: Secondary | ICD-10-CM | POA: Diagnosis not present

## 2017-09-05 DIAGNOSIS — R0602 Shortness of breath: Secondary | ICD-10-CM | POA: Insufficient documentation

## 2017-09-05 DIAGNOSIS — I10 Essential (primary) hypertension: Secondary | ICD-10-CM | POA: Diagnosis not present

## 2017-09-05 DIAGNOSIS — Z7982 Long term (current) use of aspirin: Secondary | ICD-10-CM | POA: Insufficient documentation

## 2017-09-05 LAB — BASIC METABOLIC PANEL
Anion gap: 7 (ref 5–15)
BUN: 33 mg/dL — AB (ref 6–20)
CHLORIDE: 103 mmol/L (ref 101–111)
CO2: 27 mmol/L (ref 22–32)
CREATININE: 1.63 mg/dL — AB (ref 0.61–1.24)
Calcium: 9.3 mg/dL (ref 8.9–10.3)
GFR calc Af Amer: 51 mL/min — ABNORMAL LOW (ref >60)
GFR calc non Af Amer: 44 mL/min — ABNORMAL LOW (ref >60)
Glucose, Bld: 133 mg/dL — ABNORMAL HIGH (ref 65–99)
Potassium: 3.3 mmol/L — ABNORMAL LOW (ref 3.5–5.1)
Sodium: 137 mmol/L (ref 135–145)

## 2017-09-05 LAB — CBC WITH DIFFERENTIAL/PLATELET
BASOS PCT: 1 %
Basophils Absolute: 0 10*3/uL (ref 0–0.1)
EOS ABS: 0.5 10*3/uL (ref 0–0.7)
EOS PCT: 8 %
HCT: 37.8 % — ABNORMAL LOW (ref 40.0–52.0)
HEMOGLOBIN: 11.7 g/dL — AB (ref 13.0–18.0)
LYMPHS ABS: 1.5 10*3/uL (ref 1.0–3.6)
Lymphocytes Relative: 22 %
MCH: 24.8 pg — AB (ref 26.0–34.0)
MCHC: 30.9 g/dL — AB (ref 32.0–36.0)
MCV: 80.1 fL (ref 80.0–100.0)
MONO ABS: 0.5 10*3/uL (ref 0.2–1.0)
MONOS PCT: 7 %
Neutro Abs: 4.3 10*3/uL (ref 1.4–6.5)
Neutrophils Relative %: 64 %
PLATELETS: 198 10*3/uL (ref 150–440)
RBC: 4.73 MIL/uL (ref 4.40–5.90)
RDW: 24.9 % — AB (ref 11.5–14.5)
WBC: 6.9 10*3/uL (ref 3.8–10.6)

## 2017-09-05 LAB — PHENYTOIN LEVEL, TOTAL

## 2017-09-05 MED ORDER — PHENYTOIN SODIUM EXTENDED 100 MG PO CAPS
400.0000 mg | ORAL_CAPSULE | Freq: Once | ORAL | Status: AC
  Administered 2017-09-05: 400 mg via ORAL
  Filled 2017-09-05: qty 4

## 2017-09-05 MED ORDER — HYDRALAZINE HCL 50 MG PO TABS
100.0000 mg | ORAL_TABLET | Freq: Once | ORAL | Status: AC
  Administered 2017-09-05: 100 mg via ORAL
  Filled 2017-09-05: qty 2

## 2017-09-05 MED ORDER — HYDROXYZINE HCL 25 MG PO TABS: 25.0000 mg | ORAL_TABLET | Freq: Four times a day (QID) | ORAL | 0 refills | Status: AC | PRN

## 2017-09-05 NOTE — ED Triage Notes (Signed)
Pt to ED via [facility] from home. Pt family states they called EMS bc pt was SOB. Pt shows no sighs of distress and is stating 98% on RA. Pt required no 02 supplement in transit. Pt is NAD and awaiting EDP at this time.

## 2017-09-05 NOTE — Discharge Instructions (Signed)
Your chest xray and lab tests today were unremarkable.  Your Dilantin blood level was 0, which means you don't have enough of this medicine in your body to protect you from seizures. Be sure to take your dilantin every day as prescribed.  Take it tonight as well at bedtime.  Follow up with your doctor for continued monitoring of your symptoms.

## 2017-09-05 NOTE — ED Provider Notes (Signed)
[facility] Emergency Department Provider Note  ____________________________________________  Time seen: Approximately 6:41 PM  I have reviewed the triage vital signs and the nursing notes.   HISTORY  Chief Complaint Shortness of Breath  Level 5 Caveat: Portions of the History and Physical are unable to be obtained due to patient being a poor historian   HPI [name] is a 61 y.o. male brought to the ED due to an episode of shortness of breath earlier today which is now resolved. Denies any pain anywhere. No headache or falls or trauma. No cough or fever or chills. Eating and drinking normally. Has a history of hypertension, due for his midday hydralazine. No aggravating or alleviating factors, symptoms were mild to moderate in intensity, no other associated symptoms other than fatigue at the time.     Past Medical History:  Diagnosis Date  . Hypertension   . Seizures (HCC)   . Stroke [facility](HCC)      Patient Active Problem List   Diagnosis Date Noted  . Anemia 07/29/2017  . Seizure (HCC) 04/12/2017  . Hypertensive urgency 02/14/2017     Past Surgical History:  Procedure Laterality Date  . blind    . CLOSED REDUCTION CRANIOFACIAL SEPARATION    . COLONOSCOPY N/A 08/02/2017   Procedure: COLONOSCOPY;  Surgeon: Pasty Spillersahiliani, Varnita B, MD;  Location: [facility] ENDOSCOPY;  Service: Endoscopy;  Laterality: N/A;  . ESOPHAGOGASTRODUODENOSCOPY (EGD) WITH PROPOFOL N/A 07/31/2017   Procedure: ESOPHAGOGASTRODUODENOSCOPY (EGD) WITH PROPOFOL;  Surgeon: Pasty Spillersahiliani, Varnita B, MD;  Location: [facility] ENDOSCOPY;  Service: Endoscopy;  Laterality: N/A;  . LUNG REMOVAL, PARTIAL Right      Prior to Admission medications   Medication Sig Start Date End Date Taking? Authorizing Provider  aspirin EC 81 MG EC tablet Take 1 tablet (81 mg total) by mouth daily. 02/16/17   Auburn BilberryPatel, Shreyang, MD  atorvastatin (LIPITOR) 40 MG tablet Take 1 tablet (40 mg total) by mouth daily at 6  PM. Patient taking differently: Take 40 mg daily by mouth.  02/15/17   Auburn BilberryPatel, Shreyang, MD  cloNIDine (CATAPRES) 0.2 MG tablet Take 1 tablet (0.2 mg total) by mouth 2 (two) times daily. 02/15/17   Auburn BilberryPatel, Shreyang, MD  ferrous sulfate 325 (65 FE) MG tablet Take 1 tablet (325 mg total) 3 (three) times daily with meals by mouth. 08/02/17   Shaune Pollackhen, Qing, MD  hydrALAZINE (APRESOLINE) 100 MG tablet Take 100 mg by mouth 3 (three) times daily. 04/19/17   [provider]  hydrochlorothiazide (HYDRODIURIL) 25 MG tablet Take 1 tablet (25 mg total) by mouth daily. 04/13/17   Enedina FinnerPatel, Sona, MD  hydrOXYzine (ATARAX/VISTARIL) 25 MG tablet Take 25 mg by mouth every 6 (six) hours as needed.    [provider]  ondansetron (ZOFRAN ODT) 4 MG disintegrating tablet Take 1 tablet (4 mg total) every 8 (eight) hours as needed by mouth for nausea or vomiting. 08/11/17   Jene EveryKinner, Robert, MD  pantoprazole (PROTONIX) 40 MG tablet Take 1 tablet (40 mg total) daily by mouth. 08/02/17   Shaune Pollackhen, Qing, MD  phenytoin (DILANTIN) 100 MG ER capsule Take 400 mg by mouth at bedtime.     [provider]  polyethylene glycol (MIRALAX / GLYCOLAX) packet Take 17 g by mouth daily. Patient taking differently: Take 17 g daily as needed by mouth.  02/15/17   Auburn BilberryPatel, Shreyang, MD     Allergies Iodine and Shellfish allergy   Family History  Problem Relation Age of Onset  . Hypertension Mother   .  CAD Mother     Social History Social History   Tobacco Use  . Smoking status: Former Games developermoker  . Smokeless tobacco: Never Used  Substance Use Topics  . Alcohol use: No  . Drug use: No    Review of Systems  Constitutional:   No fever or chills.  ENT:   No sore throat. No rhinorrhea. Cardiovascular:   No chest pain or syncope. Respiratory:   Positive as above shortness of breath without cough. Gastrointestinal:   Negative for abdominal pain, vomiting and diarrhea.  Musculoskeletal:   Negative for focal pain or  swelling All other systems reviewed and are negative except as documented above in ROS and HPI.  ____________________________________________   PHYSICAL EXAM:  VITAL SIGNS: ED Triage Vitals [09/05/17 1437]  Enc Vitals Group     BP (!) 152/67     Pulse Rate (!) 102     Resp 20     Temp 98.4 F (36.9 C)     Temp Source Oral     SpO2 98 %     Weight 171 lb (77.6 kg)     Height 6\' 9"  (2.057 m)     Head Circumference      Peak Flow      Pain Score      Pain Loc      Pain Edu?      Excl. in GC?     Vital signs reviewed, nursing assessments reviewed.   Constitutional:   Alert and oriented. Well appearing and in no distress. Eyes:   No scleral icterus.  EOMI. scarred corneas bilaterally. ENT   Head:   Normocephalic and atraumatic.   Nose:   No congestion/rhinnorhea.    Mouth/Throat:   MMM, no pharyngeal erythema. No peritonsillar mass.    Neck:   No meningismus. Full ROM. Hematological/Lymphatic/Immunilogical:   No cervical lymphadenopathy. Cardiovascular:   RRR. Symmetric bilateral radial and DP pulses.  No murmurs.  Respiratory:   Normal respiratory effort without tachypnea/retractions. Breath sounds are clear and equal bilaterally. No wheezes/rales/rhonchi. Gastrointestinal:   Soft and nontender. Non distended. There is no CVA tenderness.  No rebound, rigidity, or guarding. Genitourinary:   deferred Musculoskeletal:   Normal range of motion in all extremities. No joint effusions.  No lower extremity tenderness.  No edema. Neurologic:   Baseline speech and language.  Motor grossly intact. No gross focal neurologic deficits are appreciated.  Skin:    Skin is warm, dry and intact. No rash noted.  No petechiae, purpura, or bullae.  ____________________________________________    LABS (pertinent positives/negatives) (all labs ordered are listed, but only abnormal results are displayed) Labs Reviewed  BASIC METABOLIC PANEL - Abnormal; Notable for the following  components:      Result Value   Potassium 3.3 (*)    Glucose, Bld 133 (*)    BUN 33 (*)    Creatinine, Ser 1.63 (*)    GFR calc non Af Amer 44 (*)    GFR calc Af Amer 51 (*)    All other components within normal limits  CBC WITH DIFFERENTIAL/PLATELET - Abnormal; Notable for the following components:   Hemoglobin 11.7 (*)    HCT 37.8 (*)    MCH 24.8 (*)    MCHC 30.9 (*)    RDW 24.9 (*)    All other components within normal limits  PHENYTOIN LEVEL, TOTAL - Abnormal; Notable for the following components:   Phenytoin Lvl <2.5 (*)    All other components within normal  limits   ____________________________________________   EKG    ____________________________________________    RADIOLOGY  Dg Chest 2 View  Result Date: 09/05/2017 CLINICAL DATA:  Shortness of Breath EXAM: CHEST  2 VIEW COMPARISON:  08/11/2017 FINDINGS: Cardiac shadow is stable. Postoperative changes in the right lung are seen and stable. Chronic emphysematous changes are noted on the left. No focal infiltrate is seen. A moderate-sized hiatal hernia is noted. No acute bony abnormality is seen. IMPRESSION: Chronic changes without acute abnormality. Electronically Signed   By: Alcide Clever M.D.   On: 09/05/2017 15:38    ____________________________________________   PROCEDURES Procedures  ____________________________________________     CLINICAL IMPRESSION / ASSESSMENT AND PLAN / ED COURSE  Pertinent labs & imaging results that were available during my care of the patient were reviewed by me and considered in my medical decision making (see chart for details).   Patient presents with a complaint of shortness of breath which is now resolved. He also been more tired than usual earlier today, but now is back to baseline interactions in energy level according to family. He is in good spirits, laughing about his brother that has gout. Chest x-ray unremarkable, labs unremarkable except for a phenytoin level of  0. Confirmed his prescription, he takes phenytoin 400 mg every night. I'll give him an additional 800 mg here and then have him take 400 mg at home tonight. I emphasized to the family that is important he be seen in follow-up within 1 week for recheck of his phenytoin level and possible adjustment of his dosage. He has seen Dr. Maryellen Pile in the past and they'll call the clinic for follow-up. Low suspicion of ACS PE dissection and stroke intracranial hemorrhage, no evidence of infection or sepsis.  With the nontherapeutic phenytoin level, patient may have had a brief seizure and has now returned to baseline.     ____________________________________________   FINAL CLINICAL IMPRESSION(S) / ED DIAGNOSES    Final diagnoses:  Shortness of breath      This SmartLink is deprecated. Use AVSMEDLIST instead to display the medication list for a patient.   Portions of this note were generated with dragon dictation software. Dictation errors may occur despite best attempts at proofreading.    Sharman Cheek, MD 09/05/17 [phone]

## 2017-09-05 NOTE — ED Notes (Signed)
Pt is discharged home with family. Pt daughter understands the changes in medicine the EDP has made and understands the new prescription. PT is blind so daughter signed the ed discharge as she is the one to whom the papers where given.   Upon rounds RN noticed family was not at bedside. RN attempted to locate family. CNA [name]  And CNA [name] assisted Jerry CooterHenry RN in getting pt dressed and in wheelchair for d/c. Pt family unable to be located Jerry RiegerLaura CNA went to waiting room to see if ride was here. Jerry RiegerLaura CNA was made aware that a [name] had been sitting in front door that could possible be the ride. Jerry RiegerLaura CNA walked out confirmed it was the ride. This RN and Jerry CornfieldStephanie CNA, Jerry RiegerLaura CNA packed up pt and pt family belongings and placed them in family members car to be d/c with pt. Pt family came back to room and informed their belongings and pt were outside being d/c. Pt family upset their belongings where moved. This RN made charge aware.

## 2017-09-09 ENCOUNTER — Encounter: Payer: Self-pay | Admitting: Emergency Medicine

## 2017-09-09 ENCOUNTER — Emergency Department
Admission: EM | Admit: 2017-09-09 | Discharge: 2017-09-10 | Disposition: A | Discharge status: Home / Self Care | Payer: Medicare Other | Attending: Emergency Medicine | Admitting: Emergency Medicine

## 2017-09-09 ENCOUNTER — Other Ambulatory Visit: Payer: Self-pay

## 2017-09-09 DIAGNOSIS — Z7982 Long term (current) use of aspirin: Secondary | ICD-10-CM | POA: Diagnosis not present

## 2017-09-09 DIAGNOSIS — I10 Essential (primary) hypertension: Secondary | ICD-10-CM | POA: Insufficient documentation

## 2017-09-09 DIAGNOSIS — Z87891 Personal history of nicotine dependence: Secondary | ICD-10-CM | POA: Insufficient documentation

## 2017-09-09 DIAGNOSIS — R569 Unspecified convulsions: Secondary | ICD-10-CM | POA: Insufficient documentation

## 2017-09-09 DIAGNOSIS — Z79899 Other long term (current) drug therapy: Secondary | ICD-10-CM | POA: Diagnosis not present

## 2017-09-09 LAB — BASIC METABOLIC PANEL
ANION GAP: 8 (ref 5–15)
BUN: 24 mg/dL — ABNORMAL HIGH (ref 6–20)
CO2: 29 mmol/L (ref 22–32)
Calcium: 9.6 mg/dL (ref 8.9–10.3)
Chloride: 104 mmol/L (ref 101–111)
Creatinine, Ser: 1.35 mg/dL — ABNORMAL HIGH (ref 0.61–1.24)
GFR calc Af Amer: >60 mL/min (ref >60)
GFR calc non Af Amer: 56 mL/min — ABNORMAL LOW (ref >60)
GLUCOSE: 118 mg/dL — AB (ref 65–99)
POTASSIUM: 3.6 mmol/L (ref 3.5–5.1)
Sodium: 141 mmol/L (ref 135–145)

## 2017-09-09 LAB — CBC
HEMATOCRIT: 42.7 % (ref 40.0–52.0)
HEMOGLOBIN: 13.1 g/dL (ref 13.0–18.0)
MCH: 25 pg — AB (ref 26.0–34.0)
MCHC: 30.7 g/dL — AB (ref 32.0–36.0)
MCV: 81.5 fL (ref 80.0–100.0)
Platelets: 179 10*3/uL (ref 150–440)
RBC: 5.24 MIL/uL (ref 4.40–5.90)
RDW: 24.8 % — ABNORMAL HIGH (ref 11.5–14.5)
WBC: 6.2 10*3/uL (ref 3.8–10.6)

## 2017-09-09 LAB — PHENYTOIN LEVEL, TOTAL: Phenytoin Lvl: 25.4 ug/mL — ABNORMAL HIGH (ref 10.0–20.0)

## 2017-09-09 NOTE — ED Provider Notes (Signed)
[facility] Emergency Department Provider Note   ____________________________________________   First MD Initiated Contact with Patient 09/09/17 2155     (approximate)  I have reviewed the triage vital signs and the nursing notes.   HISTORY  Chief Complaint Seizures    HPI [name] is a 61 y.o. male Who was noticed by his wife to have had a seizure his eyelids were flickering his head rolled back and he had atypical typical seizure. Patient has not had a seizure for quite some time. He only had one today. He has a history of having a stroke and hypertension cholesterol. He is back to normal at the present time   Past Medical History:  Diagnosis Date  . Hypertension   . Seizures (HCC)   . Stroke [facility](HCC)     Patient Active Problem List   Diagnosis Date Noted  . Anemia 07/29/2017  . Seizure (HCC) 04/12/2017  . Hypertensive urgency 02/14/2017    Past Surgical History:  Procedure Laterality Date  . blind    . CLOSED REDUCTION CRANIOFACIAL SEPARATION    . COLONOSCOPY N/A 08/02/2017   Procedure: COLONOSCOPY;  Surgeon: Pasty Spillersahiliani, Varnita B, MD;  Location: [facility] ENDOSCOPY;  Service: Endoscopy;  Laterality: N/A;  . ESOPHAGOGASTRODUODENOSCOPY (EGD) WITH PROPOFOL N/A 07/31/2017   Procedure: ESOPHAGOGASTRODUODENOSCOPY (EGD) WITH PROPOFOL;  Surgeon: Pasty Spillersahiliani, Varnita B, MD;  Location: [facility] ENDOSCOPY;  Service: Endoscopy;  Laterality: N/A;  . LUNG REMOVAL, PARTIAL Right     Prior to Admission medications   Medication Sig Start Date End Date Taking? Authorizing Provider  aspirin EC 81 MG EC tablet Take 1 tablet (81 mg total) by mouth daily. 02/16/17   Auburn BilberryPatel, Shreyang, MD  atorvastatin (LIPITOR) 40 MG tablet Take 1 tablet (40 mg total) by mouth daily at 6 PM. Patient taking differently: Take 40 mg daily by mouth.  02/15/17   Auburn BilberryPatel, Shreyang, MD  cloNIDine (CATAPRES) 0.2 MG tablet Take 1 tablet (0.2 mg total) by mouth 2 (two) times daily. 02/15/17   Auburn BilberryPatel,  Shreyang, MD  ferrous sulfate 325 (65 FE) MG tablet Take 1 tablet (325 mg total) 3 (three) times daily with meals by mouth. 08/02/17   Shaune Pollackhen, Qing, MD  hydrALAZINE (APRESOLINE) 100 MG tablet Take 100 mg by mouth 3 (three) times daily. 04/19/17   [provider]  hydrochlorothiazide (HYDRODIURIL) 25 MG tablet Take 1 tablet (25 mg total) by mouth daily. 04/13/17   Enedina FinnerPatel, Sona, MD  hydrOXYzine (ATARAX/VISTARIL) 25 MG tablet Take 1 tablet (25 mg total) by mouth every 6 (six) hours as needed for anxiety. 09/05/17   Sharman CheekStafford, Phillip, MD  ondansetron (ZOFRAN ODT) 4 MG disintegrating tablet Take 1 tablet (4 mg total) every 8 (eight) hours as needed by mouth for nausea or vomiting. 08/11/17   Jene EveryKinner, Robert, MD  pantoprazole (PROTONIX) 40 MG tablet Take 1 tablet (40 mg total) daily by mouth. 08/02/17   Shaune Pollackhen, Qing, MD  phenytoin (DILANTIN) 100 MG ER capsule Take 400 mg by mouth at bedtime.     [provider]  polyethylene glycol (MIRALAX / GLYCOLAX) packet Take 17 g by mouth daily. Patient taking differently: Take 17 g daily as needed by mouth.  02/15/17   Auburn BilberryPatel, Shreyang, MD    Allergies Iodine and Shellfish allergy  Family History  Problem Relation Age of Onset  . Hypertension Mother   . CAD Mother     Social History Social History   Tobacco Use  . Smoking status: Former Games developermoker  . Smokeless  tobacco: Never Used  Substance Use Topics  . Alcohol use: No  . Drug use: No    Review of Systems  Constitutional: No fever/chills Eyes: No visual changes. ENT: No sore throat. Cardiovascular: Denies chest pain. Respiratory: Denies shortness of breath. Gastrointestinal: No abdominal pain.  No nausea, no vomiting.  No diarrhea.  No constipation. Genitourinary: Negative for dysuria. Musculoskeletal: Negative for back pain. Skin: Negative for rash. Neurological: Negative for headaches,  New focal weakness   ____________________________________________   PHYSICAL EXAM:  VITAL  SIGNS: ED Triage Vitals  Enc Vitals Group     BP 09/09/17 2154 (!) 159/99     Pulse Rate 09/09/17 2154 83     Resp 09/09/17 2154 12     Temp 09/09/17 2154 98.3 F (36.8 C)     Temp Source 09/09/17 2154 Oral     SpO2 09/09/17 2154 99 %     Weight 09/09/17 2155 171 lb (77.6 kg)     Height 09/09/17 2155 6\' 9"  (2.057 m)     Head Circumference --      Peak Flow --      Pain Score 09/09/17 2149 0     Pain Loc --      Pain Edu? --      Excl. in GC? --     Constitutional: Alert and oriented. Well appearing and in no acute distress. Eyes: Conjunctivae are normal.patient has bilateral opaque cornea. Head: Atraumatic. Nose: No congestion/rhinnorhea. Mouth/Throat: Mucous membranes are moist.  Oropharynx non-erythematous. Neck: No stridor.   Cardiovascular: Normal rate, regular rhythm. Grossly normal heart sounds.  Good peripheral circulation. Respiratory: Normal respiratory effort.  No retractions. Lungs CTAB. Gastrointestinal: Soft and nontender. No distention. No abdominal bruits. No CVA tenderness. Musculoskeletal: No lower extremity tenderness nor edema.  No joint effusions. Neurologic:  no change in patient's usual speech and language. No new gross focal neurologic deficits are appreciated.  Skin:  Skin is warm, dry and intact. No rash noted.   ____________________________________________   LABS (all labs ordered are listed, but only abnormal results are displayed)  Labs Reviewed  PHENYTOIN LEVEL, TOTAL - Abnormal; Notable for the following components:      Result Value   Phenytoin Lvl 25.4 (*)    All other components within normal limits  BASIC METABOLIC PANEL - Abnormal; Notable for the following components:   Glucose, Bld 118 (*)    BUN 24 (*)    Creatinine, Ser 1.35 (*)    GFR calc non Af Amer 56 (*)    All other components within normal limits  CBC - Abnormal; Notable for the following components:   MCH 25.0 (*)    MCHC 30.7 (*)    RDW 24.8 (*)    All other  components within normal limits  CBG MONITORING, ED   ____________________________________________  EKG   ____________________________________________  RADIOLOGY   ____________________________________________   PROCEDURES  Procedure(s) performed:   Procedures  Critical Care performed:   ____________________________________________   INITIAL IMPRESSION / ASSESSMENT AND PLAN / ED COURSE       ____________________________________________   FINAL CLINICAL IMPRESSION(S) / ED DIAGNOSES  Final diagnoses:  Seizure [facility](HCC)     ED Discharge Orders    None       Note:  This document was prepared using Dragon voice recognition software and may include unintentional dictation errors.    Arnaldo NatalMalinda, Paul F, MD 09/09/17 [phone]

## 2017-09-09 NOTE — Discharge Instructions (Signed)
please work on finding a another doctor to monitor him since Dr. Maryellen PileEason has retired. Also please call either Dr. Malvin JohnsPotter or Dr. Sherryll BurgerShah the neurologists to arrange a follow-up visit with them. His Dilantin level is slightly high at the present. I will leave his dosage as it is however. Please return here for any further seizures or any other problems that he may encounter.

## 2017-09-09 NOTE — ED Triage Notes (Signed)
Pt has hx of seizures and had a seizure today consisting eyes flickering. Pt is not postictal at this time.

## 2017-09-13 ENCOUNTER — Observation Stay
Admission: EM | Admit: 2017-09-13 | Discharge: 2017-09-15 | Disposition: A | Discharge status: Home / Self Care | Payer: Medicare Other | Attending: Internal Medicine | Admitting: Internal Medicine

## 2017-09-13 ENCOUNTER — Encounter: Payer: Self-pay | Admitting: Emergency Medicine

## 2017-09-13 ENCOUNTER — Other Ambulatory Visit: Payer: Self-pay

## 2017-09-13 DIAGNOSIS — Z8673 Personal history of transient ischemic attack (TIA), and cerebral infarction without residual deficits: Secondary | ICD-10-CM | POA: Insufficient documentation

## 2017-09-13 DIAGNOSIS — E876 Hypokalemia: Secondary | ICD-10-CM | POA: Insufficient documentation

## 2017-09-13 DIAGNOSIS — Z87891 Personal history of nicotine dependence: Secondary | ICD-10-CM | POA: Insufficient documentation

## 2017-09-13 DIAGNOSIS — I10 Essential (primary) hypertension: Secondary | ICD-10-CM | POA: Insufficient documentation

## 2017-09-13 DIAGNOSIS — Z7401 Bed confinement status: Secondary | ICD-10-CM | POA: Diagnosis not present

## 2017-09-13 DIAGNOSIS — Z7982 Long term (current) use of aspirin: Secondary | ICD-10-CM | POA: Diagnosis not present

## 2017-09-13 DIAGNOSIS — G251 Drug-induced tremor: Secondary | ICD-10-CM | POA: Diagnosis not present

## 2017-09-13 DIAGNOSIS — R251 Tremor, unspecified: Secondary | ICD-10-CM | POA: Diagnosis not present

## 2017-09-13 DIAGNOSIS — Z79899 Other long term (current) drug therapy: Secondary | ICD-10-CM | POA: Insufficient documentation

## 2017-09-13 DIAGNOSIS — N179 Acute kidney failure, unspecified: Secondary | ICD-10-CM | POA: Diagnosis not present

## 2017-09-13 DIAGNOSIS — K219 Gastro-esophageal reflux disease without esophagitis: Secondary | ICD-10-CM | POA: Diagnosis not present

## 2017-09-13 DIAGNOSIS — G40909 Epilepsy, unspecified, not intractable, without status epilepticus: Secondary | ICD-10-CM | POA: Diagnosis not present

## 2017-09-13 DIAGNOSIS — H547 Unspecified visual loss: Secondary | ICD-10-CM | POA: Insufficient documentation

## 2017-09-13 DIAGNOSIS — T420X1A Poisoning by hydantoin derivatives, accidental (unintentional), initial encounter: Secondary | ICD-10-CM | POA: Diagnosis not present

## 2017-09-13 DIAGNOSIS — R451 Restlessness and agitation: Secondary | ICD-10-CM | POA: Diagnosis not present

## 2017-09-13 LAB — BASIC METABOLIC PANEL
ANION GAP: 8 (ref 5–15)
BUN: 35 mg/dL — ABNORMAL HIGH (ref 6–20)
CO2: 29 mmol/L (ref 22–32)
Calcium: 9.3 mg/dL (ref 8.9–10.3)
Chloride: 105 mmol/L (ref 101–111)
Creatinine, Ser: 1.53 mg/dL — ABNORMAL HIGH (ref 0.61–1.24)
GFR calc Af Amer: 55 mL/min — ABNORMAL LOW (ref >60)
GFR, EST NON AFRICAN AMERICAN: 48 mL/min — AB (ref >60)
GLUCOSE: 138 mg/dL — AB (ref 65–99)
POTASSIUM: 3.4 mmol/L — AB (ref 3.5–5.1)
Sodium: 142 mmol/L (ref 135–145)

## 2017-09-13 LAB — CBC WITH DIFFERENTIAL/PLATELET
BASOS ABS: 0.1 10*3/uL (ref 0–0.1)
Basophils Relative: 1 %
Eosinophils Absolute: 0.4 10*3/uL (ref 0–0.7)
Eosinophils Relative: 4 %
HEMATOCRIT: 41.6 % (ref 40.0–52.0)
Hemoglobin: 13.1 g/dL (ref 13.0–18.0)
LYMPHS PCT: 15 %
Lymphs Abs: 1.5 10*3/uL (ref 1.0–3.6)
MCH: 26 pg (ref 26.0–34.0)
MCHC: 31.4 g/dL — ABNORMAL LOW (ref 32.0–36.0)
MCV: 82.9 fL (ref 80.0–100.0)
Monocytes Absolute: 0.7 10*3/uL (ref 0.2–1.0)
Monocytes Relative: 8 %
NEUTROS ABS: 7 10*3/uL — AB (ref 1.4–6.5)
NEUTROS PCT: 72 %
Platelets: 209 10*3/uL (ref 150–440)
RBC: 5.02 MIL/uL (ref 4.40–5.90)
RDW: 24 % — ABNORMAL HIGH (ref 11.5–14.5)
WBC: 9.6 10*3/uL (ref 3.8–10.6)

## 2017-09-13 LAB — PHENYTOIN LEVEL, TOTAL: Phenytoin Lvl: 40.4 ug/mL (ref 10.0–20.0)

## 2017-09-13 MED ORDER — POLYETHYLENE GLYCOL 3350 17 G PO PACK: 17.0000 g | PACK | Freq: Every day | ORAL | Status: DC | PRN

## 2017-09-13 MED ORDER — ASPIRIN EC 81 MG PO TBEC
81.0000 mg | DELAYED_RELEASE_TABLET | Freq: Every day | ORAL | Status: DC
  Administered 2017-09-14 – 2017-09-15 (×2): 81 mg via ORAL
  Filled 2017-09-13 (×3): qty 1

## 2017-09-13 MED ORDER — POTASSIUM CHLORIDE CRYS ER 20 MEQ PO TBCR
40.0000 meq | EXTENDED_RELEASE_TABLET | ORAL | Status: DC
  Administered 2017-09-14: 40 meq via ORAL
  Filled 2017-09-13: qty 2

## 2017-09-13 MED ORDER — METOPROLOL TARTRATE 5 MG/5ML IV SOLN
5.0000 mg | INTRAVENOUS | Status: DC | PRN
  Administered 2017-09-13: 5 mg via INTRAVENOUS

## 2017-09-13 MED ORDER — LORAZEPAM 2 MG/ML IJ SOLN
1.0000 mg | Freq: Once | INTRAMUSCULAR | Status: AC
  Administered 2017-09-13: 1 mg via INTRAVENOUS
  Filled 2017-09-13: qty 1

## 2017-09-13 MED ORDER — METOPROLOL TARTRATE 5 MG/5ML IV SOLN
INTRAVENOUS | Status: AC
  Filled 2017-09-13: qty 5

## 2017-09-13 MED ORDER — HYDROXYZINE HCL 25 MG PO TABS
25.0000 mg | ORAL_TABLET | Freq: Four times a day (QID) | ORAL | Status: DC | PRN
  Administered 2017-09-15: 25 mg via ORAL
  Filled 2017-09-13: qty 1

## 2017-09-13 MED ORDER — HYDRALAZINE HCL 50 MG PO TABS
100.0000 mg | ORAL_TABLET | Freq: Three times a day (TID) | ORAL | Status: DC
  Administered 2017-09-13 – 2017-09-15 (×5): 100 mg via ORAL
  Filled 2017-09-13 (×5): qty 2

## 2017-09-13 MED ORDER — ENOXAPARIN SODIUM 40 MG/0.4ML ~~LOC~~ SOLN
40.0000 mg | SUBCUTANEOUS | Status: DC
  Administered 2017-09-14: 40 mg via SUBCUTANEOUS
  Filled 2017-09-13 (×2): qty 0.4

## 2017-09-13 MED ORDER — CLONIDINE HCL 0.1 MG PO TABS
0.2000 mg | ORAL_TABLET | Freq: Two times a day (BID) | ORAL | Status: DC
  Administered 2017-09-13 – 2017-09-15 (×4): 0.2 mg via ORAL
  Filled 2017-09-13 (×4): qty 2

## 2017-09-13 MED ORDER — ONDANSETRON 4 MG PO TBDP: 4.0000 mg | ORAL_TABLET | Freq: Three times a day (TID) | ORAL | Status: DC | PRN

## 2017-09-13 MED ORDER — PANTOPRAZOLE SODIUM 40 MG PO TBEC
40.0000 mg | DELAYED_RELEASE_TABLET | Freq: Every day | ORAL | Status: DC
  Administered 2017-09-14 – 2017-09-15 (×2): 40 mg via ORAL
  Filled 2017-09-13 (×2): qty 1

## 2017-09-13 MED ORDER — ATORVASTATIN CALCIUM 20 MG PO TABS
40.0000 mg | ORAL_TABLET | Freq: Every day | ORAL | Status: DC
  Administered 2017-09-14: 40 mg via ORAL
  Filled 2017-09-13: qty 2

## 2017-09-13 MED ORDER — SODIUM CHLORIDE 0.9 % IV SOLN
INTRAVENOUS | Status: AC
  Administered 2017-09-13 – 2017-09-14 (×2): via INTRAVENOUS

## 2017-09-13 MED ORDER — FERROUS SULFATE 325 (65 FE) MG PO TABS
325.0000 mg | ORAL_TABLET | Freq: Three times a day (TID) | ORAL | Status: DC
  Administered 2017-09-14 – 2017-09-15 (×4): 325 mg via ORAL
  Filled 2017-09-13 (×4): qty 1

## 2017-09-13 NOTE — ED Notes (Signed)
Re[prt called to erica rn floor nurse

## 2017-09-13 NOTE — ED Triage Notes (Signed)
Patient presents to ED via [facility] from home. Family called 911 because patient was shaking. Per EMS family reports the patient will sometimes shake but today the shaking was more frequent. 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

## 2017-09-13 NOTE — ED Notes (Signed)
primedoc in with pt 

## 2017-09-13 NOTE — ED Provider Notes (Signed)
[facility] Emergency Department Provider Note   ____________________________________________   I have reviewed the triage vital signs and the nursing notes.   HISTORY  Chief Complaint Shaking   History limited by and level 5 caveat due to medical condition.   HPI [name] is a 61 y.o. male who presents to the emergency department today because of family concern for shaking per report.  DURATION:brief QUALITY: shaking CONTEXT: patient with history of seizure disorder. Was apparently at home today when family noticed shaking. Sounds like he does have tremors from time to time but today it was worse. MODIFYING FACTORS: none ASSOCIATED SYMPTOMS: agitation. Change in behavior  Per medical record review patient has a history of seizures, stroke.   Past Medical History:  Diagnosis Date  . Hypertension   . Seizures (HCC)   . Stroke [facility](HCC)     Patient Active Problem List   Diagnosis Date Noted  . Anemia 07/29/2017  . Seizure (HCC) 04/12/2017  . Hypertensive urgency 02/14/2017    Past Surgical History:  Procedure Laterality Date  . blind    . CLOSED REDUCTION CRANIOFACIAL SEPARATION    . COLONOSCOPY N/A 08/02/2017   Procedure: COLONOSCOPY;  Surgeon: Pasty Spillersahiliani, Varnita B, MD;  Location: [facility] ENDOSCOPY;  Service: Endoscopy;  Laterality: N/A;  . ESOPHAGOGASTRODUODENOSCOPY (EGD) WITH PROPOFOL N/A 07/31/2017   Procedure: ESOPHAGOGASTRODUODENOSCOPY (EGD) WITH PROPOFOL;  Surgeon: Pasty Spillersahiliani, Varnita B, MD;  Location: [facility] ENDOSCOPY;  Service: Endoscopy;  Laterality: N/A;  . LUNG REMOVAL, PARTIAL Right     Prior to Admission medications   Medication Sig Start Date End Date Taking? Authorizing Provider  aspirin EC 81 MG EC tablet Take 1 tablet (81 mg total) by mouth daily. 02/16/17   Auburn BilberryPatel, Shreyang, MD  atorvastatin (LIPITOR) 40 MG tablet Take 1 tablet (40 mg total) by mouth daily at 6 PM. Patient taking differently: Take 40 mg daily by mouth.  02/15/17    Auburn BilberryPatel, Shreyang, MD  cloNIDine (CATAPRES) 0.2 MG tablet Take 1 tablet (0.2 mg total) by mouth 2 (two) times daily. 02/15/17   Auburn BilberryPatel, Shreyang, MD  ferrous sulfate 325 (65 FE) MG tablet Take 1 tablet (325 mg total) 3 (three) times daily with meals by mouth. 08/02/17   Shaune Pollackhen, Qing, MD  hydrALAZINE (APRESOLINE) 100 MG tablet Take 100 mg by mouth 3 (three) times daily. 04/19/17   [provider]  hydrochlorothiazide (HYDRODIURIL) 25 MG tablet Take 1 tablet (25 mg total) by mouth daily. 04/13/17   Enedina FinnerPatel, Sona, MD  hydrOXYzine (ATARAX/VISTARIL) 25 MG tablet Take 1 tablet (25 mg total) by mouth every 6 (six) hours as needed for anxiety. 09/05/17   Sharman CheekStafford, Phillip, MD  ondansetron (ZOFRAN ODT) 4 MG disintegrating tablet Take 1 tablet (4 mg total) every 8 (eight) hours as needed by mouth for nausea or vomiting. 08/11/17   Jene EveryKinner, Robert, MD  pantoprazole (PROTONIX) 40 MG tablet Take 1 tablet (40 mg total) daily by mouth. 08/02/17   Shaune Pollackhen, Qing, MD  phenytoin (DILANTIN) 100 MG ER capsule Take 400 mg by mouth at bedtime.     [provider]  polyethylene glycol (MIRALAX / GLYCOLAX) packet Take 17 g by mouth daily. Patient taking differently: Take 17 g daily as needed by mouth.  02/15/17   Auburn BilberryPatel, Shreyang, MD    Allergies Iodine and Shellfish allergy  Family History  Problem Relation Age of Onset  . Hypertension Mother   . CAD Mother     Social History Social History   Tobacco Use  .  Smoking status: Former Games developermoker  . Smokeless tobacco: Never Used  Substance Use Topics  . Alcohol use: No  . Drug use: No    Review of Systems Constitutional: No fever/chills Eyes: No visual changes. ENT: No sore throat. Cardiovascular: Denies chest pain. Respiratory: Denies shortness of breath. Gastrointestinal: No abdominal pain.  No nausea, no vomiting.  No diarrhea.   Genitourinary: Negative for dysuria. Musculoskeletal: Negative for back pain. Skin: Negative for rash. Neurological: Positive  for tremors.  ____________________________________________   PHYSICAL EXAM:  VITAL SIGNS: ED Triage Vitals [09/13/17 1318]  Enc Vitals Group     BP (!) 147/99     Pulse Rate 84     Resp 15     Temp 97.6 F (36.4 C)     Temp Source Oral     SpO2 99 %     Weight 120 lb (54.4 kg)     Height 5\' 8"  (1.727 m)   Constitutional: Awake. Alert.  Eyes: Bilateral cataracts.  ENT   Head: Normocephalic and atraumatic.   Nose: No congestion/rhinnorhea.   Mouth/Throat: Mucous membranes are moist.   Neck: No stridor. Hematological/Lymphatic/Immunilogical: No cervical lymphadenopathy. Cardiovascular: Normal rate, regular rhythm.  No murmurs, rubs, or gallops.  Respiratory: Normal respiratory effort without tachypnea nor retractions. Breath sounds are clear and equal bilaterally. No wheezes/rales/rhonchi. Gastrointestinal: Soft and non tender. No rebound. No guarding.  Genitourinary: Deferred Musculoskeletal: Normal range of motion in all extremities. No lower extremity edema. Neurologic:  Slurred speech. Moving all extremities Skin:  Skin is warm, dry and intact. No rash noted.   ____________________________________________    LABS (pertinent positives/negatives)  Dilantin 40.4 CBC wbc 9.6, hgb 13.1, plt 209 BMP cr 1.53, k 3.4  ____________________________________________   EKG  None  ____________________________________________    RADIOLOGY  None  ____________________________________________   PROCEDURES  Procedures  ____________________________________________   INITIAL IMPRESSION / ASSESSMENT AND PLAN / ED COURSE  Pertinent labs & imaging results that were available during my care of the patient were reviewed by me and considered in my medical decision making (see chart for details).  Patient presents because of concern for agitation and tremors. ddx would be broad including electrolyte abnormality, infection, toxicity amongst other etiologies.  Dilantin level is roughly 2 times the top of the theraputic limit. Will plan on admission. Discussed with family.  ____________________________________________   FINAL CLINICAL IMPRESSION(S) / ED DIAGNOSES  Final diagnoses:  Accidental phenytoin poisoning, initial encounter     Note: This dictation was prepared with Dragon dictation. Any transcriptional errors that result from this process are unintentional     Phineas SemenGoodman, Merryl Buckels, MD 09/13/17 1610

## 2017-09-13 NOTE — ED Notes (Signed)
meds given forblood pressure.  Pt alert.  Family with pt.

## 2017-09-13 NOTE — H&P (Signed)
[facility] [place] [facility]   PATIENT NAME: Kelli ChurnDennis Brayman    MR#:  [id number]  DATE OF BIRTH:  Feb 12, 1956  DATE OF ADMISSION:  09/13/2017  PRIMARY CARE PHYSICIAN: Patient, No Pcp Per   REQUESTING/REFERRING PHYSICIAN: Dr. Derrill KayGoodman  CHIEF COMPLAINT:   Tremors and agitation HISTORY OF PRESENT ILLNESS:  Kelli ChurnDennis Duval  is a 61 y.o. male with a known history of hypertension, seizures and stroke is brought into the ED by the family members as patient was having more tremors and getting agitated. Dilantin level is high in the ED. ED physician has discussed with the on-call neurologist who has recommended to stop Dilantin and admit the patient overnight for observation. Patient's wife and daughter are at bedside  PAST MEDICAL HISTORY:   Past Medical History:  Diagnosis Date  . Hypertension   . Seizures (HCC)   . Stroke [facility](HCC)     PAST SURGICAL HISTOIRY:   Past Surgical History:  Procedure Laterality Date  . blind    . CLOSED REDUCTION CRANIOFACIAL SEPARATION    . COLONOSCOPY N/A 08/02/2017   Procedure: COLONOSCOPY;  Surgeon: Pasty Spillersahiliani, Varnita B, MD;  Location: [facility] ENDOSCOPY;  Service: Endoscopy;  Laterality: N/A;  . ESOPHAGOGASTRODUODENOSCOPY (EGD) WITH PROPOFOL N/A 07/31/2017   Procedure: ESOPHAGOGASTRODUODENOSCOPY (EGD) WITH PROPOFOL;  Surgeon: Pasty Spillersahiliani, Varnita B, MD;  Location: [facility] ENDOSCOPY;  Service: Endoscopy;  Laterality: N/A;  . LUNG REMOVAL, PARTIAL Right     SOCIAL HISTORY:   Social History   Tobacco Use  . Smoking status: Former Games developermoker  . Smokeless tobacco: Never Used  Substance Use Topics  . Alcohol use: No    FAMILY HISTORY:   Family History  Problem Relation Age of Onset  . Hypertension Mother   . CAD Mother     DRUG ALLERGIES:   Allergies  Allergen Reactions  . Iodine Anaphylaxis and Itching  . Shellfish Allergy Hives and Swelling    REVIEW OF SYSTEMS:  CONSTITUTIONAL: No fever, fatigue or weakness.  EYES:  Patient is legally blind EARS, NOSE, AND THROAT: No tinnitus or ear pain. Nonverbal RESPIRATORY: No cough, shortness of breath, wheezing or hemoptysis.  CARDIOVASCULAR: No chest pain, orthopnea, edema.  GASTROINTESTINAL: No nausea, vomiting, diarrhea or abdominal pain.  GENITOURINARY: No dysuria, hematuria.  ENDOCRINE: No polyuria, nocturia,  HEMATOLOGY: No anemia, easy bruising or bleeding SKIN: No rash or lesion. MUSCULOSKELETAL: Bedbound  NEUROLOGIC: No tingling, numbness, weakness.  PSYCHIATRY: No anxiety or depression.   MEDICATIONS AT HOME:   Prior to Admission medications   Medication Sig Start Date End Date Taking? Authorizing Provider  aspirin EC 81 MG EC tablet Take 1 tablet (81 mg total) by mouth daily. 02/16/17  Yes Auburn BilberryPatel, Shreyang, MD  atorvastatin (LIPITOR) 40 MG tablet Take 1 tablet (40 mg total) by mouth daily at 6 PM. Patient taking differently: Take 40 mg daily by mouth.  02/15/17  Yes Auburn BilberryPatel, Shreyang, MD  cloNIDine (CATAPRES) 0.2 MG tablet Take 1 tablet (0.2 mg total) by mouth 2 (two) times daily. 02/15/17  Yes Auburn BilberryPatel, Shreyang, MD  ferrous sulfate 325 (65 FE) MG tablet Take 1 tablet (325 mg total) 3 (three) times daily with meals by mouth. 08/02/17  Yes Shaune Pollackhen, Qing, MD  hydrALAZINE (APRESOLINE) 100 MG tablet Take 100 mg by mouth 3 (three) times daily. 04/19/17  Yes [provider]  hydrochlorothiazide (HYDRODIURIL) 25 MG tablet Take 1 tablet (25 mg total) by mouth daily. 04/13/17  Yes Enedina FinnerPatel, Sona, MD  hydrOXYzine (ATARAX/VISTARIL) 25 MG tablet  Take 1 tablet (25 mg total) by mouth every 6 (six) hours as needed for anxiety. 09/05/17  Yes Sharman Cheek, MD  omeprazole (PRILOSEC) 20 MG capsule Take 1 capsule by mouth daily. 08/31/17  Yes [provider]  ondansetron (ZOFRAN ODT) 4 MG disintegrating tablet Take 1 tablet (4 mg total) every 8 (eight) hours as needed by mouth for nausea or vomiting. 08/11/17  Yes Jene Every, MD  phenytoin (DILANTIN) 100 MG ER  capsule Take 400 mg by mouth at bedtime.    Yes [provider]  polyethylene glycol (MIRALAX / GLYCOLAX) packet Take 17 g by mouth daily. Patient taking differently: Take 17 g daily as needed by mouth.  02/15/17  Yes Auburn Bilberry, MD  pantoprazole (PROTONIX) 40 MG tablet Take 1 tablet (40 mg total) daily by mouth. Patient not taking: Reported on 09/13/2017 08/02/17   Shaune Pollack, MD      VITAL SIGNS:  Blood pressure (!) 176/123, pulse 72, temperature 97.6 F (36.4 C), temperature source Oral, resp. rate 17, height 5\' 8"  (1.727 m), weight 54.4 kg (120 lb), SpO2 100 %.  PHYSICAL EXAMINATION:  GENERAL:  61 y.o.-year-old patient lying in the bed with no acute distress.  EYES: Legally blind No scleral icterus. Extraocular muscles intact.  HEENT: Head atraumatic, normocephalic. Oropharynx and nasopharynx clear.  NECK:  Supple, no jugular venous distention. No thyroid enlargement, no tenderness.  LUNGS: Normal breath sounds bilaterally, no wheezing, rales,rhonchi or crepitation. No use of accessory muscles of respiration.  CARDIOVASCULAR: S1, S2 normal. No murmurs, rubs, or gallops.  ABDOMEN: Soft, nontender, nondistended. Bowel sounds present. No organomegaly or mass.  EXTREMITIES: No pedal edema, cyanosis, or clubbing.  NEUROLOGIC: Bedbound Gait not checked.  PSYCHIATRIC: The patient is alert and oriented x 3.  SKIN: No obvious rash, lesion, or ulcer.   LABORATORY PANEL:   CBC Recent Labs  Lab 09/13/17 1357  WBC 9.6  HGB 13.1  HCT 41.6  PLT 209   ------------------------------------------------------------------------------------------------------------------  Chemistries  Recent Labs  Lab 09/13/17 1357  NA 142  K 3.4*  CL 105  CO2 29  GLUCOSE 138*  BUN 35*  CREATININE 1.53*  CALCIUM 9.3   ------------------------------------------------------------------------------------------------------------------  Cardiac Enzymes No results for input(s): TROPONINI in  the last 168 hours. ------------------------------------------------------------------------------------------------------------------  RADIOLOGY:  No results found.  EKG:   Orders placed or performed during the hospital encounter of 09/09/17  . EKG 12-Lead  . EKG 12-Lead    IMPRESSION AND PLAN:   Zaryan Yakubov  is a 61 y.o. male with a known history of hypertension, seizures and stroke is brought into the ED by the family members as patient was having more tremors and getting agitated. Dilantin level is high in the ED. ED physician has discussed with the on-call neurologist who has recommended to stop Dilantin and admit the patient overnight  # Dilantin toxicity Admitted to MedSurg unit Hold Dilantin and repeat Dilantin levels IV fluids Neurology consulted  #Acute kidney injury Hold hydrochlorothiazide his home medication Hydrated with IV fluids and check a.m. Labs Avoid nephrotoxins  #Hypokalemia Replete potassium supplements and recheck in a.m. Check magnesium level  #Essential hypertension Hold hydrochlorothiazide and resume his home medication hydralazine, clonidine    #History of CVA Patient is blind, nonverbal and bedbound Needs feeding assistance  GI prophylaxis with Protonix DVT prophylaxis with Lovenox subcutaneous   All the records are reviewed and case discussed with ED provider. Management plans discussed with the patient, family and they are in agreement.  CODE  STATUS: fc wife and daughter healthcare power of attorney  TOTAL TIME TAKING CARE OF THIS PATIENT: 45 minutes.   Note: This dictation was prepared with Dragon dictation along with smaller phrase technology. Any transcriptional errors that result from this process are unintentional.  Ramonita LabGouru, Piercen Covino M.D on 09/13/2017 at 6:01 PM  Between 7am to 6pm - Pager - [phone]  After 6pm go to [email or web address] - password [email or web address]  [facility]  Office  [phone]  CC: Primary  care physician; Patient, No Pcp Per

## 2017-09-13 NOTE — ED Notes (Signed)
Pt resting quietly.  Family with pt.   

## 2017-09-13 NOTE — ED Notes (Signed)
Resumed care from emma rn.  Pt wake.  Daughter with pt.  Pt taking sips of water.  Iv meds given.  . Pt moans out. Pt is blind.

## 2017-09-13 NOTE — ED Notes (Signed)
Pt waiting on admission.

## 2017-09-14 DIAGNOSIS — N179 Acute kidney failure, unspecified: Secondary | ICD-10-CM | POA: Diagnosis not present

## 2017-09-14 DIAGNOSIS — G40909 Epilepsy, unspecified, not intractable, without status epilepticus: Secondary | ICD-10-CM | POA: Diagnosis not present

## 2017-09-14 DIAGNOSIS — T420X1A Poisoning by hydantoin derivatives, accidental (unintentional), initial encounter: Principal | ICD-10-CM

## 2017-09-14 DIAGNOSIS — E876 Hypokalemia: Secondary | ICD-10-CM | POA: Diagnosis not present

## 2017-09-14 LAB — BASIC METABOLIC PANEL
ANION GAP: 7 (ref 5–15)
BUN: 29 mg/dL — ABNORMAL HIGH (ref 6–20)
CALCIUM: 9.3 mg/dL (ref 8.9–10.3)
CO2: 29 mmol/L (ref 22–32)
Chloride: 101 mmol/L (ref 101–111)
Creatinine, Ser: 1.39 mg/dL — ABNORMAL HIGH (ref 0.61–1.24)
GFR, EST NON AFRICAN AMERICAN: 54 mL/min — AB (ref >60)
Glucose, Bld: 96 mg/dL (ref 65–99)
Potassium: 4.1 mmol/L (ref 3.5–5.1)
Sodium: 137 mmol/L (ref 135–145)

## 2017-09-14 LAB — CBC
HCT: 41.3 % (ref 40.0–52.0)
HEMOGLOBIN: 12.9 g/dL — AB (ref 13.0–18.0)
MCH: 25.9 pg — ABNORMAL LOW (ref 26.0–34.0)
MCHC: 31.2 g/dL — ABNORMAL LOW (ref 32.0–36.0)
MCV: 82.9 fL (ref 80.0–100.0)
Platelets: 205 10*3/uL (ref 150–440)
RBC: 4.99 MIL/uL (ref 4.40–5.90)
RDW: 24 % — ABNORMAL HIGH (ref 11.5–14.5)
WBC: 6.1 10*3/uL (ref 3.8–10.6)

## 2017-09-14 LAB — MAGNESIUM: MAGNESIUM: 1.9 mg/dL (ref 1.7–2.4)

## 2017-09-14 LAB — PHENYTOIN LEVEL, TOTAL: PHENYTOIN LVL: 34.7 ug/mL — AB (ref 10.0–20.0)

## 2017-09-14 LAB — ALBUMIN: ALBUMIN: 3.5 g/dL (ref 3.5–5.0)

## 2017-09-14 NOTE — Consult Note (Signed)
Reason for Consult:Dilantin toxicity Referring Physician: Nemiah CommanderKalisetti  CC: Tremors  HPI: [name] is an 61 y.o. male with a history of seizures and stroke who is unable to provide any history today.  Family unavailable therefore all history obtained from the chart.  Patient seen in the ED multiple times recently for possible seizure.  On the first occasion of 12/12, Dilantin level was <2.5.  Patient was loaded.  On 12/16 level was 25.4.  Family reported that they were under the impression that he was to receive 700mg  of Dilantin a day and he has been receiving that dose.  ON yesterday was brought in for tremors and agitation.  Level on yesterday of 40.4.    Past Medical History:  Diagnosis Date  . Hypertension   . Seizures (HCC)   . Stroke [facility](HCC)     Past Surgical History:  Procedure Laterality Date  . blind    . CLOSED REDUCTION CRANIOFACIAL SEPARATION    . COLONOSCOPY N/A 08/02/2017   Procedure: COLONOSCOPY;  Surgeon: Pasty Spillersahiliani, Varnita B, MD;  Location: [facility] ENDOSCOPY;  Service: Endoscopy;  Laterality: N/A;  . ESOPHAGOGASTRODUODENOSCOPY (EGD) WITH PROPOFOL N/A 07/31/2017   Procedure: ESOPHAGOGASTRODUODENOSCOPY (EGD) WITH PROPOFOL;  Surgeon: Pasty Spillersahiliani, Varnita B, MD;  Location: [facility] ENDOSCOPY;  Service: Endoscopy;  Laterality: N/A;  . LUNG REMOVAL, PARTIAL Right     Family History  Problem Relation Age of Onset  . Hypertension Mother   . CAD Mother     Social History:  reports that he has quit smoking. he has never used smokeless tobacco. He reports that he does not drink alcohol or use drugs.  Allergies  Allergen Reactions  . Iodine Anaphylaxis and Itching  . Shellfish Allergy Hives and Swelling    Medications:  I have reviewed the patient's current medications. Prior to Admission:  Medications Prior to Admission  Medication Sig Dispense Refill Last Dose  . aspirin EC 81 MG EC tablet Take 1 tablet (81 mg total) by mouth daily.   09/12/2017 at 2100  . atorvastatin  (LIPITOR) 40 MG tablet Take 1 tablet (40 mg total) by mouth daily at 6 PM. (Patient taking differently: Take 40 mg daily by mouth. ) 30 tablet 0 09/12/2017 at 2100  . cloNIDine (CATAPRES) 0.2 MG tablet Take 1 tablet (0.2 mg total) by mouth 2 (two) times daily. 60 tablet 11 09/13/2017 at 0530  . ferrous sulfate 325 (65 FE) MG tablet Take 1 tablet (325 mg total) 3 (three) times daily with meals by mouth. 90 tablet 0 09/13/2017 at 1200  . hydrALAZINE (APRESOLINE) 100 MG tablet Take 100 mg by mouth 3 (three) times daily.   09/13/2017 at 1200  . hydrochlorothiazide (HYDRODIURIL) 25 MG tablet Take 1 tablet (25 mg total) by mouth daily. 30 tablet 0 09/13/2017 at 0530  . hydrOXYzine (ATARAX/VISTARIL) 25 MG tablet Take 1 tablet (25 mg total) by mouth every 6 (six) hours as needed for anxiety. 20 tablet 0 prn at prn  . omeprazole (PRILOSEC) 20 MG capsule Take 1 capsule by mouth daily.   09/13/2017 at 0530  . ondansetron (ZOFRAN ODT) 4 MG disintegrating tablet Take 1 tablet (4 mg total) every 8 (eight) hours as needed by mouth for nausea or vomiting. 20 tablet 0 prn at prn  . phenytoin (DILANTIN) 100 MG ER capsule Take 400 mg by mouth at bedtime.    09/13/2017 at 0530  . polyethylene glycol (MIRALAX / GLYCOLAX) packet Take 17 g by mouth daily. (Patient taking differently: Take 17 g daily  as needed by mouth. ) 14 each 0 prn at prn  . pantoprazole (PROTONIX) 40 MG tablet Take 1 tablet (40 mg total) daily by mouth. (Patient not taking: Reported on 09/13/2017) 30 tablet 0 Not Taking at Unknown time   Scheduled: . aspirin EC  81 mg Oral Daily  . atorvastatin  40 mg Oral q1800  . cloNIDine  0.2 mg Oral BID  . enoxaparin (LOVENOX) injection  40 mg Subcutaneous Q24H  . ferrous sulfate  325 mg Oral TID WC  . hydrALAZINE  100 mg Oral TID  . pantoprazole  40 mg Oral Daily    ROS: Unable to obtain due to mental status  Physical Examination: Blood pressure 139/83, pulse 92, temperature (!) 97.5 F (36.4 C),  temperature source Oral, resp. rate 14, height 5\' 8"  (1.727 m), weight 54.4 kg (120 lb), SpO2 99 %.  HEENT-  Normocephalic, no lesions, without obvious abnormality.  Clouding of eyes.  Normal TM's bilaterally.  Normal auditory canals and external ears. Normal external nose, mucus membranes and septum.  Normal pharynx. Cardiovascular- S1, S2 normal, pulses palpable throughout   Lungs- chest clear, no wheezing, rales, normal symmetric air entry Abdomen- soft, non-tender; bowel sounds normal; no masses,  no organomegaly Extremities- no edema Lymph-no adenopathy palpable Musculoskeletal-no joint tenderness, deformity or swelling Skin-warm and dry, no hyperpigmentation, vitiligo, or suspicious lesions  Neurological Examination   Mental Status: Patient alerts to name being called.  Responds "yes" to most questions and grunts.  Follows simple commands Cranial Nerves: II: Blind with pupils clouded over III,IV,VI: doll's response absent bilaterally.  V,VII: grimace symmetric VIII: grossly intact IX,X: gag reflex reduced, XI: trapezius strength unable to test bilaterally XII: tongue strength unable to test Motor: Moves all extremities against gravity Sensory: Responds to light touch in all extremities Deep Tendon Reflexes:  2+ in the upper extremities and absent in the lower extremities Plantars: Mute bilaterally Cerebellar: Unable to perform due to sight  Gait: not tested due to safety concerns   Laboratory Studies:   Basic Metabolic Panel: Recent Labs  Lab 09/09/17 2153 09/13/17 1357 09/14/17 0612  NA 141 142 137  K 3.6 3.4* 4.1  CL 104 105 101  CO2 29 29 29   GLUCOSE 118* 138* 96  BUN 24* 35* 29*  CREATININE 1.35* 1.53* 1.39*  CALCIUM 9.6 9.3 9.3  MG  --   --  1.9    Liver Function Tests: No results for input(s): AST, ALT, ALKPHOS, BILITOT, PROT, ALBUMIN in the last 168 hours. No results for input(s): LIPASE, AMYLASE in the last 168 hours. No results for input(s):  AMMONIA in the last 168 hours.  CBC: Recent Labs  Lab 09/09/17 2153 09/13/17 1357 09/14/17 0612  WBC 6.2 9.6 6.1  NEUTROABS  --  7.0*  --   HGB 13.1 13.1 12.9*  HCT 42.7 41.6 41.3  MCV 81.5 82.9 82.9  PLT 179 209 205    Cardiac Enzymes: No results for input(s): CKTOTAL, CKMB, CKMBINDEX, TROPONINI in the last 168 hours.  BNP: Invalid input(s): POCBNP  CBG: No results for input(s): GLUCAP in the last 168 hours.  Microbiology: Results for orders placed or performed during the hospital encounter of 07/29/17  Urine Culture     Status: Abnormal   Collection Time: 07/31/17  9:37 PM  Result Value Ref Range Status   Specimen Description URINE, RANDOM  Final   Special Requests NONE  Final   Culture >=100,000 COLONIES/mL ESCHERICHIA COLI (A)  Final   Report  Status 08/04/2017 FINAL  Final   Organism ID, Bacteria ESCHERICHIA COLI (A)  Final      Susceptibility   Escherichia coli - MIC*    AMPICILLIN <=2 SENSITIVE Sensitive     CEFAZOLIN <=4 SENSITIVE Sensitive     CEFTRIAXONE <=1 SENSITIVE Sensitive     CIPROFLOXACIN <=0.25 SENSITIVE Sensitive     GENTAMICIN <=1 SENSITIVE Sensitive     IMIPENEM <=0.25 SENSITIVE Sensitive     NITROFURANTOIN <=16 SENSITIVE Sensitive     TRIMETH/SULFA >=320 RESISTANT Resistant     AMPICILLIN/SULBACTAM <=2 SENSITIVE Sensitive     PIP/TAZO <=4 SENSITIVE Sensitive     Extended ESBL NEGATIVE Sensitive     * >=100,000 COLONIES/mL ESCHERICHIA COLI    Coagulation Studies: No results for input(s): LABPROT, INR in the last 72 hours.  Urinalysis: No results for input(s): COLORURINE, LABSPEC, PHURINE, GLUCOSEU, HGBUR, BILIRUBINUR, KETONESUR, PROTEINUR, UROBILINOGEN, NITRITE, LEUKOCYTESUR in the last 168 hours.  Invalid input(s): APPERANCEUR  Lipid Panel:  No results found for: CHOL, TRIG, HDL, CHOLHDL, VLDL, LDLCALC  HgbA1C:  Lab Results  Component Value Date   HGBA1C 5.3 02/13/2017    Urine Drug Screen:  No results found for: LABOPIA,  COCAINSCRNUR, LABBENZ, AMPHETMU, THCU, LABBARB  Alcohol Level: No results for input(s): ETH in the last 168 hours.  Other results: EKG: sinus rhythm at 84 bpm.  Imaging: No results found.   Assessment/Plan: 61 year old male with history of seizure disorder and stroke with residual aphasia who presents with agitation and tremors.  Found to be Dilantin toxic at 40.4.  It appears there may have been some misunderstanding on how he should be dosed.  Repeat level today remains supratherapeutic.    Recommendations: 1.  Seizure precautions 2.  Hold Dilantin with daily levels until therapeutic.  Would then restart at 400mg  a day.   3.  If continued seizures once therapeutic with Dilantin would consider starting Lamictal at 25mg  BID 4.  Ativan prn seizure activity   Thana Farr, MD Neurology [phone] 09/14/2017, 2:09 PM

## 2017-09-14 NOTE — Care Management Obs Status (Signed)
MEDICARE OBSERVATION STATUS NOTIFICATION   Patient Details  Name: [name] MRN: [id number] Date of Birth: 20-Nov-1955   Medicare Observation Status Notification Given:  No(voicemail left for patient's wife )    Chapman FitchBOWEN, Hailley Byers T, RN 09/14/2017, 3:29 PM

## 2017-09-14 NOTE — Progress Notes (Signed)
Notified MD about dilantin level from 09/13/17; order to get another  Stat lab; order transcribed and d/c;, waiting on lab results from am.

## 2017-09-14 NOTE — Progress Notes (Signed)
Notified  Dr.  Tobi BastosPyreddy about patient dilantin level been elevated; order to repeat level stat; seizure precautions maintained; Will continue to monitor.

## 2017-09-14 NOTE — Progress Notes (Signed)
Notified Dr. Nemiah CommanderKalisetti of critical phenytoin level of 34.7, level is trending down from last results of 40.4. Per Dr.Kalisetti labs will be re-drawn later today.
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
## 2017-09-15 DIAGNOSIS — R569 Unspecified convulsions: Secondary | ICD-10-CM | POA: Diagnosis not present

## 2017-09-15 DIAGNOSIS — T420X1A Poisoning by hydantoin derivatives, accidental (unintentional), initial encounter: Secondary | ICD-10-CM | POA: Diagnosis not present

## 2017-09-15 DIAGNOSIS — E876 Hypokalemia: Secondary | ICD-10-CM | POA: Diagnosis not present

## 2017-09-15 DIAGNOSIS — G40909 Epilepsy, unspecified, not intractable, without status epilepticus: Secondary | ICD-10-CM | POA: Diagnosis not present

## 2017-09-15 DIAGNOSIS — Z743 Need for continuous supervision: Secondary | ICD-10-CM | POA: Diagnosis not present

## 2017-09-15 DIAGNOSIS — N179 Acute kidney failure, unspecified: Secondary | ICD-10-CM | POA: Diagnosis not present

## 2017-09-15 LAB — BASIC METABOLIC PANEL
ANION GAP: 7 (ref 5–15)
BUN: 26 mg/dL — ABNORMAL HIGH (ref 6–20)
CHLORIDE: 106 mmol/L (ref 101–111)
CO2: 25 mmol/L (ref 22–32)
CREATININE: 1.5 mg/dL — AB (ref 0.61–1.24)
Calcium: 8.8 mg/dL — ABNORMAL LOW (ref 8.9–10.3)
GFR calc non Af Amer: 49 mL/min — ABNORMAL LOW (ref >60)
GFR, EST AFRICAN AMERICAN: 57 mL/min — AB (ref >60)
Glucose, Bld: 93 mg/dL (ref 65–99)
POTASSIUM: 4.1 mmol/L (ref 3.5–5.1)
Sodium: 138 mmol/L (ref 135–145)

## 2017-09-15 LAB — PHENYTOIN LEVEL, TOTAL: Phenytoin Lvl: 24.7 ug/mL — ABNORMAL HIGH (ref 10.0–20.0)
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
## 2017-09-15 NOTE — Clinical Social Work Note (Signed)
CSW received verbal consult for transportation. CSW has arranged medical necessity form for the patient to travel by EMS due to the seizure history. The CSW has provided a taxi voucher for family to return home to receive him as he is legally blind and family cannot travel with him via EMS. CSW is signing off. Please consult should needs arise.  Argentina PonderKaren Martha Suzzette Gasparro, MSW, Theresia MajorsLCSWA [phone]

## 2017-09-15 NOTE — Progress Notes (Signed)
[name] to be D/C'd Home per MD order.  Discussed prescriptions and follow up appointments with the patient. Prescriptions given to patient, medication list explained in detail. Pt verbalized understanding.  Allergies as of 09/15/2017      Reactions   Iodine Anaphylaxis, Itching   Shellfish Allergy Hives, Swelling      Medication List    STOP taking these medications   pantoprazole 40 MG tablet Commonly known as:  PROTONIX     TAKE these medications   aspirin 81 MG EC tablet Take 1 tablet (81 mg total) by mouth daily.   atorvastatin 40 MG tablet Commonly known as:  LIPITOR Take 1 tablet (40 mg total) by mouth daily at 6 PM. What changed:  when to take this   cloNIDine 0.2 MG tablet Commonly known as:  CATAPRES Take 1 tablet (0.2 mg total) by mouth 2 (two) times daily.   ferrous sulfate 325 (65 FE) MG tablet Take 1 tablet (325 mg total) 3 (three) times daily with meals by mouth.   hydrALAZINE 100 MG tablet Commonly known as:  APRESOLINE Take 100 mg by mouth 3 (three) times daily.   hydrochlorothiazide 25 MG tablet Commonly known as:  HYDRODIURIL Take 1 tablet (25 mg total) by mouth daily.   hydrOXYzine 25 MG tablet Commonly known as:  ATARAX/VISTARIL Take 1 tablet (25 mg total) by mouth every 6 (six) hours as needed for anxiety.   omeprazole 20 MG capsule Commonly known as:  PRILOSEC Take 1 capsule by mouth daily.   ondansetron 4 MG disintegrating tablet Commonly known as:  ZOFRAN ODT Take 1 tablet (4 mg total) every 8 (eight) hours as needed by mouth for nausea or vomiting.   phenytoin 100 MG ER capsule Commonly known as:  DILANTIN Take 400 mg by mouth at bedtime.   polyethylene glycol packet Commonly known as:  MIRALAX / GLYCOLAX Take 17 g by mouth daily. What changed:    when to take this  reasons to take this       Vitals:   09/15/17 1249 09/15/17 1436  BP: (!) 144/87 (!) 109/44  Pulse: 91 78  Resp: 14 18  Temp: 98.2 F (36.8 C) 98.3 F  (36.8 C)  SpO2: 98% 100%    Skin clean, dry and intact without evidence of skin break down, no evidence of skin tears noted. IV catheter discontinued intact. Site without signs and symptoms of complications. Dressing and pressure applied. Pt denies pain at this time. No complaints noted.  An After Visit Summary was printed and given to the patient. Patient transported home by EMS. [name]

## 2017-09-17 LAB — PHENYTOIN LEVEL, FREE AND TOTAL
PHENYTOIN FREE: 2.8 ug/mL — AB (ref 1.0–2.0)
Phenytoin, Total: 31.3 ug/mL — ABNORMAL HIGH (ref 10.0–20.0)

## 2017-09-21 ENCOUNTER — Emergency Department: Payer: Medicare Other

## 2017-09-21 ENCOUNTER — Inpatient Hospital Stay
Admission: EM | Admit: 2017-09-21 | Discharge: 2017-09-26 | DRG: 690 | Disposition: A | Discharge status: Skilled Nursing Facility | Payer: Medicare Other | Attending: Internal Medicine | Admitting: Internal Medicine

## 2017-09-21 ENCOUNTER — Other Ambulatory Visit: Payer: Self-pay

## 2017-09-21 ENCOUNTER — Encounter: Payer: Self-pay | Admitting: *Deleted

## 2017-09-21 DIAGNOSIS — N183 Chronic kidney disease, stage 3 (moderate): Secondary | ICD-10-CM | POA: Diagnosis present

## 2017-09-21 DIAGNOSIS — Z87891 Personal history of nicotine dependence: Secondary | ICD-10-CM

## 2017-09-21 DIAGNOSIS — Z7982 Long term (current) use of aspirin: Secondary | ICD-10-CM

## 2017-09-21 DIAGNOSIS — R51 Headache: Secondary | ICD-10-CM | POA: Diagnosis present

## 2017-09-21 DIAGNOSIS — K449 Diaphragmatic hernia without obstruction or gangrene: Secondary | ICD-10-CM | POA: Diagnosis not present

## 2017-09-21 DIAGNOSIS — G40909 Epilepsy, unspecified, not intractable, without status epilepticus: Secondary | ICD-10-CM | POA: Diagnosis present

## 2017-09-21 DIAGNOSIS — Z79899 Other long term (current) drug therapy: Secondary | ICD-10-CM

## 2017-09-21 DIAGNOSIS — N390 Urinary tract infection, site not specified: Secondary | ICD-10-CM | POA: Diagnosis not present

## 2017-09-21 DIAGNOSIS — W06XXXA Fall from bed, initial encounter: Secondary | ICD-10-CM | POA: Diagnosis not present

## 2017-09-21 DIAGNOSIS — N3000 Acute cystitis without hematuria: Principal | ICD-10-CM | POA: Diagnosis present

## 2017-09-21 DIAGNOSIS — I69351 Hemiplegia and hemiparesis following cerebral infarction affecting right dominant side: Secondary | ICD-10-CM

## 2017-09-21 DIAGNOSIS — R531 Weakness: Secondary | ICD-10-CM

## 2017-09-21 DIAGNOSIS — K5900 Constipation, unspecified: Secondary | ICD-10-CM | POA: Diagnosis present

## 2017-09-21 DIAGNOSIS — M6281 Muscle weakness (generalized): Secondary | ICD-10-CM | POA: Diagnosis not present

## 2017-09-21 DIAGNOSIS — Y92230 Patient room in hospital as the place of occurrence of the external cause: Secondary | ICD-10-CM | POA: Diagnosis not present

## 2017-09-21 DIAGNOSIS — E785 Hyperlipidemia, unspecified: Secondary | ICD-10-CM | POA: Diagnosis not present

## 2017-09-21 DIAGNOSIS — R42 Dizziness and giddiness: Secondary | ICD-10-CM

## 2017-09-21 DIAGNOSIS — I129 Hypertensive chronic kidney disease with stage 1 through stage 4 chronic kidney disease, or unspecified chronic kidney disease: Secondary | ICD-10-CM | POA: Diagnosis present

## 2017-09-21 DIAGNOSIS — R4702 Dysphasia: Secondary | ICD-10-CM | POA: Diagnosis present

## 2017-09-21 DIAGNOSIS — Z91041 Radiographic dye allergy status: Secondary | ICD-10-CM

## 2017-09-21 DIAGNOSIS — Z91013 Allergy to seafood: Secondary | ICD-10-CM

## 2017-09-21 DIAGNOSIS — G9340 Encephalopathy, unspecified: Secondary | ICD-10-CM | POA: Diagnosis not present

## 2017-09-21 LAB — COMPREHENSIVE METABOLIC PANEL
ALK PHOS: 189 U/L — AB (ref 38–126)
ALT: 44 U/L (ref 17–63)
ANION GAP: 7 (ref 5–15)
AST: 43 U/L — ABNORMAL HIGH (ref 15–41)
Albumin: 3.5 g/dL (ref 3.5–5.0)
BILIRUBIN TOTAL: 0.7 mg/dL (ref 0.3–1.2)
BUN: 28 mg/dL — ABNORMAL HIGH (ref 6–20)
CALCIUM: 9.2 mg/dL (ref 8.9–10.3)
CO2: 28 mmol/L (ref 22–32)
CREATININE: 1.48 mg/dL — AB (ref 0.61–1.24)
Chloride: 98 mmol/L — ABNORMAL LOW (ref 101–111)
GFR, EST AFRICAN AMERICAN: 58 mL/min — AB (ref >60)
GFR, EST NON AFRICAN AMERICAN: 50 mL/min — AB (ref >60)
Glucose, Bld: 115 mg/dL — ABNORMAL HIGH (ref 65–99)
Potassium: 3.8 mmol/L (ref 3.5–5.1)
SODIUM: 133 mmol/L — AB (ref 135–145)
TOTAL PROTEIN: 7.5 g/dL (ref 6.5–8.1)

## 2017-09-21 LAB — CBC
HCT: 40.9 % (ref 40.0–52.0)
HEMOGLOBIN: 13 g/dL (ref 13.0–18.0)
MCH: 26.4 pg (ref 26.0–34.0)
MCHC: 31.7 g/dL — AB (ref 32.0–36.0)
MCV: 83.2 fL (ref 80.0–100.0)
Platelets: 184 10*3/uL (ref 150–440)
RBC: 4.91 MIL/uL (ref 4.40–5.90)
RDW: 23.7 % — ABNORMAL HIGH (ref 11.5–14.5)
WBC: 6.3 10*3/uL (ref 3.8–10.6)

## 2017-09-21 LAB — TROPONIN I: TROPONIN I: 0.03 ng/mL — AB (ref <0.03)

## 2017-09-21 NOTE — ED Notes (Signed)
Date and time results received: 09/21/17 2259 (use smartphrase ".now" to insert current time)  Test: Trop I Critical Value: 0.03  Name of Provider Notified: Lord  Orders Received? Or Actions Taken?: No new orders 
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
## 2017-09-21 NOTE — ED Triage Notes (Signed)
Report per EMS: Increased weakness. Daughter is on phone presently [facility]. Report from daughter that father is having increased headaches. EMS stated that wife reports increased weakness in leg and daughter states that PT has not been to the house. Pt is poorly responsive and unable to participate in health care. No family is with pt.

## 2017-09-22 DIAGNOSIS — R1310 Dysphagia, unspecified: Secondary | ICD-10-CM | POA: Diagnosis not present

## 2017-09-22 DIAGNOSIS — N3000 Acute cystitis without hematuria: Secondary | ICD-10-CM | POA: Diagnosis not present

## 2017-09-22 DIAGNOSIS — R7889 Finding of other specified substances, not normally found in blood: Secondary | ICD-10-CM | POA: Diagnosis not present

## 2017-09-22 DIAGNOSIS — I10 Essential (primary) hypertension: Secondary | ICD-10-CM | POA: Diagnosis not present

## 2017-09-22 DIAGNOSIS — R531 Weakness: Secondary | ICD-10-CM

## 2017-09-22 LAB — URINALYSIS, COMPLETE (UACMP) WITH MICROSCOPIC
BACTERIA UA: NONE SEEN
BILIRUBIN URINE: NEGATIVE
Glucose, UA: NEGATIVE mg/dL
KETONES UR: NEGATIVE mg/dL
NITRITE: NEGATIVE
Protein, ur: NEGATIVE mg/dL
SQUAMOUS EPITHELIAL / LPF: NONE SEEN
Specific Gravity, Urine: 1.019 (ref 1.005–1.030)
pH: 5 (ref 5.0–8.0)

## 2017-09-22 LAB — TROPONIN I
Troponin I: 0.03 ng/mL (ref <0.03)
Troponin I: 0.03 ng/mL (ref <0.03)

## 2017-09-22 LAB — TSH: TSH: 0.373 u[IU]/mL (ref 0.350–4.500)

## 2017-09-22 LAB — GLUCOSE, CAPILLARY: GLUCOSE-CAPILLARY: 93 mg/dL (ref 65–99)

## 2017-09-22 LAB — PHENYTOIN LEVEL, TOTAL: Phenytoin Lvl: 26.6 ug/mL — ABNORMAL HIGH (ref 10.0–20.0)

## 2017-09-22 MED ORDER — PHENYTOIN SODIUM EXTENDED 100 MG PO CAPS
300.0000 mg | ORAL_CAPSULE | Freq: Every day | ORAL | Status: DC
  Administered 2017-09-23 – 2017-09-25 (×3): 300 mg via ORAL
  Filled 2017-09-22 (×4): qty 3

## 2017-09-22 MED ORDER — ASPIRIN EC 81 MG PO TBEC
81.0000 mg | DELAYED_RELEASE_TABLET | Freq: Every day | ORAL | Status: DC
  Administered 2017-09-22 – 2017-09-26 (×5): 81 mg via ORAL
  Filled 2017-09-22 (×5): qty 1

## 2017-09-22 MED ORDER — ENOXAPARIN SODIUM 40 MG/0.4ML ~~LOC~~ SOLN
40.0000 mg | SUBCUTANEOUS | Status: DC
  Administered 2017-09-22 – 2017-09-25 (×4): 40 mg via SUBCUTANEOUS
  Filled 2017-09-22 (×4): qty 0.4

## 2017-09-22 MED ORDER — SODIUM CHLORIDE 0.9 % IV SOLN
INTRAVENOUS | Status: DC
  Administered 2017-09-22: 06:00:00 via INTRAVENOUS

## 2017-09-22 MED ORDER — ACETAMINOPHEN 650 MG RE SUPP: 650.0000 mg | Freq: Four times a day (QID) | RECTAL | Status: DC | PRN

## 2017-09-22 MED ORDER — ONDANSETRON HCL 4 MG PO TABS: 4.0000 mg | ORAL_TABLET | Freq: Four times a day (QID) | ORAL | Status: DC | PRN

## 2017-09-22 MED ORDER — PANTOPRAZOLE SODIUM 40 MG PO TBEC
40.0000 mg | DELAYED_RELEASE_TABLET | Freq: Every day | ORAL | Status: DC
Start: 2017-09-22 — End: 2017-09-26
  Administered 2017-09-22 – 2017-09-26 (×5): 40 mg via ORAL
  Filled 2017-09-22 (×5): qty 1

## 2017-09-22 MED ORDER — FERROUS SULFATE 325 (65 FE) MG PO TABS
325.0000 mg | ORAL_TABLET | Freq: Three times a day (TID) | ORAL | Status: DC
  Administered 2017-09-22 – 2017-09-26 (×14): 325 mg via ORAL
  Filled 2017-09-22 (×15): qty 1

## 2017-09-22 MED ORDER — ATORVASTATIN CALCIUM 20 MG PO TABS
40.0000 mg | ORAL_TABLET | Freq: Every day | ORAL | Status: DC
Start: 2017-09-22 — End: 2017-09-26
  Administered 2017-09-22 – 2017-09-26 (×5): 40 mg via ORAL
  Filled 2017-09-22 (×5): qty 2

## 2017-09-22 MED ORDER — DOCUSATE SODIUM 100 MG PO CAPS
100.0000 mg | ORAL_CAPSULE | Freq: Two times a day (BID) | ORAL | Status: DC
  Administered 2017-09-22 – 2017-09-26 (×9): 100 mg via ORAL
  Filled 2017-09-22 (×9): qty 1

## 2017-09-22 MED ORDER — AMLODIPINE BESYLATE 5 MG PO TABS
5.0000 mg | ORAL_TABLET | Freq: Once | ORAL | Status: AC
  Administered 2017-09-22: 16:00:00 5 mg via ORAL
  Filled 2017-09-22: qty 1

## 2017-09-22 MED ORDER — CEFTRIAXONE SODIUM IN DEXTROSE 20 MG/ML IV SOLN
1.0000 g | Freq: Once | INTRAVENOUS | Status: AC
  Administered 2017-09-22: 1 g via INTRAVENOUS
  Filled 2017-09-22: qty 50

## 2017-09-22 MED ORDER — IPRATROPIUM-ALBUTEROL 0.5-2.5 (3) MG/3ML IN SOLN
3.0000 mL | Freq: Four times a day (QID) | RESPIRATORY_TRACT | Status: DC
  Administered 2017-09-22: 20:00:00 3 mL via RESPIRATORY_TRACT
  Filled 2017-09-22: qty 3

## 2017-09-22 MED ORDER — PHENYTOIN SODIUM EXTENDED 100 MG PO CAPS: 400.0000 mg | ORAL_CAPSULE | Freq: Every day | ORAL | Status: DC

## 2017-09-22 MED ORDER — ONDANSETRON HCL 4 MG/2ML IJ SOLN: 4.0000 mg | Freq: Four times a day (QID) | INTRAMUSCULAR | Status: DC | PRN

## 2017-09-22 MED ORDER — CEFTRIAXONE SODIUM 2 G IJ SOLR
2.0000 g | INTRAMUSCULAR | Status: DC
  Administered 2017-09-23 – 2017-09-24 (×2): 2 g via INTRAVENOUS
  Filled 2017-09-22 (×2): qty 2

## 2017-09-22 MED ORDER — HYDRALAZINE HCL 50 MG PO TABS
100.0000 mg | ORAL_TABLET | Freq: Three times a day (TID) | ORAL | Status: DC
  Administered 2017-09-22 – 2017-09-24 (×7): 100 mg via ORAL
  Filled 2017-09-22 (×8): qty 2

## 2017-09-22 MED ORDER — MAGNESIUM SULFATE 2 GM/50ML IV SOLN
2.0000 g | Freq: Once | INTRAVENOUS | Status: AC
Start: 2017-09-22 — End: 2017-09-22
  Administered 2017-09-22: 14:00:00 2 g via INTRAVENOUS
  Filled 2017-09-22: qty 50

## 2017-09-22 MED ORDER — AMLODIPINE BESYLATE 5 MG PO TABS
5.0000 mg | ORAL_TABLET | Freq: Every day | ORAL | Status: DC
  Administered 2017-09-22 – 2017-09-23 (×2): 5 mg via ORAL
  Filled 2017-09-22 (×2): qty 1

## 2017-09-22 MED ORDER — HYDROXYZINE HCL 25 MG PO TABS
25.0000 mg | ORAL_TABLET | Freq: Four times a day (QID) | ORAL | Status: DC | PRN
  Filled 2017-09-22: qty 1

## 2017-09-22 MED ORDER — ACETAMINOPHEN 325 MG PO TABS
650.0000 mg | ORAL_TABLET | Freq: Four times a day (QID) | ORAL | Status: DC | PRN
  Administered 2017-09-24 – 2017-09-25 (×3): 650 mg via ORAL
  Filled 2017-09-22 (×3): qty 2

## 2017-09-22 MED ORDER — PHENYTOIN SODIUM EXTENDED 30 MG PO CAPS: 30.0000 mg | ORAL_CAPSULE | Freq: Every day | ORAL | Status: DC

## 2017-09-22 MED ORDER — POLYETHYLENE GLYCOL 3350 17 G PO PACK
17.0000 g | PACK | Freq: Every day | ORAL | Status: DC | PRN
  Administered 2017-09-24: 17 g via ORAL
  Filled 2017-09-22: qty 1

## 2017-09-22 MED ORDER — SODIUM CHLORIDE 0.9 % IV SOLN
INTRAVENOUS | Status: DC
  Administered 2017-09-22: 14:00:00 via INTRAVENOUS

## 2017-09-22 MED ORDER — CLONIDINE HCL 0.1 MG PO TABS
0.2000 mg | ORAL_TABLET | Freq: Two times a day (BID) | ORAL | Status: DC
  Administered 2017-09-22 – 2017-09-26 (×8): 0.2 mg via ORAL
  Filled 2017-09-22 (×9): qty 2

## 2017-09-22 MED ORDER — HYDROCHLOROTHIAZIDE 25 MG PO TABS
25.0000 mg | ORAL_TABLET | Freq: Every day | ORAL | Status: DC
  Administered 2017-09-22 – 2017-09-26 (×5): 25 mg via ORAL
  Filled 2017-09-22 (×5): qty 1

## 2017-09-22 NOTE — ED Notes (Signed)
Patient transported to [address] 

## 2017-09-22 NOTE — Plan of Care (Signed)
  Progressing Education: Knowledge of General Education information will improve 09/22/2017 1818 - Progressing by Donnel SaxonKennedy, Bertrum Helmstetter L, RN Health Behavior/Discharge Planning: Ability to manage health-related needs will improve 09/22/2017 1818 - Progressing by Donnel SaxonKennedy, Rylen Hou L, RN Clinical Measurements: Ability to maintain clinical measurements within normal limits will improve 09/22/2017 1818 - Progressing by Donnel SaxonKennedy, Marcille Barman L, RN Will remain free from infection 09/22/2017 1818 - Progressing by Donnel SaxonKennedy, Romina Divirgilio L, RN Diagnostic test results will improve 09/22/2017 1818 - Progressing by Donnel SaxonKennedy, Shanisha Lech L, RN Respiratory complications will improve 09/22/2017 1818 - Progressing by Donnel SaxonKennedy, Kaevon Cotta L, RN Cardiovascular complication will be avoided 09/22/2017 1818 - Progressing by Donnel SaxonKennedy, Jasiyah Poland L, RN Activity: Risk for activity intolerance will decrease 09/22/2017 1818 - Progressing by Donnel SaxonKennedy, Navdeep Fessenden L, RN Nutrition: Adequate nutrition will be maintained 09/22/2017 1818 - Progressing by Donnel SaxonKennedy, Sultana Tierney L, RN Coping: Level of anxiety will decrease 09/22/2017 1818 - Progressing by Donnel SaxonKennedy, Maida Widger L, RN Elimination: Will not experience complications related to bowel motility 09/22/2017 1818 - Progressing by Donnel SaxonKennedy, Samiyah Stupka L, RN Will not experience complications related to urinary retention 09/22/2017 1818 - Progressing by Donnel SaxonKennedy, Lorene Klimas L, RN Pain Managment: General experience of comfort will improve 09/22/2017 1818 - Progressing by Donnel SaxonKennedy, Savina Olshefski L, RN Safety: Ability to remain free from injury will improve 09/22/2017 1818 - Progressing by Donnel SaxonKennedy, Travaris Kosh L, RN Skin Integrity: Risk for impaired skin integrity will decrease 09/22/2017 1818 - Progressing by Donnel SaxonKennedy, Timon Geissinger L, RN

## 2017-09-22 NOTE — Progress Notes (Signed)
Notified Dr. Renae GlossWieting of BP 172/97, received new orders norvasc, mag BP down to 148/91

## 2017-09-22 NOTE — Progress Notes (Signed)
Patient ID: [name], male   DOB: [birth year], 61 y.o.   MRN: [id number]  [employer] PROGRESS NOTE  Jerry ChurnDennis Bisher FAO:[id number] DOB: [birth year] DOA: 09/21/2017 PCP: Patient, No Pcp Per  HPI/Subjective: Patient was brought in by family for altered mental status.  Patient complains of headache.  He has prior history of stroke and right-sided weakness and unable to walk.  Family also complains that he is having trouble swallowing which is been going on for a while.  Objective: Vitals:   09/22/17 1100 09/22/17 1344  BP: (!) 159/98 (!) 172/97  Pulse: 78   Resp:    Temp: 98.4 F (36.9 C)   SpO2: 98%     Filed Weights   09/21/17 2213 09/22/17 0600  Weight: 63.5 kg (140 lb) 74.8 kg (164 lb 12.8 oz)    ROS: Review of Systems  Constitutional: Negative for chills and fever.  Respiratory: Positive for cough and shortness of breath.   Cardiovascular: Negative for chest pain.  Gastrointestinal: Negative for abdominal pain, constipation, diarrhea, nausea and vomiting.  Genitourinary: Negative for dysuria.  Musculoskeletal: Negative for joint pain.  Neurological: Positive for dizziness. Negative for headaches.   Exam: Physical Exam  Constitutional: He is oriented to person, place, and time.  HENT:  Nose: No mucosal edema.  Mouth/Throat: No oropharyngeal exudate or posterior oropharyngeal edema.  Eyes: Conjunctivae, EOM and lids are normal. Pupils are equal, round, and reactive to light.  Neck: No JVD present. Carotid bruit is not present. No edema present. No thyroid mass and no thyromegaly present.  Cardiovascular: S1 normal and S2 normal. Exam reveals no gallop.  No murmur heard. Pulses:      Dorsalis pedis pulses are 2+ on the right side, and 2+ on the left side.  Respiratory: No respiratory distress. He has decreased breath sounds in the right lower field and the left lower field. He has no wheezes. He has no rhonchi. He has no rales.  GI: Soft. Bowel sounds are normal.  There is no tenderness.  Musculoskeletal:       Right ankle: He exhibits no swelling.       Left ankle: He exhibits no swelling.  Lymphadenopathy:    He has no cervical adenopathy.  Neurological: He is alert and oriented to person, place, and time. No cranial nerve deficit.  Skin: Skin is warm. No rash noted. Nails show no clubbing.  Psychiatric: He has a normal mood and affect.      Data Reviewed: Basic Metabolic Panel: Recent Labs  Lab 09/21/17 2211  NA 133*  K 3.8  CL 98*  CO2 28  GLUCOSE 115*  BUN 28*  CREATININE 1.48*  CALCIUM 9.2   Liver Function Tests: Recent Labs  Lab 09/21/17 2211  AST 43*  ALT 44  ALKPHOS 189*  BILITOT 0.7  PROT 7.5  ALBUMIN 3.5   CBC: Recent Labs  Lab 09/21/17 2211  WBC 6.3  HGB 13.0  HCT 40.9  MCV 83.2  PLT 184   Cardiac Enzymes: Recent Labs  Lab 09/21/17 2211 09/22/17 0515  TROPONINI 0.03* 0.03*    CBG: Recent Labs  Lab 09/22/17 0331  GLUCAP 93     Studies: Dg Chest Portable 1 View  Result Date: 09/21/2017 CLINICAL DATA:  Acute onset of worsening leg weakness. Patient poorly responsive. EXAM: PORTABLE CHEST 1 VIEW COMPARISON:  Chest radiograph performed 09/05/2017 FINDINGS: The lungs are well-aerated. Postoperative change is noted at the right lung apex, and mild scarring is noted on the left.  Large bulla are noted at the left lung apex. There is no evidence of focal opacification, pleural effusion or pneumothorax. The cardiomediastinal silhouette is within normal limits. No acute osseous abnormalities are seen. A moderate hiatal hernia is noted. IMPRESSION: 1. Chronic lung changes.  No acute cardiopulmonary process seen. 2. Moderate hiatal hernia noted. Electronically Signed   By: Roanna RaiderJeffery  Patton M.D.   On: 09/21/2017 22:38    Scheduled Meds: . amLODipine  5 mg Oral Daily  . aspirin EC  81 mg Oral Daily  . atorvastatin  40 mg Oral Daily  . cloNIDine  0.2 mg Oral BID  . docusate sodium  100 mg Oral BID  .  enoxaparin (LOVENOX) injection  40 mg Subcutaneous Q24H  . ferrous sulfate  325 mg Oral TID WC  . hydrALAZINE  100 mg Oral TID  . hydrochlorothiazide  25 mg Oral Daily  . pantoprazole  40 mg Oral Daily  . [START ON 09/23/2017] phenytoin  300 mg Oral QHS   Continuous Infusions: . sodium chloride 40 mL/hr at 09/22/17 1347  . [START ON 09/23/2017] cefTRIAXone (ROCEPHIN) IVPB 2 gram/50 mL D5W (Pyxis)      Assessment/Plan:  1. Accelerated hypertension, headache.  Blood pressure elevated.  Patient already on hydrochlorothiazide hydralazine and clonidine.  Will add Norvasc 5 mg now and had a give another Norvasc 5 mg again this afternoon. 2. Acute cystitis on Rocephin 3. History of stroke and right-sided weakness on aspirin and atorvastatin 4. History of esophagitis and esophageal varices on Protonix 5. History of seizure.  Dilantin level being high I will hold Dilantin today and restart tomorrow and check a Dilantin level tomorrow morning. 6. Weakness physical therapy evaluation 7. Chronic dysphasia.  On dysphagia 2 diet with nectar thick liquids.  Chest x-ray showing chronic changes and moderate hiatal hernia  Code Status:     Code Status Orders  (From admission, onward)        Start     Ordered   09/22/17 0431  Full code  Continuous     09/22/17 0430    Code Status History    Date Active Date Inactive Code Status Order ID Comments User Context   09/13/2017 19:08 09/15/2017 18:34 Full Code [id number]  Ramonita LabGouru, Aruna, MD Inpatient   07/29/2017 16:48 08/02/2017 21:49 Full Code [id number]  Shaune Pollackhen, Qing, MD Inpatient   04/12/2017 04:43 04/13/2017 19:57 Full Code [id number]  Ihor AustinPyreddy, Pavan, MD Inpatient   02/14/2017 03:50 02/15/2017 20:09 Full Code [id number]  Arnaldo Nataliamond, Michael S, MD ED     Family Communication: Family at bedside Disposition Plan: To be determined  Antibiotics:  Rocephin  Time spent: 28 minutes  Makiah Clauson [employer]  [employer]

## 2017-09-22 NOTE — ED Provider Notes (Signed)
-----------------------------------------   3:41 AM on 09/22/2017 -----------------------------------------  I took over care of this patient from Dr. Lenard LancePaduchowski.The plan at the time of signout was to follow-up on his phenytoin level and urinalysis and dispo accordingly.  Patient's phenytoin level is slightly elevated, and his urinalysis is consistent with a UTI.  Apparently the family members had expressed some desire to [facility] "[facility]."  At this time based on patient's generalized weakness, and the presence of the above findings I believe that it will be best to admit the patient for IV antibiotics.  There is no medical indication [facility].  The patient's family did not identify any specific receiving physician to Dr. Lenard LancePaduchowski.  The patient's family is no longer present at the bedside.  I attempted to contact the patient's family members via multiple phone numbers listed in demographics in epic, but there was no answer.  Therefore in order to not further delay the patient's care, I will proceed with the admission here.  I signed the patient out to the hospitalist Dr. Sheryle Hailiamond.   Dionne BucySiadecki, Kermitt Harjo, MD 09/22/17 [phone]

## 2017-09-22 NOTE — H&P (Signed)
[name] is an 61 y.o. male.   Chief Complaint: Altered mental status HPI: Patient with past medical history of hypertension, epilepsy and stroke presents to the emergency department due to decrease from baseline mental status.  The patient is very difficult to understand but is usually more talkative and interactive.  His family is able to understand what he says but his speech sounds garbled at baseline.  He is well-known to the hospitalist service and does not appear to have any stroke symptoms.  Laboratory evaluation revealed urinary tract infection which was thought to contribute to his current mental status as well as overall strength which prompted the emergency department staff to call the hospitalist service for admission.  Past Medical History:  Diagnosis Date  . Hypertension   . Seizures ([place])   . Stroke [facility])     Past Surgical History:  Procedure Laterality Date  . blind    . CLOSED REDUCTION CRANIOFACIAL SEPARATION    . COLONOSCOPY N/A 08/02/2017   Procedure: COLONOSCOPY;  Surgeon: Virgel Manifold, MD;  Location: [facility] ENDOSCOPY;  Service: Endoscopy;  Laterality: N/A;  . ESOPHAGOGASTRODUODENOSCOPY (EGD) WITH PROPOFOL N/A 07/31/2017   Procedure: ESOPHAGOGASTRODUODENOSCOPY (EGD) WITH PROPOFOL;  Surgeon: Virgel Manifold, MD;  Location: [facility] ENDOSCOPY;  Service: Endoscopy;  Laterality: N/A;  . LUNG REMOVAL, PARTIAL Right     Family History  Problem Relation Age of Onset  . Hypertension Mother   . CAD Mother    Social History:  reports that he has quit smoking. he has never used smokeless tobacco. He reports that he does not drink alcohol or use drugs.  Allergies:  Allergies  Allergen Reactions  . Iodine Anaphylaxis and Itching  . Shellfish Allergy Hives and Swelling    Medications Prior to Admission  Medication Sig Dispense Refill  . aspirin EC 81 MG EC tablet Take 1 tablet (81 mg total) by mouth daily.    Marland Kitchen atorvastatin (LIPITOR) 40 MG tablet Take 1 tablet  (40 mg total) by mouth daily at 6 PM. (Patient taking differently: Take 40 mg daily by mouth. ) 30 tablet 0  . cloNIDine (CATAPRES) 0.2 MG tablet Take 1 tablet (0.2 mg total) by mouth 2 (two) times daily. 60 tablet 11  . ferrous sulfate 325 (65 FE) MG tablet Take 1 tablet (325 mg total) 3 (three) times daily with meals by mouth. 90 tablet 0  . hydrALAZINE (APRESOLINE) 100 MG tablet Take 100 mg by mouth 3 (three) times daily.    . hydrochlorothiazide (HYDRODIURIL) 25 MG tablet Take 1 tablet (25 mg total) by mouth daily. 30 tablet 0  . hydrOXYzine (ATARAX/VISTARIL) 25 MG tablet Take 1 tablet (25 mg total) by mouth every 6 (six) hours as needed for anxiety. 20 tablet 0  . omeprazole (PRILOSEC) 20 MG capsule Take 1 capsule by mouth daily.    . ondansetron (ZOFRAN ODT) 4 MG disintegrating tablet Take 1 tablet (4 mg total) every 8 (eight) hours as needed by mouth for nausea or vomiting. 20 tablet 0  . phenytoin (DILANTIN) 100 MG ER capsule Take 400 mg by mouth at bedtime.     . polyethylene glycol (MIRALAX / GLYCOLAX) packet Take 17 g by mouth daily. (Patient taking differently: Take 17 g daily as needed by mouth. ) 14 each 0    Results for orders placed or performed during the hospital encounter of 09/21/17 (from the past 48 hour(s))  CBC     Status: Abnormal   Collection Time: 09/21/17 10:11 PM  Result Value Ref Range   WBC 6.3 3.8 - 10.6 K/uL   RBC 4.91 4.40 - 5.90 MIL/uL   Hemoglobin 13.0 13.0 - 18.0 g/dL   HCT 40.9 40.0 - 52.0 %   MCV 83.2 80.0 - 100.0 fL   MCH 26.4 26.0 - 34.0 pg   MCHC 31.7 (L) 32.0 - 36.0 g/dL   RDW 23.7 (H) 11.5 - 14.5 %   Platelets 184 150 - 440 K/uL    Comment: Performed at [facility], [address] [place], [place] [address]  Comprehensive metabolic panel     Status: Abnormal   Collection Time: 09/21/17 10:11 PM  Result Value Ref Range   Sodium 133 (L) 135 - 145 mmol/L   Potassium 3.8 3.5 - 5.1 mmol/L   Chloride 98 (L) 101 - 111 mmol/L   CO2 28 22  - 32 mmol/L   Glucose, Bld 115 (H) 65 - 99 mg/dL   BUN 28 (H) 6 - 20 mg/dL   Creatinine, Ser 1.48 (H) 0.61 - 1.24 mg/dL   Calcium 9.2 8.9 - 10.3 mg/dL   Total Protein 7.5 6.5 - 8.1 g/dL   Albumin 3.5 3.5 - 5.0 g/dL   AST 43 (H) 15 - 41 U/L   ALT 44 17 - 63 U/L   Alkaline Phosphatase 189 (H) 38 - 126 U/L   Total Bilirubin 0.7 0.3 - 1.2 mg/dL   GFR calc non Af Amer 50 (L) >60 mL/min   GFR calc Af Amer 58 (L) >60 mL/min    Comment: (NOTE) The eGFR has been calculated using the CKD EPI equation. This calculation has not been validated in all clinical situations. eGFR's persistently <60 mL/min signify possible Chronic Kidney Disease.    Anion gap 7 5 - 15    Comment: Performed at [facility], [address] [place], [place] [address]  Troponin I     Status: Abnormal   Collection Time: 09/21/17 10:11 PM  Result Value Ref Range   Troponin I 0.03 (HH) <0.03 ng/mL    Comment: CRITICAL RESULT CALLED TO, READ BACK BY AND VERIFIED WITH RACHEL HAYDEN AT 2256 09/21/17.PMH Performed at [facility], [address] [place], [place] [address]   Phenytoin level, total     Status: Abnormal   Collection Time: 09/21/17 11:55 PM  Result Value Ref Range   Phenytoin Lvl 26.6 (H) 10.0 - 20.0 ug/mL    Comment: Performed at [facility], [address] [place], [place] [address]  Urinalysis, Complete w Microscopic     Status: Abnormal   Collection Time: 09/21/17 11:55 PM  Result Value Ref Range   Color, Urine YELLOW (A) YELLOW   APPearance CLEAR (A) CLEAR   Specific Gravity, Urine 1.019 1.005 - 1.030   pH 5.0 5.0 - 8.0   Glucose, UA NEGATIVE NEGATIVE mg/dL   Hgb urine dipstick SMALL (A) NEGATIVE   Bilirubin Urine NEGATIVE NEGATIVE   Ketones, ur NEGATIVE NEGATIVE mg/dL   Protein, ur NEGATIVE NEGATIVE mg/dL   Nitrite NEGATIVE NEGATIVE   Leukocytes, UA TRACE (A) NEGATIVE   RBC / HPF 0-5 0 - 5 RBC/hpf   WBC, UA 6-30 0 - 5 WBC/hpf   Bacteria, UA NONE SEEN NONE SEEN    Squamous Epithelial / LPF NONE SEEN NONE SEEN   Mucus PRESENT     Comment: Performed at [facility], [address] [place], [place] [address]  Glucose, capillary     Status: None   Collection  Time: 09/22/17  3:31 AM  Result Value Ref Range   Glucose-Capillary 93 65 - 99 mg/dL  TSH     Status: None   Collection Time: 09/22/17  5:15 AM  Result Value Ref Range   TSH 0.373 0.350 - 4.500 uIU/mL    Comment: Performed by a 3rd Generation assay with a functional sensitivity of <=0.01 uIU/mL. Performed at [facility], [address] [place], [place] [address]   Troponin I     Status: Abnormal   Collection Time: 09/22/17  5:15 AM  Result Value Ref Range   Troponin I 0.03 (HH) <0.03 ng/mL    Comment: CRITICAL VALUE NOTED. VALUE IS CONSISTENT WITH PREVIOUSLY REPORTED/CALLED VALUE.PMH Performed at [facility], [address], [place], [place] [address]    Dg Chest Portable 1 View  Result Date: 09/21/2017 CLINICAL DATA:  Acute onset of worsening leg weakness. Patient poorly responsive. EXAM: PORTABLE CHEST 1 VIEW COMPARISON:  Chest radiograph performed 09/05/2017 FINDINGS: The lungs are well-aerated. Postoperative change is noted at the right lung apex, and mild scarring is noted on the left. Large bulla are noted at the left lung apex. There is no evidence of focal opacification, pleural effusion or pneumothorax. The cardiomediastinal silhouette is within normal limits. No acute osseous abnormalities are seen. A moderate hiatal hernia is noted. IMPRESSION: 1. Chronic lung changes.  No acute cardiopulmonary process seen. 2. Moderate hiatal hernia noted. Electronically Signed   By: Garald Balding M.D.   On: 09/21/2017 22:38    Review of Systems  Unable to perform ROS: Mental status change    Blood pressure (!) 146/107, pulse 79, temperature (!) 97.5 F (36.4 C), temperature source Oral, resp. rate (!) 24, height '5\' 8"'  (1.727 m), weight 74.8 kg (164 lb 12.8  oz), SpO2 100 %. Physical Exam  Vitals reviewed. Constitutional: He appears well-developed and well-nourished. No distress.  HENT:  Head: Normocephalic and atraumatic.  Mouth/Throat: Oropharynx is clear and moist.  Eyes: Conjunctivae and EOM are normal. Pupils are equal, round, and reactive to light. No scleral icterus.  Neck: Normal range of motion. Neck supple. No JVD present. No tracheal deviation present. No thyromegaly present.  Cardiovascular: Normal rate and normal heart sounds. An irregular rhythm present. Exam reveals no gallop and no friction rub.  No murmur heard. Respiratory: Effort normal and breath sounds normal. No respiratory distress.  GI: Soft. Bowel sounds are normal. He exhibits no distension. There is no tenderness.  Genitourinary:  Genitourinary Comments: Deferred  Lymphadenopathy:    He has no cervical adenopathy.  Neurological: He is alert. No cranial nerve deficit.  Oriented to self and family  Skin: Skin is warm and dry. No rash noted. No erythema.  Psychiatric: He has a normal mood and affect. His behavior is normal.  Difficult to assess thought and judgement as the patient is usually unintelligle     Assessment/Plan Is a 61 year old male admitted for UTI. 1.  UTI: Contributing to the patient's debility and waxing and waning mental status.  Continue ceftriaxone.  Transition to oral medication prior to discharge.  Patient does not meet criteria for sepsis. 2.  Hypertension: Uncontrolled; continue hydralazine, hydrochlorothiazide and clonidine.  Consider addition of amlodipine for better control. 3.  Dysphasia: The patient reportedly has been choking at times although he does not have any weight loss denies difficulty swallowing.  Nonetheless swallow eval prior to diet placement. 4.  Weakness: PT/OT eval for rehab prior to discharge.  The family has difficulty transferring  the patient. 5.  Seizure disorder: No seizure activity with this encounter.  Continue  Dilantin 6.  Hyperlipidemia: Continue statin therapy 7.  DVT prophylaxis: Lovenox 8.  GI prophylaxis: Pantoprazole per home regimen The patient is a full code.  Time spent on admission orders and patient care approximately 45 minutes  Harrie Foreman, MD 09/22/2017, 7:31 AM

## 2017-09-22 NOTE — Evaluation (Signed)
Clinical/Bedside Swallow Evaluation Patient Details  Name: Jerry ChurnDennis Rodd MRN: [id number] Date of Birth: 11/13/1955  Today's Date: 09/22/2017 Time: SLP Start Time (ACUTE ONLY): 0915 SLP Stop Time (ACUTE ONLY): 0948 SLP Time Calculation (min) (ACUTE ONLY): 33 min  Past Medical History:  Past Medical History:  Diagnosis Date  . Hypertension   . Seizures ([facility])   . Stroke [facility])    Past Surgical History:  Past Surgical History:  Procedure Laterality Date  . blind    . CLOSED REDUCTION CRANIOFACIAL SEPARATION    . COLONOSCOPY N/A 08/02/2017   Procedure: COLONOSCOPY;  Surgeon: Pasty Spillersahiliani, Varnita B, MD;  Location: [facility] ENDOSCOPY;  Service: Endoscopy;  Laterality: N/A;  . ESOPHAGOGASTRODUODENOSCOPY (EGD) WITH PROPOFOL N/A 07/31/2017   Procedure: ESOPHAGOGASTRODUODENOSCOPY (EGD) WITH PROPOFOL;  Surgeon: Pasty Spillersahiliani, Varnita B, MD;  Location: [facility] ENDOSCOPY;  Service: Endoscopy;  Laterality: N/A;  . LUNG REMOVAL, PARTIAL Right    HPI:      Assessment / Plan / Recommendation Clinical Impression  pt presents with a moderate oral pharyngeal dysphagia characterized by poor oral mastication of solids. pt has poor vocal quality and overall difficult to understand his speech. It was stated that this is his baseline for speech production.  pt was noted to have immediate coughing with thin liquids. pt required encouragement to sit at 90 degrees for session as  he stated he was not comfortable with sitting up. Pt with no overt ssx aspiration with solids, however due to poor oral status and posture, slp recommends to remain on dys 2 diet. pt with no overt ssx with nectar thick liquids at this time.  SLP Visit Diagnosis: Dysphagia, oropharyngeal phase (R13.12)    Aspiration Risk  Moderate aspiration risk    Diet Recommendation Dysphagia 2 (Fine chop);Nectar-thick liquid   Liquid Administration via: Spoon;Cup;No straw Medication Administration: Crushed with puree Supervision: Staff to assist with self  feeding Compensations: Slow rate;Small sips/bites;Minimize environmental distractions;Follow solids with liquid Postural Changes: Seated upright at 90 degrees    Other  Recommendations Oral Care Recommendations: Oral care BID Other Recommendations: Remove water pitcher   Follow up Recommendations        Frequency and Duration min 4x/week  2 weeks       Prognosis Prognosis for Safe Diet Advancement: Fair Barriers to Reach Goals: Severity of deficits      Swallow Study   General Date of Onset: 09/21/17 Type of Study: Bedside Swallow Evaluation Diet Prior to this Study: NPO Temperature Spikes Noted: No Respiratory Status: Room air History of Recent Intubation: No Behavior/Cognition: Alert;Cooperative Oral Cavity Assessment: Within Functional Limits Oral Care Completed by SLP: No Oral Cavity - Dentition: Poor condition;Missing dentition Vision: Functional for self-feeding Self-Feeding Abilities: Needs assist Patient Positioning: Upright in bed Baseline Vocal Quality: Low vocal intensity;Aphonic Volitional Swallow: Unable to elicit    Oral/Motor/Sensory Function Overall Oral Motor/Sensory Function: Within functional limits   Ice Chips Ice chips: Not tested   Thin Liquid Thin Liquid: Impaired Presentation: Cup Pharyngeal  Phase Impairments: Wet Vocal Quality;Cough - Immediate    Nectar Thick Nectar Thick Liquid: Within functional limits Presentation: Cup   Honey Thick Honey Thick Liquid: Not tested   Puree Puree: Within functional limits Presentation: Spoon   Solid   GO   Solid: Within functional limits Presentation: Spoon    Functional Limitations: Swallowing Swallow Current Status (M0102(G8996): At least 40 percent but less than 60 percent impaired, limited or restricted Swallow Goal Status [phone](G8997): At least 1 percent but less than 20 percent impaired,  limited or restricted   Meredith PelStacie Harris Sauber 09/22/2017,10:26 AM

## 2017-09-22 NOTE — Evaluation (Signed)
Physical Therapy Evaluation Patient Details Name: Kelli ChurnDennis Basaldua MRN: [id number] DOB: 1956-04-29 Today's Date: 09/22/2017   History of Present Illness  61 yo male with onset of UTI and AMS with elevated BP, HA and elevated troponin was admitted, referred to PT.  PMHx:  HTN, seizures, stroke, blindness, esophagitis, dysphagia  Clinical Impression  Pt was able to be assisted to attempt standing mult times, progressing his efforts to partial stand with RW and significant support.  Pt lacks ROM to stand upright and does not have the strength to push through and stretch, so would benefit from SNF stay as wife will be unable to assist him alone.  Will focus on upright standing and getting up to the chair as pt is able, progression of treatment to steps if possible.    Follow Up Recommendations SNF    Equipment Recommendations  None recommended by PT    Recommendations for Other Services       Precautions / Restrictions Precautions Precautions: Fall(telemetry) Restrictions Weight Bearing Restrictions: No      Mobility  Bed Mobility Overal bed mobility: Needs Assistance Bed Mobility: Supine to Sit;Sit to Supine     Supine to sit: Mod assist Sit to supine: Mod assist;Max assist   General bed mobility comments: lifted trunk out of the bed and pivoted on bed pad, lifted legs back to bed and repositioned  Transfers Overall transfer level: Needs assistance Equipment used: Rolling walker (2 wheeled);1 person hand held assist Transfers: Sit to/from Stand Sit to Stand: Max assist;Total assist;From elevated surface(partial stand only)            Ambulation/Gait             General Gait Details: unable  Stairs            Wheelchair Mobility    Modified Rankin (Stroke Patients Only)       Balance Overall balance assessment: Needs assistance Sitting-balance support: Feet supported;Bilateral upper extremity supported Sitting balance-Leahy Scale: Fair Sitting  balance - Comments: fair once set   Standing balance support: Bilateral upper extremity supported;During functional activity Standing balance-Leahy Scale: Poor                               Pertinent Vitals/Pain Pain Assessment: No/denies pain    Home Living Family/patient expects to be discharged to:: Private residence Living Arrangements: Spouse/significant other Available Help at Discharge: Family Type of Home: House Home Access: Ramped entrance     Home Layout: One level Home Equipment: Environmental consultantWalker - 2 wheels;Wheelchair - manual Additional Comments: pt is unable to give many details and wife has stepped out when PT arrives    Prior Function Level of Independence: Needs assistance   Gait / Transfers Assistance Needed: pt has not been walking much in the last year  ADL's / Homemaking Assistance Needed: family cares for pt to assist dressing and bathing  Comments: wc level of mobility out of the house     Hand Dominance   Dominant Hand: Right    Extremity/Trunk Assessment   Upper Extremity Assessment Upper Extremity Assessment: Generalized weakness    Lower Extremity Assessment Lower Extremity Assessment: Generalized weakness    Cervical / Trunk Assessment Cervical / Trunk Assessment: Normal  Communication   Communication: Expressive difficulties;Other (comment)(low speech volume)  Cognition Arousal/Alertness: Awake/alert Behavior During Therapy: Flat affect Overall Cognitive Status: No family/caregiver present to determine baseline cognitive functioning  General Comments: pt offers limited history      General Comments      Exercises     Assessment/Plan    PT Assessment Patient needs continued PT services  PT Problem List Decreased strength;Decreased range of motion;Decreased activity tolerance;Decreased balance;Decreased mobility;Decreased coordination;Decreased cognition;Decreased knowledge of  use of DME;Decreased safety awareness;Decreased knowledge of precautions;Obesity;Decreased skin integrity;Cardiopulmonary status limiting activity       PT Treatment Interventions DME instruction;Gait training;Functional mobility training;Therapeutic activities;Therapeutic exercise;Balance training;Neuromuscular re-education;Patient/family education    PT Goals (Current goals can be found in the Care Plan section)  Acute Rehab PT Goals Patient Stated Goal: none stated PT Goal Formulation: Patient unable to participate in goal setting Time For Goal Achievement: 10/06/17 Potential to Achieve Goals: Good    Frequency Min 2X/week   Barriers to discharge Decreased caregiver support(will need 2 person assist at all times for mobility)      Co-evaluation               AM-PAC PT "6 Clicks" Daily Activity  Outcome Measure Difficulty turning over in bed (including adjusting bedclothes, sheets and blankets)?: Unable Difficulty moving from lying on back to sitting on the side of the bed? : Unable Difficulty sitting down on and standing up from a chair with arms (e.g., wheelchair, bedside commode, etc,.)?: Unable Help needed moving to and from a bed to chair (including a wheelchair)?: A Lot Help needed walking in hospital room?: Total Help needed climbing 3-5 steps with a railing? : Total 6 Click Score: 7    End of Session Equipment Utilized During Treatment: Gait belt;Oxygen Activity Tolerance: Patient limited by fatigue;Treatment limited secondary to medical complications (Comment) Patient left: in bed;with call bell/phone within reach;with nursing/sitter in room Nurse Communication: Mobility status PT Visit Diagnosis: Unsteadiness on feet (R26.81);Muscle weakness (generalized) (M62.81);Other abnormalities of gait and mobility (R26.89);Difficulty in walking, not elsewhere classified (R26.2);Adult, failure to thrive (R62.7)    Time: [id number] PT Time Calculation (min) (ACUTE ONLY):  24 min   Charges:   PT Evaluation $PT Eval Moderate Complexity: 1 Mod PT Treatments $Therapeutic Activity: 8-22 mins   PT G Codes:   PT G-Codes **NOT FOR INPATIENT CLASS** Functional Assessment Tool Used: AM-PAC 6 Clicks Basic Mobility;Clinical judgement Functional Limitation: Mobility: Walking and moving around Mobility: Walking and Moving Around Current Status (V4098(G8978): At least 80 percent but less than 100 percent impaired, limited or restricted Mobility: Walking and Moving Around Goal Status [phone](G8979): At least 20 percent but less than 40 percent impaired, limited or restricted    Ivar DrapeRuth E Lam Mccubbins 09/22/2017, 4:50 PM   Samul Dadauth Khloee Garza, PT MS Acute Rehab Dept. Number: [facility] [phone] and [facility] [phone]

## 2017-09-22 NOTE — Progress Notes (Signed)
Pharmacy Antibiotic Note  [name] is a 61 y.o. male admitted on 09/21/2017 with UTI.  Pharmacy has been consulted for ceftriaxone dosing.  Plan: Ceftriaxone 2 grams q 24 hours ordered.  Height: 5\' 8"  (172.7 cm) Weight: 140 lb (63.5 kg) IBW/kg (Calculated) : 68.4  Temp (24hrs), Avg:97.5 F (36.4 C), Min:97.5 F (36.4 C), Max:97.5 F (36.4 C)  Recent Labs  Lab 09/21/17 2211  WBC 6.3  CREATININE 1.48*    Estimated Creatinine Clearance: 47.7 mL/min (A) (by C-G formula based on SCr of 1.48 mg/dL (H)).    Allergies  Allergen Reactions  . Iodine Anaphylaxis and Itching  . Shellfish Allergy Hives and Swelling    Antimicrobials this admission: ceftriaxone  >>    >>   Dose adjustments this admission:   Microbiology results:      12/28 UA: LE(+) NO2(-)  WBC 6-30  Thank you for allowing pharmacy to be a part of this patient's care.  Renelle Stegenga S 09/22/2017 4:35 AM

## 2017-09-23 DIAGNOSIS — R7889 Finding of other specified substances, not normally found in blood: Secondary | ICD-10-CM | POA: Diagnosis not present

## 2017-09-23 DIAGNOSIS — M6281 Muscle weakness (generalized): Secondary | ICD-10-CM | POA: Diagnosis not present

## 2017-09-23 DIAGNOSIS — N3000 Acute cystitis without hematuria: Secondary | ICD-10-CM | POA: Diagnosis present

## 2017-09-23 DIAGNOSIS — E785 Hyperlipidemia, unspecified: Secondary | ICD-10-CM | POA: Diagnosis present

## 2017-09-23 DIAGNOSIS — W06XXXA Fall from bed, initial encounter: Secondary | ICD-10-CM | POA: Diagnosis not present

## 2017-09-23 DIAGNOSIS — Z7982 Long term (current) use of aspirin: Secondary | ICD-10-CM | POA: Diagnosis not present

## 2017-09-23 DIAGNOSIS — Y92230 Patient room in hospital as the place of occurrence of the external cause: Secondary | ICD-10-CM | POA: Diagnosis not present

## 2017-09-23 DIAGNOSIS — Z91013 Allergy to seafood: Secondary | ICD-10-CM | POA: Diagnosis not present

## 2017-09-23 DIAGNOSIS — G464 Cerebellar stroke syndrome: Secondary | ICD-10-CM | POA: Diagnosis not present

## 2017-09-23 DIAGNOSIS — R2681 Unsteadiness on feet: Secondary | ICD-10-CM | POA: Diagnosis not present

## 2017-09-23 DIAGNOSIS — G9340 Encephalopathy, unspecified: Secondary | ICD-10-CM | POA: Diagnosis present

## 2017-09-23 DIAGNOSIS — Z87891 Personal history of nicotine dependence: Secondary | ICD-10-CM | POA: Diagnosis not present

## 2017-09-23 DIAGNOSIS — R4702 Dysphasia: Secondary | ICD-10-CM | POA: Diagnosis present

## 2017-09-23 DIAGNOSIS — I129 Hypertensive chronic kidney disease with stage 1 through stage 4 chronic kidney disease, or unspecified chronic kidney disease: Secondary | ICD-10-CM | POA: Diagnosis present

## 2017-09-23 DIAGNOSIS — I69351 Hemiplegia and hemiparesis following cerebral infarction affecting right dominant side: Secondary | ICD-10-CM | POA: Diagnosis not present

## 2017-09-23 DIAGNOSIS — R42 Dizziness and giddiness: Secondary | ICD-10-CM | POA: Diagnosis not present

## 2017-09-23 DIAGNOSIS — K449 Diaphragmatic hernia without obstruction or gangrene: Secondary | ICD-10-CM | POA: Diagnosis present

## 2017-09-23 DIAGNOSIS — Z5189 Encounter for other specified aftercare: Secondary | ICD-10-CM | POA: Diagnosis not present

## 2017-09-23 DIAGNOSIS — R4182 Altered mental status, unspecified: Secondary | ICD-10-CM | POA: Diagnosis not present

## 2017-09-23 DIAGNOSIS — R41841 Cognitive communication deficit: Secondary | ICD-10-CM | POA: Diagnosis not present

## 2017-09-23 DIAGNOSIS — Z79899 Other long term (current) drug therapy: Secondary | ICD-10-CM | POA: Diagnosis not present

## 2017-09-23 DIAGNOSIS — R51 Headache: Secondary | ICD-10-CM | POA: Diagnosis present

## 2017-09-23 DIAGNOSIS — I10 Essential (primary) hypertension: Secondary | ICD-10-CM | POA: Diagnosis not present

## 2017-09-23 DIAGNOSIS — K5900 Constipation, unspecified: Secondary | ICD-10-CM | POA: Diagnosis present

## 2017-09-23 DIAGNOSIS — Z7401 Bed confinement status: Secondary | ICD-10-CM | POA: Diagnosis not present

## 2017-09-23 DIAGNOSIS — Z8673 Personal history of transient ischemic attack (TIA), and cerebral infarction without residual deficits: Secondary | ICD-10-CM | POA: Diagnosis not present

## 2017-09-23 DIAGNOSIS — N183 Chronic kidney disease, stage 3 (moderate): Secondary | ICD-10-CM | POA: Diagnosis present

## 2017-09-23 DIAGNOSIS — R1312 Dysphagia, oropharyngeal phase: Secondary | ICD-10-CM | POA: Diagnosis not present

## 2017-09-23 DIAGNOSIS — Z91041 Radiographic dye allergy status: Secondary | ICD-10-CM | POA: Diagnosis not present

## 2017-09-23 DIAGNOSIS — G40909 Epilepsy, unspecified, not intractable, without status epilepticus: Secondary | ICD-10-CM | POA: Diagnosis present

## 2017-09-23 DIAGNOSIS — R410 Disorientation, unspecified: Secondary | ICD-10-CM | POA: Diagnosis not present

## 2017-09-23 LAB — URINE CULTURE: CULTURE: NO GROWTH

## 2017-09-23 LAB — BASIC METABOLIC PANEL
ANION GAP: 6 (ref 5–15)
BUN: 25 mg/dL — AB (ref 6–20)
CALCIUM: 9.1 mg/dL (ref 8.9–10.3)
CO2: 27 mmol/L (ref 22–32)
CREATININE: 1.47 mg/dL — AB (ref 0.61–1.24)
Chloride: 100 mmol/L — ABNORMAL LOW (ref 101–111)
GFR calc Af Amer: 58 mL/min — ABNORMAL LOW (ref >60)
GFR calc non Af Amer: 50 mL/min — ABNORMAL LOW (ref >60)
GLUCOSE: 122 mg/dL — AB (ref 65–99)
Potassium: 3.7 mmol/L (ref 3.5–5.1)
Sodium: 133 mmol/L — ABNORMAL LOW (ref 135–145)

## 2017-09-23 LAB — PHENYTOIN LEVEL, TOTAL: Phenytoin Lvl: 18.3 ug/mL (ref 10.0–20.0)

## 2017-09-23 MED ORDER — QUETIAPINE FUMARATE 25 MG PO TABS
25.0000 mg | ORAL_TABLET | Freq: Every day | ORAL | Status: DC
  Administered 2017-09-23 – 2017-09-25 (×3): 25 mg via ORAL
  Filled 2017-09-23 (×3): qty 1

## 2017-09-23 MED ORDER — DIPHENHYDRAMINE HCL 50 MG/ML IJ SOLN
12.5000 mg | Freq: Once | INTRAMUSCULAR | Status: AC
  Administered 2017-09-24: 12.5 mg via INTRAVENOUS
  Filled 2017-09-23: qty 0.25

## 2017-09-23 MED ORDER — IPRATROPIUM-ALBUTEROL 0.5-2.5 (3) MG/3ML IN SOLN
3.0000 mL | Freq: Three times a day (TID) | RESPIRATORY_TRACT | Status: DC
  Administered 2017-09-23 – 2017-09-25 (×5): 3 mL via RESPIRATORY_TRACT
  Filled 2017-09-23 (×9): qty 3

## 2017-09-23 MED ORDER — IPRATROPIUM-ALBUTEROL 0.5-2.5 (3) MG/3ML IN SOLN: 3.0000 mL | Freq: Four times a day (QID) | RESPIRATORY_TRACT | Status: DC | PRN

## 2017-09-23 MED ORDER — AMLODIPINE BESYLATE 10 MG PO TABS
10.0000 mg | ORAL_TABLET | Freq: Every day | ORAL | Status: DC
Start: 2017-09-24 — End: 2017-09-26
  Administered 2017-09-24 – 2017-09-26 (×3): 10 mg via ORAL
  Filled 2017-09-23 (×3): qty 1

## 2017-09-23 MED ORDER — HALOPERIDOL LACTATE 5 MG/ML IJ SOLN: 1.0000 mg | Freq: Four times a day (QID) | INTRAMUSCULAR | Status: DC | PRN

## 2017-09-23 MED ORDER — AMLODIPINE BESYLATE 5 MG PO TABS
5.0000 mg | ORAL_TABLET | Freq: Once | ORAL | Status: AC
  Administered 2017-09-23: 5 mg via ORAL
  Filled 2017-09-23: qty 1

## 2017-09-23 NOTE — Progress Notes (Signed)
OT Cancellation Note  Patient Details Name: [name] MRN: [id number] DOB: [birth year]   Cancelled Treatment:     Multiple attempts to see patient, first attempt patient was agitated, then MD in to see patient and last attempt patient was being changed and fed.  Will continue attempts to see patient for OT evaluation.  Sue Mcalexander T Brentin Shin, OTR/L, CLT   [name] 09/23/2017, 1:09 PM

## 2017-09-23 NOTE — Plan of Care (Signed)
  Progressing Education: Knowledge of General Education information will improve 09/23/2017 1416 - Progressing by Donnel SaxonKennedy, Saliou Barnier L, RN Health Behavior/Discharge Planning: Ability to manage health-related needs will improve 09/23/2017 1416 - Progressing by Donnel SaxonKennedy, Mailee Klaas L, RN Clinical Measurements: Ability to maintain clinical measurements within normal limits will improve 09/23/2017 1416 - Progressing by Donnel SaxonKennedy, Elleanor Guyett L, RN Will remain free from infection 09/23/2017 1416 - Progressing by Donnel SaxonKennedy, Oniel Meleski L, RN Diagnostic test results will improve 09/23/2017 1416 - Progressing by Donnel SaxonKennedy, Chrisanna Mishra L, RN Respiratory complications will improve 09/23/2017 1416 - Progressing by Donnel SaxonKennedy, Draylon Mercadel L, RN Cardiovascular complication will be avoided 09/23/2017 1416 - Progressing by Donnel SaxonKennedy, Neila Teem L, RN Activity: Risk for activity intolerance will decrease 09/23/2017 1416 - Progressing by Donnel SaxonKennedy, Yee Joss L, RN Nutrition: Adequate nutrition will be maintained 09/23/2017 1416 - Progressing by Donnel SaxonKennedy, Hipolito Martinezlopez L, RN Coping: Level of anxiety will decrease 09/23/2017 1416 - Progressing by Donnel SaxonKennedy, Tracie Dore L, RN Elimination: Will not experience complications related to bowel motility 09/23/2017 1416 - Progressing by Donnel SaxonKennedy, Armenta Erskin L, RN Will not experience complications related to urinary retention 09/23/2017 1416 - Progressing by Donnel SaxonKennedy, Jackqueline Aquilar L, RN Pain Managment: General experience of comfort will improve 09/23/2017 1416 - Progressing by Donnel SaxonKennedy, Nikitta Sobiech L, RN Safety: Ability to remain free from injury will improve 09/23/2017 1416 - Progressing by Donnel SaxonKennedy, Teleshia Lemere L, RN Skin Integrity: Risk for impaired skin integrity will decrease 09/23/2017 1416 - Progressing by Donnel SaxonKennedy, Martina Brodbeck L, RN

## 2017-09-23 NOTE — Clinical Social Work Note (Signed)
Clinical Social Work Assessment  Patient Details  Name: [name] MRN: [id number] Date of Birth: 12/24/1955  Date of referral:  09/23/17               Reason for consult:  Facility Placement                Permission sought to share information with:  Facility Contact Representative Permission granted to share information::  Yes, Verbal Permission Granted  Name::        Agency::     Relationship::     Contact Information:     Housing/Transportation Living arrangements for the past 2 months:  Single Family Home Source of Information:  Medical Team, Adult Children, Spouse Patient Interpreter Needed:  None Criminal Activity/Legal Involvement Pertinent to Current Situation/Hospitalization:  No - Comment as needed Significant Relationships:  Adult Children, Community Support, Spouse Lives with:  Spouse Do you feel safe going back to the place where you live?  Yes Need for family participation in patient care:  Yes (Comment)(Patient is non-verbal)  Care giving concerns:  PT recommendation for STR   Social Worker assessment / plan:  CSW met with the patient, his daughter, and his spouse at bedside to discuss discharge planning. The patient's family gave permission for the referral and indicated [facility] as the preference due to the patient's uncle working at the facility. 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  The patient will discharge when stable. The CSW has sent the referral and will follow up with bed offers as they become available.  Employment status:  Retired Insurance information:  Medicare, Medicaid In State PT Recommendations:  Skilled Nursing Facility Information / Referral to community resources:  Skilled Nursing Facility  Patient/Family's Response to care:  The patient and his family thanked the CSW.  Patient/Family's Understanding of and Emotional Response to Diagnosis, Current Treatment, and Prognosis:  The patient and his family  understand the discharge plan and are in agreement with STR.  Emotional Assessment Appearance:  Appears older than stated age Attitude/Demeanor/Rapport:  Lethargic Affect (typically observed):  Accepting Orientation:  Oriented to Self, Oriented to Place, Oriented to Situation Alcohol / Substance use:  Never Used Psych involvement (Current and /or in the community):  No (Comment)  Discharge Needs  Concerns to be addressed:  Care Coordination, Discharge Planning Concerns Readmission within the last 30 days:  Yes Current discharge risk:  Chronically ill Barriers to Discharge:  Continued Medical Work up    M , LCSW 09/23/2017, 2:21 PM  

## 2017-09-23 NOTE — NC FL2 (Signed)
MEDICAID FL2 LEVEL OF CARE SCREENING TOOL     IDENTIFICATION  Patient Name: [name] Birthdate: 27-May-1956 Sex: male Admission Date (Current Location): 09/21/2017  [place]  [name] [id number] Q Facility and Address:  [facility], [address], [place], [place] [address]      Provider Number: [id number]  Attending Physician Name and Address:  Alford HighlandWieting, Richard, MD  Relative Name and Phone Number:  Lindell NoeJustine Kotch [facility](Spouse) [phone]    Current Level of Care: Hospital Recommended Level of Care: Skilled Nursing Facility Prior Approval Number:    Date Approved/Denied: 09/23/17 PASRR Number: [id number]  Discharge Plan: SNF    Current Diagnoses: Patient Active Problem List   Diagnosis Date Noted  . Acute encephalopathy 09/23/2017  . Weakness 09/22/2017  . Dilantin toxicity 09/13/2017  . Anemia 07/29/2017  . Seizure (HCC) 04/12/2017  . Hypertensive urgency 02/14/2017    Orientation RESPIRATION BLADDER Height & Weight     Self, Time, Situation, Place  Normal Incontinent Weight: 173 lb 1.6 oz (78.5 kg) Height:  5\' 8"  (172.7 cm)  BEHAVIORAL SYMPTOMS/MOOD NEUROLOGICAL BOWEL NUTRITION STATUS    Convulsions/Seizures Incontinent Diet(Dysphagia 2, Nectar Thick liquids)  AMBULATORY STATUS COMMUNICATION OF NEEDS Skin   Extensive Assist Verbally Normal                       Personal Care Assistance Level of Assistance  Bathing, Feeding, Dressing Bathing Assistance: Limited assistance Feeding assistance: Independent Dressing Assistance: Limited assistance     Functional Limitations Info  Speech  Sight  Legally Blind in both eyes   Speech Info: Impaired    SPECIAL CARE FACTORS FREQUENCY  PT (By licensed PT)     PT Frequency: 5/week              Contractures Contractures Info: Not present    Additional Factors Info  Code Status, Allergies, Psychotropic Code Status Info: Full Allergies Info:   Iodine, Shellfish Allergy Psychotropic Info: Seroquel, Vistaril         Current Medications (09/23/2017):  This is the current hospital active medication list Current Facility-Administered Medications  Medication Dose Route Frequency Provider Last Rate Last Dose  . acetaminophen (TYLENOL) tablet 650 mg  650 mg Oral Q6H PRN Arnaldo Nataliamond, Michael S, MD       Or  . acetaminophen (TYLENOL) suppository 650 mg  650 mg Rectal Q6H PRN Arnaldo Nataliamond, Michael S, MD      . amLODipine (NORVASC) tablet 5 mg  5 mg Oral Daily Alford HighlandWieting, Richard, MD   5 mg at 09/23/17 0858  . aspirin EC tablet 81 mg  81 mg Oral Daily Arnaldo Nataliamond, Michael S, MD   81 mg at 09/23/17 [id number]  . atorvastatin (LIPITOR) tablet 40 mg  40 mg Oral Daily Arnaldo Nataliamond, Michael S, MD   40 mg at 09/23/17 [id number]  . cefTRIAXone (ROCEPHIN) 2 g in dextrose 5 % 50 mL IVPB  2 g Intravenous Q24H Arnaldo Nataliamond, Michael S, MD   Stopped at 09/23/17 1158  . cloNIDine (CATAPRES) tablet 0.2 mg  0.2 mg Oral BID Arnaldo Nataliamond, Michael S, MD   0.2 mg at 09/23/17 [id number]  . diphenhydrAMINE (BENADRYL) injection 12.5 mg  12.5 mg Intravenous Once Arnaldo Nataliamond, Michael S, MD      . docusate sodium (COLACE) capsule 100 mg  100 mg Oral BID Arnaldo Nataliamond, Michael S, MD   100 mg at 09/23/17 0858  . enoxaparin (LOVENOX) injection 40 mg  40 mg Subcutaneous Q24H Arnaldo Nataliamond, Michael S,  MD   40 mg at 09/22/17 2322  . ferrous sulfate tablet 325 mg  325 mg Oral TID WC Arnaldo Nataliamond, Michael S, MD   325 mg at 09/23/17 [id number]  . haloperidol lactate (HALDOL) injection 1 mg  1 mg Intravenous Q6H PRN Alford HighlandWieting, Richard, MD      . hydrALAZINE (APRESOLINE) tablet 100 mg  100 mg Oral TID Arnaldo Nataliamond, Michael S, MD   100 mg at 09/23/17 0858  . hydrochlorothiazide (HYDRODIURIL) tablet 25 mg  25 mg Oral Daily Arnaldo Nataliamond, Michael S, MD   25 mg at 09/23/17 [id number]  . hydrOXYzine (ATARAX/VISTARIL) tablet 25 mg  25 mg Oral Q6H PRN Arnaldo Nataliamond, Michael S, MD      . ipratropium-albuterol (DUONEB) 0.5-2.5 (3) MG/3ML nebulizer solution 3 mL  3 mL Nebulization TID  Alford HighlandWieting, Richard, MD   3 mL at 09/23/17 0756  . ipratropium-albuterol (DUONEB) 0.5-2.5 (3) MG/3ML nebulizer solution 3 mL  3 mL Nebulization Q6H PRN Wieting, Richard, MD      . ondansetron [facility](ZOFRAN) tablet 4 mg  4 mg Oral Q6H PRN Arnaldo Nataliamond, Michael S, MD       Or  . ondansetron [facility](ZOFRAN) injection 4 mg  4 mg Intravenous Q6H PRN Arnaldo Nataliamond, Michael S, MD      . pantoprazole (PROTONIX) EC tablet 40 mg  40 mg Oral Daily Arnaldo Nataliamond, Michael S, MD   40 mg at 09/23/17 0858  . phenytoin (DILANTIN) ER capsule 300 mg  300 mg Oral QHS Wieting, Richard, MD      . polyethylene glycol (MIRALAX / GLYCOLAX) packet 17 g  17 g Oral Daily PRN Arnaldo Nataliamond, Michael S, MD      . QUEtiapine (SEROQUEL) tablet 25 mg  25 mg Oral QHS Alford HighlandWieting, Richard, MD         Discharge Medications: Please see discharge summary for a list of discharge medications.  Relevant Imaging Results:  Relevant Lab Results:   Additional Information SS# [id number]  Judi CongKaren M Caedmon Louque, LCSW

## 2017-09-23 NOTE — Progress Notes (Signed)
Patient ID: [name], male   DOB: 1956/08/04, 61 y.o.   MRN: [id number]   [employer] PROGRESS NOTE  [name] JXB:[id number] DOB: 1956/08/04 DOA: 09/21/2017 PCP: Patient, No Pcp Per  HPI/Subjective: Patient more agitated today.  Patient hard to focus today but did answer a few questions.  He was able to follow some simple commands.  Did not sleep well last night.  Seemed more agitated with family.  Daughter concerned about dementia.  Objective: Vitals:   09/23/17 0853 09/23/17 1327  BP: (!) 151/100 (!) 153/97  Pulse: 80 81  Resp:    Temp: 98.9 F (37.2 C)   SpO2: 97%     Filed Weights   09/21/17 2213 09/22/17 0600 09/23/17 0552  Weight: 63.5 kg (140 lb) 74.8 kg (164 lb 12.8 oz) 78.5 kg (173 lb 1.6 oz)    ROS: Review of Systems  Unable to perform ROS: Mental acuity  Respiratory: Negative for shortness of breath.   Cardiovascular: Negative for chest pain.  Gastrointestinal: Negative for abdominal pain.   Exam: Physical Exam  Constitutional: He is oriented to person, place, and time.  HENT:  Nose: No mucosal edema.  Mouth/Throat: No oropharyngeal exudate or posterior oropharyngeal edema.  Eyes: Lids are normal.  Eyes clouded over.  Neck: No JVD present. Carotid bruit is not present. No edema present. No thyroid mass and no thyromegaly present.  Cardiovascular: S1 normal and S2 normal. Exam reveals no gallop.  No murmur heard. Pulses:      Dorsalis pedis pulses are 2+ on the right side, and 2+ on the left side.  Respiratory: No respiratory distress. He has decreased breath sounds in the right lower field and the left lower field. He has no wheezes. He has no rhonchi. He has no rales.  GI: Soft. Bowel sounds are normal. There is no tenderness.  Musculoskeletal:       Right ankle: He exhibits no swelling.       Left ankle: He exhibits no swelling.  Lymphadenopathy:    He has no cervical adenopathy.  Neurological: He is alert and oriented to person, place, and  time. No cranial nerve deficit.  Skin: Skin is warm. No rash noted. Nails show no clubbing.  Psychiatric: He has a normal mood and affect.      Data Reviewed: Basic Metabolic Panel: Recent Labs  Lab 09/21/17 2211 09/23/17 0516  NA 133* 133*  K 3.8 3.7  CL 98* 100*  CO2 28 27  GLUCOSE 115* 122*  BUN 28* 25*  CREATININE 1.48* 1.47*  CALCIUM 9.2 9.1   Liver Function Tests: Recent Labs  Lab 09/21/17 2211  AST 43*  ALT 44  ALKPHOS 189*  BILITOT 0.7  PROT 7.5  ALBUMIN 3.5   CBC: Recent Labs  Lab 09/21/17 2211  WBC 6.3  HGB 13.0  HCT 40.9  MCV 83.2  PLT 184   Cardiac Enzymes: Recent Labs  Lab 09/21/17 2211 09/22/17 0515 09/22/17 1719  TROPONINI 0.03* 0.03* 0.03*    CBG: Recent Labs  Lab 09/22/17 0331  GLUCAP 93     Studies: Dg Chest Portable 1 View  Result Date: 09/21/2017 CLINICAL DATA:  Acute onset of worsening leg weakness. Patient poorly responsive. EXAM: PORTABLE CHEST 1 VIEW COMPARISON:  Chest radiograph performed 09/05/2017 FINDINGS: The lungs are well-aerated. Postoperative change is noted at the right lung apex, and mild scarring is noted on the left. Large bulla are noted at the left lung apex. There is no evidence of focal  opacification, pleural effusion or pneumothorax. The cardiomediastinal silhouette is within normal limits. No acute osseous abnormalities are seen. A moderate hiatal hernia is noted. IMPRESSION: 1. Chronic lung changes.  No acute cardiopulmonary process seen. 2. Moderate hiatal hernia noted. Electronically Signed   By: Roanna RaiderJeffery  Chang M.D.   On: 09/21/2017 22:38    Scheduled Meds: . amLODipine  5 mg Oral Daily  . aspirin EC  81 mg Oral Daily  . atorvastatin  40 mg Oral Daily  . cloNIDine  0.2 mg Oral BID  . diphenhydrAMINE  12.5 mg Intravenous Once  . docusate sodium  100 mg Oral BID  . enoxaparin (LOVENOX) injection  40 mg Subcutaneous Q24H  . ferrous sulfate  325 mg Oral TID WC  . hydrALAZINE  100 mg Oral TID  .  hydrochlorothiazide  25 mg Oral Daily  . ipratropium-albuterol  3 mL Nebulization TID  . pantoprazole  40 mg Oral Daily  . phenytoin  300 mg Oral QHS  . QUEtiapine  25 mg Oral QHS   Continuous Infusions: . cefTRIAXone (ROCEPHIN) IVPB 2 gram/50 mL D5W (Pyxis) Stopped (09/23/17 1158)    Assessment/Plan:  1. Acute encephalopathy and agitation.  Can be secondary to not sleeping last night.  Trial of Seroquel.  Haldol if agitation.  Send off RPR and vitamin B12.  TSH close to the normal range.   2. Accelerated hypertension, headache.  Blood pressure elevated.  Patient already on hydrochlorothiazide hydralazine and clonidine.  Will increase Norvasc to 10 mg daily and give another 5 mg this afternoon 3. Acute cystitis on Rocephin.  Urine culture still pending. 4. History of stroke and right-sided weakness on aspirin and atorvastatin 5. History of esophagitis and esophageal varices on Protonix 6. History of seizure.  Dilantin level has come down.  Restart Dilantin 300 mg nightly 7. Weakness physical therapy evaluation recommended rehab 8. Chronic dysphasia.  On dysphagia 2 diet with nectar thick liquids.  Chest x-ray showing chronic changes and moderate hiatal hernia  Code Status:     Code Status Orders  (From admission, onward)        Start     Ordered   09/22/17 0431  Full code  Continuous     09/22/17 0430    Code Status History    Date Active Date Inactive Code Status Order ID Comments User Context   09/13/2017 19:08 09/15/2017 18:34 Full Code [id number]  Ramonita LabGouru, Aruna, MD Inpatient   07/29/2017 16:48 08/02/2017 21:49 Full Code [id number]  Shaune Pollackhen, Qing, MD Inpatient   04/12/2017 04:43 04/13/2017 19:57 Full Code [id number]  Ihor AustinPyreddy, Pavan, MD Inpatient   02/14/2017 03:50 02/15/2017 20:09 Full Code [id number]  Arnaldo Nataliamond, Michael S, MD ED     Family Communication: Family at bedside Disposition Plan: Likely will need rehab but needs a 3 night stay in the  hospital.  Antibiotics:  Rocephin  Time spent: 26 minutes  Kylor Valverde [employer]  [employer]

## 2017-09-24 LAB — BASIC METABOLIC PANEL
ANION GAP: 5 (ref 5–15)
BUN: 22 mg/dL — ABNORMAL HIGH (ref 6–20)
CO2: 30 mmol/L (ref 22–32)
Calcium: 9.2 mg/dL (ref 8.9–10.3)
Chloride: 98 mmol/L — ABNORMAL LOW (ref 101–111)
Creatinine, Ser: 1.47 mg/dL — ABNORMAL HIGH (ref 0.61–1.24)
GFR calc Af Amer: 58 mL/min — ABNORMAL LOW (ref >60)
GFR calc non Af Amer: 50 mL/min — ABNORMAL LOW (ref >60)
GLUCOSE: 111 mg/dL — AB (ref 65–99)
POTASSIUM: 3.9 mmol/L (ref 3.5–5.1)
Sodium: 133 mmol/L — ABNORMAL LOW (ref 135–145)

## 2017-09-24 LAB — VITAMIN B12: Vitamin B-12: 890 pg/mL (ref 180–914)

## 2017-09-24 MED ORDER — BISACODYL 5 MG PO TBEC: 5.0000 mg | DELAYED_RELEASE_TABLET | Freq: Every day | ORAL | Status: DC | PRN

## 2017-09-24 MED ORDER — TRAZODONE HCL 50 MG PO TABS
50.0000 mg | ORAL_TABLET | Freq: Every day | ORAL | Status: DC
  Administered 2017-09-24 – 2017-09-25 (×2): 50 mg via ORAL
  Filled 2017-09-24 (×2): qty 1

## 2017-09-24 MED ORDER — HYDRALAZINE HCL 50 MG PO TABS
100.0000 mg | ORAL_TABLET | Freq: Four times a day (QID) | ORAL | Status: DC
  Administered 2017-09-24 – 2017-09-26 (×5): 100 mg via ORAL
  Filled 2017-09-24 (×8): qty 2

## 2017-09-24 NOTE — Progress Notes (Signed)
OT Cancellation Note  Patient Details Name: [name] MRN: [id number] DOB: 09-Mar-1956   Cancelled Treatment:    Reason Eval/Treat Not Completed: Other (comment). On 2nd attempt, no family present, nursing with pt for pt care. Will re-attempt next date as pt/family are available.  Richrd PrimeJamie Stiller, MPH, MS, OTR/L ascom [phone] 09/24/17, 3:40 PM

## 2017-09-24 NOTE — Progress Notes (Addendum)
Patient ID: Jerry ChurnDennis Alcaraz, male   DOB: [birth year], 61 y.o.   MRN: [id number]   [employer] PROGRESS NOTE  [name] [id number] DOB: [birth year] DOA: 09/21/2017 PCP: Patient, No Pcp Per  HPI/Subjective: Patient calmer today than yesterday.  Able to answer some questions.  Patient difficult to understand.  Objective: Vitals:   09/24/17 0522 09/24/17 0925  BP: (!) 150/97 (!) 160/103  Pulse: 73   Resp: 20   Temp: 98.5 F (36.9 C) 98.6 F (37 C)  SpO2: 99% 97%    Filed Weights   09/22/17 0600 09/23/17 0552 09/24/17 0522  Weight: 74.8 kg (164 lb 12.8 oz) 78.5 kg (173 lb 1.6 oz) 75.8 kg (167 lb 3 oz)    ROS: Review of Systems  Unable to perform ROS: Mental acuity  Respiratory: Negative for shortness of breath.   Cardiovascular: Negative for chest pain.  Gastrointestinal: Positive for constipation. Negative for abdominal pain.   Exam: Physical Exam  Constitutional: He is oriented to person, place, and time.  HENT:  Nose: No mucosal edema.  Mouth/Throat: No oropharyngeal exudate or posterior oropharyngeal edema.  Eyes: Lids are normal.  Eyes clouded over.  Neck: No JVD present. Carotid bruit is not present. No edema present. No thyroid mass and no thyromegaly present.  Cardiovascular: S1 normal and S2 normal. Exam reveals no gallop.  No murmur heard. Pulses:      Dorsalis pedis pulses are 2+ on the right side, and 2+ on the left side.  Respiratory: No respiratory distress. He has decreased breath sounds in the right lower field and the left lower field. He has no wheezes. He has no rhonchi. He has no rales.  GI: Soft. Bowel sounds are normal. There is no tenderness.  Musculoskeletal:       Right ankle: He exhibits no swelling.       Left ankle: He exhibits no swelling.  Lymphadenopathy:    He has no cervical adenopathy.  Neurological: He is alert and oriented to person, place, and time. No cranial nerve deficit.  Skin: Skin is warm. Nails show no clubbing.  2  small sores on left hip likely stage II decubiti  Psychiatric: He has a normal mood and affect.      Data Reviewed: Basic Metabolic Panel: Recent Labs  Lab 09/21/17 2211 09/23/17 0516 09/24/17 0415  NA 133* 133* 133*  K 3.8 3.7 3.9  CL 98* 100* 98*  CO2 28 27 30   GLUCOSE 115* 122* 111*  BUN 28* 25* 22*  CREATININE 1.48* 1.47* 1.47*  CALCIUM 9.2 9.1 9.2   Liver Function Tests: Recent Labs  Lab 09/21/17 2211  AST 43*  ALT 44  ALKPHOS 189*  BILITOT 0.7  PROT 7.5  ALBUMIN 3.5   CBC: Recent Labs  Lab 09/21/17 2211  WBC 6.3  HGB 13.0  HCT 40.9  MCV 83.2  PLT 184   Cardiac Enzymes: Recent Labs  Lab 09/21/17 2211 09/22/17 0515 09/22/17 1719  TROPONINI 0.03* 0.03* 0.03*    CBG: Recent Labs  Lab 09/22/17 0331  GLUCAP 93    Scheduled Meds: . amLODipine  10 mg Oral Daily  . aspirin EC  81 mg Oral Daily  . atorvastatin  40 mg Oral Daily  . cloNIDine  0.2 mg Oral BID  . docusate sodium  100 mg Oral BID  . enoxaparin (LOVENOX) injection  40 mg Subcutaneous Q24H  . ferrous sulfate  325 mg Oral TID WC  . hydrALAZINE  100 mg Oral TID  .  hydrochlorothiazide  25 mg Oral Daily  . ipratropium-albuterol  3 mL Nebulization TID  . pantoprazole  40 mg Oral Daily  . phenytoin  300 mg Oral QHS  . QUEtiapine  25 mg Oral QHS    Assessment/Plan:  1. Acute encephalopathy and agitation.  Can be secondary to not sleeping last night.  Trial of Seroquel.  Haldol if agitation.  Trial of trazodone at night 2. Accelerated hypertension, headache.  Blood pressure elevated.  Patient already on hydrochlorothiazide hydralazine and clonidine.  Norvasc to 10 mg daily.  Increase patient's hydralazine to 4 times daily dosing. 3. Acute cystitis urine culture is negative so antibiotics will be stopped. 4. History of stroke and right-sided weakness on aspirin and atorvastatin 5. History of esophagitis and esophageal varices on Protonix 6. History of seizure.  Dilantin level has come  down.  Restart Dilantin 300 mg nightly.  Check Dilantin level again tomorrow 7. Weakness physical therapy evaluation recommended rehab 8. Chronic dysphasia.  On dysphagia 2 diet with nectar thick liquids.  Chest x-ray showing chronic changes and moderate hiatal hernia  9. constipation on MiraLAX.  PRN Dulcolax tablet 10. Chronic kidney disease stage III 11. 2 small sores on left hip.  Likely chronic decubiti.  Code Status:     Code Status Orders  (From admission, onward)        Start     Ordered   09/22/17 0431  Full code  Continuous     09/22/17 0430    Code Status History    Date Active Date Inactive Code Status Order ID Comments User Context   09/13/2017 19:08 09/15/2017 18:34 Full Code [id number]  Ramonita LabGouru, Aruna, MD Inpatient   07/29/2017 16:48 08/02/2017 21:49 Full Code [id number]  Shaune Pollackhen, Qing, MD Inpatient   04/12/2017 04:43 04/13/2017 19:57 Full Code [id number]  Ihor AustinPyreddy, Pavan, MD Inpatient   02/14/2017 03:50 02/15/2017 20:09 Full Code [id number]  Arnaldo Nataliamond, Michael S, MD ED     Family Communication: Family at bedside Disposition Plan: Likely will need rehab but needs a 3 night stay in the hospital.  Antibiotics:  Rocephin  Time spent: 25 minutes  Rawn Quiroa [employer]  [employer]

## 2017-09-24 NOTE — Progress Notes (Signed)
Speech Language Pathology Dysphagia Treatment Patient Details Name: Jerry ChurnDennis Codrington MRN: [id number] DOB: 1955/10/29 Today's Date: 09/24/2017 Time: [id number] SLP Time Calculation (min) (ACUTE ONLY): 18 min  Assessment / Plan / Recommendation Clinical Impression   pt continues to present with a moderate oral pharyngeal dysphagia characterized by poor oral mastication and anterior spillage of solids during intake. Pt was noted to have throat clear x 1 with thin liquids during use of cup. Pt has difficulty with typical breathing pattern and has a noted loud effortful swallow. Pt is functional on puree diet at this time as pt requires assistance for intake of solids and liquids due to poor vision and deconditioning. Pt speech is  difficult to understand however this is stated to be baseline. Pt had no overt ssx aspiration with dys 1 and nectar thick liquids. Pt able to shake head yes when slp questioned his toleration of solids and liquids. Continue with dys 1 with nectar diet at this time.     Diet Recommendation    Dys 1 with nectar thick   SLP Plan Continue with current plan of care   Pertinent Vitals/Pain None reported   Swallowing Goals     General Behavior/Cognition: Alert;Cooperative Patient Positioning: Upright in bed Oral care provided: N/A  Oral Cavity - Oral Hygiene     Dysphagia Treatment Treatment Methods: Skilled observation;Upgraded PO texture trial;Patient/caregiver education Patient observed directly with PO's: Yes Type of PO's observed: Dysphagia 1 (puree);Nectar-thick liquids;Thin liquids Feeding: Needs assist Liquids provided via: Teaspoon;Cup Oral Phase Signs & Symptoms: Anterior loss/spillage;Prolonged mastication Pharyngeal Phase Signs & Symptoms: Watery eyes;Immediate throat clear Type of cueing: Verbal;Tactile Amount of cueing: Minimal   GO Functional Limitations: Swallowing Swallow Current Status ([id number](G8996): At least 40 percent but less than 60 percent  impaired, limited or restricted Swallow Goal Status [phone](G8997): At least 1 percent but less than 20 percent impaired, limited or restricted   Meredith PelStacie Harris Sauber 09/24/2017, 10:52 AM

## 2017-09-24 NOTE — Progress Notes (Signed)
OT Cancellation Note  Patient Details Name: [name] MRN: [id number] DOB: 04-Jul-1956   Cancelled Treatment:    Reason Eval/Treat Not Completed: Patient declined, no reason specified. Order received, chart reviewed. Upon attempt, pt declines OT evaluation this morning, difficult to understand but indicates his family left and when asked if he wants them here for the evaluation, he nodded and said "yes". Will re-attempt at later time as pt/family are available.  Richrd PrimeJamie Stiller, MPH, MS, OTR/L ascom [phone] 09/24/17, 9:50 AM

## 2017-09-25 LAB — RPR: RPR: NONREACTIVE

## 2017-09-25 MED ORDER — LISINOPRIL 10 MG PO TABS
10.0000 mg | ORAL_TABLET | Freq: Every day | ORAL | Status: DC
  Administered 2017-09-25 – 2017-09-26 (×2): 10 mg via ORAL
  Filled 2017-09-25 (×2): qty 1

## 2017-09-25 NOTE — Progress Notes (Signed)
Patient was found on his knees on floor, bed alarm on, vital sign stable, no injury noted, no complaints of pain, MD notified, attempt to call wife but not answer, unable to leave voicemail due to mailbox full.

## 2017-09-25 NOTE — Progress Notes (Signed)
OT Cancellation Note  Patient Details Name: Jerry ChurnDennis Vollman MRN: [id number] DOB: 05-24-1956   Cancelled Treatment:    Reason Eval/Treat Not Completed: Patient declined, no reason specified. Upon attempt this morning, pt declines OT evaluation, no family present. Nursing in to perform pt care and to get vitals. Will re-attempt this afternoon as family is available to attend OT evaluation.   Richrd PrimeJamie Stiller, MPH, MS, OTR/L ascom [phone] 09/25/17, 9:30 AM

## 2017-09-25 NOTE — Progress Notes (Signed)
Patient ID: [name], male   DOB: 07-25-1956, 62 y.o.   MRN: [id number]   [facility] PROGRESS NOTE  [name] [id number] DOB: 07-25-1956 DOA: 09/21/2017 PCP: Patient, No Pcp Per  HPI/Subjective: Patient was a little agitated when I went in the room to see him.  I was able to calm him down.  I was able to show him what the call bell was and how to call the nurses.  This morning he did have a slip out of the bed.  The patient states he was trying to urinate.  The patient is difficult to understand with history of stroke.  Objective: Vitals:   09/25/17 0925 09/25/17 1300  BP: (!) 165/99 139/90  Pulse: 84 86  Resp:  18  Temp: 97.8 F (36.6 C) 98.7 F (37.1 C)  SpO2: 99% 98%    Filed Weights   09/23/17 0552 09/24/17 0522 09/25/17 0507  Weight: 78.5 kg (173 lb 1.6 oz) 75.8 kg (167 lb 3 oz) 76.4 kg (168 lb 7 oz)    ROS: Review of Systems  Unable to perform ROS: Mental acuity  Respiratory: Negative for shortness of breath.   Cardiovascular: Negative for chest pain.  Gastrointestinal: Negative for abdominal pain.   Exam: Physical Exam  Constitutional: He is oriented to person, place, and time.  HENT:  Nose: No mucosal edema.  Mouth/Throat: No oropharyngeal exudate or posterior oropharyngeal edema.  Eyes: Lids are normal.  Eyes clouded over.  Neck: No JVD present. Carotid bruit is not present. No edema present. No thyroid mass and no thyromegaly present.  Cardiovascular: S1 normal and S2 normal. Exam reveals no gallop.  No murmur heard. Pulses:      Dorsalis pedis pulses are 2+ on the right side, and 2+ on the left side.  Respiratory: No respiratory distress. He has decreased breath sounds in the right lower field and the left lower field. He has no wheezes. He has no rhonchi. He has no rales.  GI: Soft. Bowel sounds are normal. There is no tenderness.  Musculoskeletal:       Right ankle: He exhibits no swelling.       Left ankle: He exhibits no swelling.   Lymphadenopathy:    He has no cervical adenopathy.  Neurological: He is alert and oriented to person, place, and time. No cranial nerve deficit.  Skin: Skin is warm. Nails show no clubbing.  2 small sores on left hip likely stage II decubiti  Psychiatric: He has a normal mood and affect.      Data Reviewed: Basic Metabolic Panel: Recent Labs  Lab 09/21/17 2211 09/23/17 0516 09/24/17 0415  NA 133* 133* 133*  K 3.8 3.7 3.9  CL 98* 100* 98*  CO2 28 27 30   GLUCOSE 115* 122* 111*  BUN 28* 25* 22*  CREATININE 1.48* 1.47* 1.47*  CALCIUM 9.2 9.1 9.2   Liver Function Tests: Recent Labs  Lab 09/21/17 2211  AST 43*  ALT 44  ALKPHOS 189*  BILITOT 0.7  PROT 7.5  ALBUMIN 3.5   CBC: Recent Labs  Lab 09/21/17 2211  WBC 6.3  HGB 13.0  HCT 40.9  MCV 83.2  PLT 184   Cardiac Enzymes: Recent Labs  Lab 09/21/17 2211 09/22/17 0515 09/22/17 1719  TROPONINI 0.03* 0.03* 0.03*    CBG: Recent Labs  Lab 09/22/17 0331  GLUCAP 93    Scheduled Meds: . amLODipine  10 mg Oral Daily  . aspirin EC  81 mg Oral Daily  .  atorvastatin  40 mg Oral Daily  . cloNIDine  0.2 mg Oral BID  . docusate sodium  100 mg Oral BID  . enoxaparin (LOVENOX) injection  40 mg Subcutaneous Q24H  . ferrous sulfate  325 mg Oral TID WC  . hydrALAZINE  100 mg Oral QID  . hydrochlorothiazide  25 mg Oral Daily  . ipratropium-albuterol  3 mL Nebulization TID  . lisinopril  10 mg Oral Daily  . pantoprazole  40 mg Oral Daily  . phenytoin  300 mg Oral QHS  . QUEtiapine  25 mg Oral QHS  . traZODone  50 mg Oral QHS    Assessment/Plan:  1. Acute encephalopathy and agitation.  Patient was able to be reoriented today.  I was able to talk with him and calm him down.  Patient was shown how to press the call bell.  Family not in the room when I was there.  Trial of Seroquel.  Haldol if agitation.  Trial of trazodone at night. 2. Accelerated hypertension, headache.  Blood pressure little better controlled on  hydralazine, lisinopril, Norvasc, clonidine. 3. Acute cystitis- urine culture is negative so antibiotics will be stopped. 4. History of stroke and right-sided weakness on aspirin and atorvastatin 5. History of esophagitis and esophageal varices on Protonix 6. History of seizure.  Dilantin level high on presentation.  Dilantin level has come down.  Restarted Dilantin 300 mg nightly.  Dilantin level tomorrow morning. 7. Weakness physical therapy evaluation recommended rehab 8. Chronic dysphasia.  On dysphagia 2 diet with nectar thick liquids.  Chest x-ray showing chronic changes and moderate hiatal hernia  9. constipation on MiraLAX.  PRN Dulcolax tablet 10. Chronic kidney disease stage III 11. 2 small sores on left hip.  Likely chronic decubiti.  Code Status:     Code Status Orders  (From admission, onward)        Start     Ordered   09/22/17 0431  Full code  Continuous     09/22/17 0430    Code Status History    Date Active Date Inactive Code Status Order ID Comments User Context   09/13/2017 19:08 09/15/2017 18:34 Full Code [id number]  Ramonita Lab, MD Inpatient   07/29/2017 16:48 08/02/2017 21:49 Full Code [id number]  Shaune Pollack, MD Inpatient   04/12/2017 04:43 04/13/2017 19:57 Full Code [id number]  Ihor Austin, MD Inpatient   02/14/2017 03:50 02/15/2017 20:09 Full Code [id number]  [name], MD ED     Family Communication: Family yesterday Disposition Plan:  tonight would be a 3 night stay.  Out to rehab tomorrow.  Antibiotics:  Rocephin stopped  Time spent: 24 minutes  Alasia Enge [employer]

## 2017-09-26 DIAGNOSIS — R4182 Altered mental status, unspecified: Secondary | ICD-10-CM | POA: Diagnosis not present

## 2017-09-26 DIAGNOSIS — G40909 Epilepsy, unspecified, not intractable, without status epilepticus: Secondary | ICD-10-CM | POA: Diagnosis not present

## 2017-09-26 DIAGNOSIS — G9340 Encephalopathy, unspecified: Secondary | ICD-10-CM | POA: Diagnosis not present

## 2017-09-26 DIAGNOSIS — G464 Cerebellar stroke syndrome: Secondary | ICD-10-CM | POA: Diagnosis not present

## 2017-09-26 DIAGNOSIS — Z5189 Encounter for other specified aftercare: Secondary | ICD-10-CM | POA: Diagnosis not present

## 2017-09-26 DIAGNOSIS — I69322 Dysarthria following cerebral infarction: Secondary | ICD-10-CM | POA: Diagnosis not present

## 2017-09-26 DIAGNOSIS — M6281 Muscle weakness (generalized): Secondary | ICD-10-CM | POA: Diagnosis not present

## 2017-09-26 DIAGNOSIS — Z8673 Personal history of transient ischemic attack (TIA), and cerebral infarction without residual deficits: Secondary | ICD-10-CM | POA: Diagnosis not present

## 2017-09-26 DIAGNOSIS — K219 Gastro-esophageal reflux disease without esophagitis: Secondary | ICD-10-CM | POA: Diagnosis not present

## 2017-09-26 DIAGNOSIS — R2681 Unsteadiness on feet: Secondary | ICD-10-CM | POA: Diagnosis not present

## 2017-09-26 DIAGNOSIS — R41841 Cognitive communication deficit: Secondary | ICD-10-CM | POA: Diagnosis not present

## 2017-09-26 DIAGNOSIS — R451 Restlessness and agitation: Secondary | ICD-10-CM | POA: Diagnosis not present

## 2017-09-26 DIAGNOSIS — R1312 Dysphagia, oropharyngeal phase: Secondary | ICD-10-CM | POA: Diagnosis not present

## 2017-09-26 DIAGNOSIS — G4700 Insomnia, unspecified: Secondary | ICD-10-CM | POA: Diagnosis not present

## 2017-09-26 DIAGNOSIS — Z87898 Personal history of other specified conditions: Secondary | ICD-10-CM | POA: Diagnosis not present

## 2017-09-26 DIAGNOSIS — R410 Disorientation, unspecified: Secondary | ICD-10-CM | POA: Diagnosis not present

## 2017-09-26 DIAGNOSIS — K209 Esophagitis, unspecified: Secondary | ICD-10-CM | POA: Diagnosis not present

## 2017-09-26 DIAGNOSIS — R7889 Finding of other specified substances, not normally found in blood: Secondary | ICD-10-CM | POA: Diagnosis not present

## 2017-09-26 DIAGNOSIS — I10 Essential (primary) hypertension: Secondary | ICD-10-CM | POA: Diagnosis not present

## 2017-09-26 DIAGNOSIS — Z7982 Long term (current) use of aspirin: Secondary | ICD-10-CM | POA: Diagnosis not present

## 2017-09-26 DIAGNOSIS — N390 Urinary tract infection, site not specified: Secondary | ICD-10-CM | POA: Diagnosis not present

## 2017-09-26 DIAGNOSIS — Z7401 Bed confinement status: Secondary | ICD-10-CM | POA: Diagnosis not present

## 2017-09-26 LAB — CBC
HEMATOCRIT: 42 % (ref 40.0–52.0)
HEMOGLOBIN: 13.6 g/dL (ref 13.0–18.0)
MCH: 27.3 pg (ref 26.0–34.0)
MCHC: 32.3 g/dL (ref 32.0–36.0)
MCV: 84.7 fL (ref 80.0–100.0)
Platelets: 190 10*3/uL (ref 150–440)
RBC: 4.96 MIL/uL (ref 4.40–5.90)
RDW: 22.4 % — AB (ref 11.5–14.5)
WBC: 5.8 10*3/uL (ref 3.8–10.6)

## 2017-09-26 LAB — BASIC METABOLIC PANEL
ANION GAP: 8 (ref 5–15)
BUN: 32 mg/dL — AB (ref 6–20)
CO2: 27 mmol/L (ref 22–32)
Calcium: 9.2 mg/dL (ref 8.9–10.3)
Chloride: 98 mmol/L — ABNORMAL LOW (ref 101–111)
Creatinine, Ser: 1.55 mg/dL — ABNORMAL HIGH (ref 0.61–1.24)
GFR calc Af Amer: 54 mL/min — ABNORMAL LOW (ref >60)
GFR calc non Af Amer: 47 mL/min — ABNORMAL LOW (ref >60)
GLUCOSE: 111 mg/dL — AB (ref 65–99)
POTASSIUM: 3.9 mmol/L (ref 3.5–5.1)
Sodium: 133 mmol/L — ABNORMAL LOW (ref 135–145)

## 2017-09-26 LAB — PHENYTOIN LEVEL, TOTAL: Phenytoin Lvl: 11.4 ug/mL (ref 10.0–20.0)

## 2017-09-26 MED ORDER — PHENYTOIN SODIUM EXTENDED 300 MG PO CAPS: 300.0000 mg | ORAL_CAPSULE | Freq: Every day | ORAL | Status: AC

## 2017-09-26 MED ORDER — POLYETHYLENE GLYCOL 3350 17 G PO PACK: 17.0000 g | PACK | Freq: Every day | ORAL | Status: DC | PRN

## 2017-09-26 MED ORDER — QUETIAPINE FUMARATE 25 MG PO TABS: 25.0000 mg | ORAL_TABLET | Freq: Every day | ORAL | Status: AC

## 2017-09-26 MED ORDER — IPRATROPIUM-ALBUTEROL 0.5-2.5 (3) MG/3ML IN SOLN: 3.0000 mL | Freq: Four times a day (QID) | RESPIRATORY_TRACT | Status: DC | PRN

## 2017-09-26 MED ORDER — BISACODYL 10 MG RE SUPP
10.0000 mg | Freq: Once | RECTAL | Status: AC
  Administered 2017-09-26: 10 mg via RECTAL
  Filled 2017-09-26: qty 1

## 2017-09-26 MED ORDER — AMLODIPINE BESYLATE 10 MG PO TABS: 10.0000 mg | ORAL_TABLET | Freq: Every day | ORAL | Status: AC

## 2017-09-26 MED ORDER — TRAZODONE HCL 50 MG PO TABS: 50.0000 mg | ORAL_TABLET | Freq: Every day | ORAL | Status: AC

## 2017-09-26 NOTE — Plan of Care (Signed)
Pt is being d/ced to [facility], Rm 610.  Will transport via EMS.  Have scheduled pickup between 4:30-5 pm.  Called report to [facility].  Pt removed IV today.  Had large BM after receiving dulcolax suppository. B/c he's taking iron 3x/day, encouraged them to give him PRN laxatives.  Pt had no c/o of pain.  Held afternoon dose of hydralazine 100 mg b/c BP was 108/67, HR 79.  Pt's wife is at bedside and is supportive.

## 2017-09-26 NOTE — Clinical Social Work Placement (Signed)
   CLINICAL SOCIAL WORK PLACEMENT  NOTE  Date:  09/26/2017  Patient Details  Name: [name] MRN: [id number] Date of Birth: 1956-08-13  Clinical Social Work is seeking post-discharge placement for this patient at the Skilled  Nursing Facility level of care (*CSW will initial, date and re-position this form in  chart as items are completed):  Yes   Patient/family provided with [facility] Clinical Social Work Department's list of facilities offering this level of care within the geographic area requested by the patient (or if unable, by the patient's family).  Yes   Patient/family informed of their freedom to choose among providers that offer the needed level of care, that participate in Medicare, Medicaid or managed care program needed by the patient, have an available bed and are willing to accept the patient.  Yes   Patient/family informed of [facility]'s ownership interest in [facility] and [facility], as well as of the fact that they are under no obligation to receive care at these facilities.  PASRR submitted to EDS on 09/23/17     PASRR number received on 09/23/17     Existing PASRR number confirmed on       FL2 transmitted to all facilities in geographic area requested by pt/family on 09/23/17     FL2 transmitted to all facilities within larger geographic area on       Patient informed that his/her managed care company has contracts with or will negotiate with certain facilities, including the following:        Yes   Patient/family informed of bed offers received.  [facility] bed [facility]     Physician recommends and patient chooses bed at      Patient to be transferred to [facility] on 09/26/17.  Patient to be transferred to facility by [facility]     Patient family notified on 09/26/17 of transfer.  Name of family member notified:  Wife [name] who was at bedside, and daughter Jerry StarringShanese.     PHYSICIAN Please  sign FL2     Additional Comment:    _______________________________________________ Darleene CleaverAnterhaus, Karem Farha R, LCSWA 09/26/2017, 2:30 PM

## 2017-09-26 NOTE — Discharge Summary (Signed)
[facility] - [place] at [facility], [place] y.o., DOB [birth year], MRN [id number]. Admission date: 09/21/2017 Discharge Date 09/26/2017 Primary MD Patient, No Pcp Per Admitting Physician [name], MD  Admission Diagnosis  Dizziness [R42] Urinary tract infection without hematuria, site unspecified [N39.0]  Discharge Diagnosis   Active Problems: Weakness Acute encephalopathy Accelerated hypertension Elevated Dilantin level Acute cystitis Previous history of stroke with right-sided weakness History of esophagitis and esophageal varices Chronic kidney disease stage III   Hospital Course   Patient is a 62 year old with previous history of CVA, history of hypertension, epilepsy and stroke brought to the ED emergency room due to decreased responsiveness.  Patient was seen in the emergency room CT scan of the head was negative.  Urinalysis was abnormal.  And Dilantin level was slightly elevated.  Patient also was noted to have accelerated hypertension.  He was treated for these conditions and his mental status seems to be back at baseline.  Patient was seen by physical therapy who recommended rehab.  Which is currently being arranged.          Consults  None  Significant Tests:  See full reports for all details     Dg Chest 2 View  Result Date: 09/05/2017 CLINICAL DATA:  Shortness of Breath EXAM: CHEST  2 VIEW COMPARISON:  08/11/2017 FINDINGS: Cardiac shadow is stable. Postoperative changes in the right lung are seen and stable. Chronic emphysematous changes are noted on the left. No focal infiltrate is seen. A moderate-sized hiatal hernia is noted. No acute bony abnormality is seen. IMPRESSION: Chronic changes without acute abnormality. Electronically Signed   By: Alcide Clever M.D.   On: 09/05/2017 15:38   Dg Chest Portable 1 View  Result Date: 09/21/2017 CLINICAL DATA:  Acute onset of worsening leg weakness. Patient poorly responsive. EXAM:  PORTABLE CHEST 1 VIEW COMPARISON:  Chest radiograph performed 09/05/2017 FINDINGS: The lungs are well-aerated. Postoperative change is noted at the right lung apex, and mild scarring is noted on the left. Large bulla are noted at the left lung apex. There is no evidence of focal opacification, pleural effusion or pneumothorax. The cardiomediastinal silhouette is within normal limits. No acute osseous abnormalities are seen. A moderate hiatal hernia is noted. IMPRESSION: 1. Chronic lung changes.  No acute cardiopulmonary process seen. 2. Moderate hiatal hernia noted. Electronically Signed   By: Roanna Raider M.D.   On: 09/21/2017 22:38       Today   Subjective:   [name] patient able to answer some questions  Objective:   Blood pressure (!) 147/80, pulse 80, temperature 98.6 F (37 C), temperature source Oral, resp. rate 16, height 5\' 8"  (1.727 m), weight 166 lb 14.4 oz (75.7 kg), SpO2 98 %.  .  Intake/Output Summary (Last 24 hours) at 09/26/2017 1018 Last data filed at 09/26/2017 0524 Gross per 24 hour  Intake 586 ml  Output -  Net 586 ml    Exam VITAL SIGNS: Blood pressure (!) 147/80, pulse 80, temperature 98.6 F (37 C), temperature source Oral, resp. rate 16, height 5\' 8"  (1.727 m), weight 166 lb 14.4 oz (75.7 kg), SpO2 98 %.  GENERAL:  62 y.o.-year-old patient lying in the bed with no acute distress.  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 No scleral icterus.  HEENT: Head atraumatic, normocephalic. Oropharynx and nasopharynx clear.  NECK:  Supple, no jugular venous distention. No thyroid enlargement, no tenderness.  LUNGS: Normal breath sounds bilaterally, no  wheezing, rales,rhonchi or crepitation. No use of accessory muscles of respiration.  CARDIOVASCULAR: S1, S2 normal. No murmurs, rubs, or gallops.  ABDOMEN: Soft, nontender, nondistended. Bowel sounds present. No organomegaly or mass.  EXTREMITIES: No pedal edema, cyanosis, or clubbing.   NEUROLOGIC: Moving all extremity spontaneously PSYCHIATRIC: The patient is alert and oriented x 3.  SKIN: No obvious rash, lesion, or ulcer.   Data Review     CBC w Diff:  Lab Results  Component Value Date   WBC 5.8 09/26/2017   HGB 13.6 09/26/2017   HGB 17.3 10/11/2013   HCT 42.0 09/26/2017   HCT 50.1 10/11/2013   PLT 190 09/26/2017   PLT 153 10/11/2013   LYMPHOPCT 15 09/13/2017   MONOPCT 8 09/13/2017   EOSPCT 4 09/13/2017   BASOPCT 1 09/13/2017   CMP:  Lab Results  Component Value Date   NA 133 (L) 09/26/2017   NA 135 (L) 10/11/2013   K 3.9 09/26/2017   K 3.6 10/11/2013   CL 98 (L) 09/26/2017   CL 105 10/11/2013   CO2 27 09/26/2017   CO2 25 10/11/2013   BUN 32 (H) 09/26/2017   BUN 16 10/11/2013   CREATININE 1.55 (H) 09/26/2017   CREATININE 1.23 10/11/2013   PROT 7.5 09/21/2017   PROT 7.9 10/11/2013   ALBUMIN 3.5 09/21/2017   ALBUMIN 3.6 10/11/2013   BILITOT 0.7 09/21/2017   BILITOT 0.4 10/11/2013   ALKPHOS 189 (H) 09/21/2017   ALKPHOS 139 (H) 10/11/2013   AST 43 (H) 09/21/2017   AST 43 (H) 10/11/2013   ALT 44 09/21/2017   ALT 39 10/11/2013  .  Micro Results Recent Results (from the past 240 hour(s))  Urine Culture     Status: None   Collection Time: 09/21/17 11:55 PM  Result Value Ref Range Status   Specimen Description   Final    URINE, RANDOM Performed at [facility], [address] [place], [place] [address]    Special Requests   Final    NONE Performed at [facility], [address] [place], [place] [address]    Culture   Final    NO GROWTH Performed at [facility], [address] [place], [place] [address]    Report Status 09/23/2017 FINAL  Final        Code Status Orders  (From admission, onward)        Start     Ordered   09/22/17 0431  Full code  Continuous     09/22/17 0430    Code Status History    Date Active Date Inactive Code Status Order ID Comments User Context   09/13/2017 19:08  09/15/2017 18:34 Full Code [id number]  Ramonita LabGouru, Aruna, MD Inpatient   07/29/2017 16:48 08/02/2017 21:49 Full Code [id number]  Shaune Pollackhen, Qing, MD Inpatient   04/12/2017 04:43 04/13/2017 19:57 Full Code [id number]  Ihor AustinPyreddy, Pavan, MD Inpatient   02/14/2017 03:50 02/15/2017 20:09 Full Code [id number]  Arnaldo Nataliamond, Michael S, MD ED          Follow-up Information    md at snf Follow up.           Discharge Medications   Allergies as of 09/26/2017      Reactions   Iodine Anaphylaxis, Itching   Shellfish Allergy Hives, Swelling      Medication List    TAKE these medications   amLODipine 10 MG tablet Commonly known as:  NORVASC Take 1 tablet (10 mg total) by mouth daily.  Start taking on:  09/27/2017   aspirin 81 MG EC tablet Take 1 tablet (81 mg total) by mouth daily.   atorvastatin 40 MG tablet Commonly known as:  LIPITOR Take 1 tablet (40 mg total) by mouth daily at 6 PM. What changed:  when to take this   cloNIDine 0.2 MG tablet Commonly known as:  CATAPRES Take 1 tablet (0.2 mg total) by mouth 2 (two) times daily.   ferrous sulfate 325 (65 FE) MG tablet Take 1 tablet (325 mg total) 3 (three) times daily with meals by mouth.   hydrALAZINE 100 MG tablet Commonly known as:  APRESOLINE Take 100 mg by mouth 3 (three) times daily.   hydrochlorothiazide 25 MG tablet Commonly known as:  HYDRODIURIL Take 1 tablet (25 mg total) by mouth daily.   hydrOXYzine 25 MG tablet Commonly known as:  ATARAX/VISTARIL Take 1 tablet (25 mg total) by mouth every 6 (six) hours as needed for anxiety.   omeprazole 20 MG capsule Commonly known as:  PRILOSEC Take 1 capsule by mouth daily.   ondansetron 4 MG disintegrating tablet Commonly known as:  ZOFRAN ODT Take 1 tablet (4 mg total) every 8 (eight) hours as needed by mouth for nausea or vomiting.   phenytoin 300 MG ER capsule Commonly known as:  DILANTIN Take 1 capsule (300 mg total) by mouth at bedtime. What changed:    medication strength  how  much to take   polyethylene glycol packet Commonly known as:  MIRALAX / GLYCOLAX Take 17 g by mouth daily. What changed:    when to take this  reasons to take this   QUEtiapine 25 MG tablet Commonly known as:  SEROQUEL Take 1 tablet (25 mg total) by mouth at bedtime.   traZODone 50 MG tablet Commonly known as:  DESYREL Take 1 tablet (50 mg total) by mouth at bedtime.          Total Time in preparing paper work, data evaluation and todays exam - 35 minutes  Auburn Bilberry M.D on 09/26/2017 at 10:18 AM  [facility]   Office  [phone]

## 2017-09-26 NOTE — Discharge Instructions (Signed)
[facility] - [place] at [facility]  DIET:  Dysphagia 2 nector thick diet  DISCHARGE CONDITION:  Stable  ACTIVITY:  Activity as tolerated  OXYGEN:  Home Oxygen: No.   Oxygen Delivery: room air  DISCHARGE LOCATION:  nursing home    ADDITIONAL DISCHARGE INSTRUCTION:   If you experience worsening of your admission symptoms, develop shortness of breath, life threatening emergency, suicidal or homicidal thoughts you must seek medical attention immediately by calling 911 or calling your MD immediately  if symptoms less severe.  You Must read complete instructions/literature along with all the possible adverse reactions/side effects for all the Medicines you take and that have been prescribed to you. Take any new Medicines after you have completely understood and accpet all the possible adverse reactions/side effects.   Please note  You were cared for by a hospitalist during your hospital stay. If you have any questions about your discharge medications or the care you received while you were in the hospital after you are discharged, you can call the unit and asked to speak with the hospitalist on call if the hospitalist that took care of you is not available. Once you are discharged, your primary care physician will handle any further medical issues. Please note that NO REFILLS for any discharge medications will be authorized once you are discharged, as it is imperative that you return to your primary care physician (or establish a relationship with a primary care physician if you do not have one) for your aftercare needs so that they can reassess your need for medications and monitor your lab values.

## 2017-09-26 NOTE — Progress Notes (Signed)
OT Cancellation Note  Patient Details Name: Jerry ChurnDennis Patton MRN: [id number] DOB: 25-Jul-1956   Cancelled Treatment:    Reason Eval/Treat Not Completed: Other (comment). Upon attempt, pt with nursing assisting with breakfast. No family present. Will re-attempt later this morning.  Richrd PrimeJamie Stiller, MPH, MS, OTR/L ascom [phone] 09/26/17, 9:28 AM

## 2017-09-26 NOTE — Clinical Social Work Note (Addendum)
CSW provided bed offers to patient's wife and daughter, they chose [facility].  CSW contacted [facility] and they can accept patient today after 4:30pm.  Patient to be d/c'ed today to [facility] room 610. Patient and family agreeable to plans will transport via ems RN to call report [address] Nurse [phone].  CSW updated patient's wife who was at bedside.  [facility] said to have patient arrive after 4:30pm today to make sure room is ready, CSW updated bedside nurse.  Patient's wife was also given a cab voucher to get home since she does not have any other way to get back home.  Windell MouldingEric Raye Slyter, MSW, Theresia MajorsLCSWA [phone]

## 2017-09-26 NOTE — Care Management Important Message (Signed)
Important Message  Patient Details  Name: [name] MRN: [id number] Date of Birth: 10/22/1955   Medicare Important Message Given:  Yes    Gwenette GreetBrenda S Sidda Humm, RN 09/26/2017, 7:33 AM

## 2017-09-30 DIAGNOSIS — G40909 Epilepsy, unspecified, not intractable, without status epilepticus: Secondary | ICD-10-CM | POA: Diagnosis not present

## 2017-09-30 DIAGNOSIS — K219 Gastro-esophageal reflux disease without esophagitis: Secondary | ICD-10-CM | POA: Diagnosis not present

## 2017-09-30 DIAGNOSIS — I10 Essential (primary) hypertension: Secondary | ICD-10-CM | POA: Diagnosis not present

## 2017-09-30 DIAGNOSIS — Z7982 Long term (current) use of aspirin: Secondary | ICD-10-CM | POA: Diagnosis not present

## 2017-09-30 DIAGNOSIS — N390 Urinary tract infection, site not specified: Secondary | ICD-10-CM | POA: Diagnosis not present

## 2017-09-30 DIAGNOSIS — I69322 Dysarthria following cerebral infarction: Secondary | ICD-10-CM | POA: Diagnosis not present

## 2017-09-30 DIAGNOSIS — R4182 Altered mental status, unspecified: Secondary | ICD-10-CM | POA: Diagnosis not present

## 2017-10-05 DIAGNOSIS — G40909 Epilepsy, unspecified, not intractable, without status epilepticus: Secondary | ICD-10-CM | POA: Diagnosis not present

## 2017-10-05 DIAGNOSIS — K219 Gastro-esophageal reflux disease without esophagitis: Secondary | ICD-10-CM | POA: Diagnosis not present

## 2017-10-05 DIAGNOSIS — I10 Essential (primary) hypertension: Secondary | ICD-10-CM | POA: Diagnosis not present

## 2017-10-05 DIAGNOSIS — Z7982 Long term (current) use of aspirin: Secondary | ICD-10-CM | POA: Diagnosis not present

## 2017-10-05 DIAGNOSIS — Z8673 Personal history of transient ischemic attack (TIA), and cerebral infarction without residual deficits: Secondary | ICD-10-CM | POA: Diagnosis not present

## 2017-10-12 DIAGNOSIS — G4700 Insomnia, unspecified: Secondary | ICD-10-CM | POA: Diagnosis not present

## 2017-10-12 DIAGNOSIS — Z87898 Personal history of other specified conditions: Secondary | ICD-10-CM | POA: Diagnosis not present

## 2017-10-12 DIAGNOSIS — G9340 Encephalopathy, unspecified: Secondary | ICD-10-CM | POA: Diagnosis not present

## 2017-10-15 ENCOUNTER — Other Ambulatory Visit: Payer: Self-pay

## 2017-10-15 ENCOUNTER — Ambulatory Visit (INDEPENDENT_AMBULATORY_CARE_PROVIDER_SITE_OTHER): Payer: Medicare Other | Admitting: Gastroenterology

## 2017-10-15 ENCOUNTER — Encounter: Payer: Self-pay | Admitting: Gastroenterology

## 2017-10-15 VITALS — BP 149/85 | HR 76 | Wt 172.4 lb

## 2017-10-15 DIAGNOSIS — K209 Esophagitis, unspecified without bleeding: Secondary | ICD-10-CM

## 2017-10-15 NOTE — Patient Instructions (Signed)
F/u in 3 months

## 2017-10-15 NOTE — Progress Notes (Signed)
Melodie Bouillon, MD [address]  [address]  [place], [place] [address]  Main: [phone]  Fax: [phone]   Primary Care Physician: Dorothey Baseman, MD  Primary Gastroenterologist:  Dr. Melodie Bouillon  Chief Complaint  Patient presents with  . Follow-up    hospital f/u dyaphagia, colonscopy, EGD    HPI: [name] is a 62 y.o. male who was admitted to the hospital in November 2018 due to syncope at home, and was found to be acutely anemic during that presentation with no prior history of colonoscopy or EGDs and no signs of active GI bleeding during the admission.  He underwent an EGD on November 6 that showed grade D esophagitis in the distal esophagus, the etiology of his acute anemia.  An incidental isolated esophageal varix was noted, with no red wale signs present.  A colonoscopy was also performed during that admission, since patient has never had one before and since he was found to be iron deficient with ferritin of 5.  No old or new blood was present during that exam, copious amount of stool was seen in the exam was considered to not be adequate for CRC screening.  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  Denies any family history of colon cancer.  Staff from [facility] present with the patient.  They both deny any signs of active GI bleeding.  Patient denies any dysphagia or episodes of emesis.  Tolerating oral diet without dysphagia or odynophagia.  Review of his medication list shows that patient is on omeprazole 20 mg daily.  At the time of his hospital admission in November 2018, he was recommended to be on PPI twice daily.  Current Outpatient Medications  Medication Sig Dispense Refill  . amLODipine (NORVASC) 10 MG tablet Take 1 tablet (10 mg total) by mouth daily.    Marland Kitchen aspirin EC 81 MG EC tablet Take 1 tablet (81 mg total) by mouth daily.    Marland Kitchen atorvastatin (LIPITOR) 40 MG tablet Take 1 tablet (40 mg total) by mouth daily at 6 PM. (Patient  taking differently: Take 40 mg daily by mouth. ) 30 tablet 0  . cloNIDine (CATAPRES) 0.2 MG tablet Take 1 tablet (0.2 mg total) by mouth 2 (two) times daily. 60 tablet 11  . ferrous sulfate 325 (65 FE) MG tablet Take 1 tablet (325 mg total) 3 (three) times daily with meals by mouth. 90 tablet 0  . hydrALAZINE (APRESOLINE) 100 MG tablet Take 100 mg by mouth 3 (three) times daily.    . hydrochlorothiazide (HYDRODIURIL) 25 MG tablet Take 1 tablet (25 mg total) by mouth daily. 30 tablet 0  . hydrOXYzine (ATARAX/VISTARIL) 25 MG tablet Take 1 tablet (25 mg total) by mouth every 6 (six) hours as needed for anxiety. 20 tablet 0  . omeprazole (PRILOSEC) 20 MG capsule Take 1 capsule by mouth daily.    . ondansetron (ZOFRAN ODT) 4 MG disintegrating tablet Take 1 tablet (4 mg total) every 8 (eight) hours as needed by mouth for nausea or vomiting. 20 tablet 0  . phenytoin (DILANTIN) 300 MG ER capsule Take 1 capsule (300 mg total) by mouth at bedtime.    . polyethylene glycol (MIRALAX / GLYCOLAX) packet Take 17 g by mouth daily. (Patient taking differently: Take 17 g daily as needed by mouth. ) 14 each 0  . QUEtiapine (SEROQUEL) 25 MG tablet Take 1 tablet (25 mg total) by mouth at bedtime.    . traZODone (DESYREL) 50 MG tablet  Take 1 tablet (50 mg total) by mouth at bedtime.     No current facility-administered medications for this visit.     Allergies as of 10/15/2017 - Review Complete 10/15/2017  Allergen Reaction Noted  . Iodine Anaphylaxis and Itching 04/16/2017  . Shellfish allergy Hives and Swelling 02/13/2017    ROS:  General: Negative for anorexia, weight loss, fever, chills, fatigue, weakness. ENT: Negative for hoarseness, difficulty swallowing , nasal congestion. CV: Negative for chest pain, angina, palpitations, dyspnea on exertion, peripheral edema.  Respiratory: Negative for dyspnea at rest, dyspnea on exertion, cough, sputum, wheezing.  GI: See history of present illness. GU:  Negative  for dysuria, hematuria, urinary incontinence, urinary frequency, nocturnal urination.  Endo: Negative for unusual weight change.    Physical Examination:   BP (!) 149/85   Pulse 76   Wt 172 lb 6.4 oz (78.2 kg)   BMI 26.21 kg/m   General: Well-nourished, well-developed in no acute distress.  Eyes: No icterus. Conjunctivae pink. Mouth: Oropharyngeal mucosa moist and pink , no lesions erythema or exudate. Lungs: Clear to auscultation bilaterally. Non-labored. Heart: Regular rate and rhythm, no murmurs rubs or gallops.  Abdomen: Bowel sounds are normal, nontender, nondistended, no hepatosplenomegaly or masses, no abdominal bruits or hernia , no rebound or guarding.   Extremities: No lower extremity edema. No clubbing or deformities. Neuro: Alert and oriented x 3.  Grossly intact. Skin: Warm and dry, no jaundice.   Psych: Alert and cooperative, normal mood and affect.   Labs: CMP     Component Value Date/Time   NA 133 (L) 09/26/2017 0433   NA 135 (L) 10/11/2013 1047   K 3.9 09/26/2017 0433   K 3.6 10/11/2013 1047   CL 98 (L) 09/26/2017 0433   CL 105 10/11/2013 1047   CO2 27 09/26/2017 0433   CO2 25 10/11/2013 1047   GLUCOSE 111 (H) 09/26/2017 0433   GLUCOSE 119 (H) 10/11/2013 1047   BUN 32 (H) 09/26/2017 0433   BUN 16 10/11/2013 1047   CREATININE 1.55 (H) 09/26/2017 0433   CREATININE 1.23 10/11/2013 1047   CALCIUM 9.2 09/26/2017 0433   CALCIUM 9.0 10/11/2013 1047   PROT 7.5 09/21/2017 2211   PROT 7.9 10/11/2013 1047   ALBUMIN 3.5 09/21/2017 2211   ALBUMIN 3.6 10/11/2013 1047   AST 43 (H) 09/21/2017 2211   AST 43 (H) 10/11/2013 1047   ALT 44 09/21/2017 2211   ALT 39 10/11/2013 1047   ALKPHOS 189 (H) 09/21/2017 2211   ALKPHOS 139 (H) 10/11/2013 1047   BILITOT 0.7 09/21/2017 2211   BILITOT 0.4 10/11/2013 1047   GFRNONAA 47 (L) 09/26/2017 0433   GFRNONAA >60 10/11/2013 1047   GFRAA 54 (L) 09/26/2017 0433   GFRAA >60 10/11/2013 1047   Lab Results  Component Value  Date   WBC 5.8 09/26/2017   HGB 13.6 09/26/2017   HCT 42.0 09/26/2017   MCV 84.7 09/26/2017   PLT 190 09/26/2017    Imaging Studies: Dg Chest Portable 1 View  Result Date: 09/21/2017 CLINICAL DATA:  Acute onset of worsening leg weakness. Patient poorly responsive. EXAM: PORTABLE CHEST 1 VIEW COMPARISON:  Chest radiograph performed 09/05/2017 FINDINGS: The lungs are well-aerated. Postoperative change is noted at the right lung apex, and mild scarring is noted on the left. Large bulla are noted at the left lung apex. There is no evidence of focal opacification, pleural effusion or pneumothorax. The cardiomediastinal silhouette is within normal limits. No acute osseous abnormalities are seen. A  moderate hiatal hernia is noted. IMPRESSION: 1. Chronic lung changes.  No acute cardiopulmonary process seen. 2. Moderate hiatal hernia noted. Electronically Signed   By: Roanna Raider M.D.   On: 09/21/2017 22:38    Assessment and Plan:   [name] is a 62 y.o. y/o male who presents after hospital follow-up of anemia, and a grade D esophagitis seen on EGD  Will change omeprazole to 20 mg twice daily Antireflux lifestyle measures have been recommended Elevate head of bed to 30 degrees Do not eat 3 hours before bedtime Avoid acidic foods We will repeat EGD in 6-8 weeks to assess for healing of esophagitis.  This will allow [place] to evaluate his esophagus after twice daily PPI, since he is currently on once daily PPI. Recent CBC from January 2 were reviewed and show normal hemoglobin at 13 Recent admission to the hospital at the end of December was due to altered mental status, that was attributed to elevated Dilantin level.  He was discharged when he was back to his baseline, and is alert and oriented x3 today. Follow-up in 3 months  Above instructions written on [facility] paperwork and sent with patient  The patient is alert and oriented x3 and is able to make his own decisions.  I discussed  with him that his colonoscopy was not adequate for CRC screening due to the copious amounts of stool present in his colon.  He has never had a colonoscopy before then.  I discussed the need for repeat colonoscopy for CRC screening.  Patient states he would not like to schedule this at this time, and would like to consider this in the future.  I discussed the risks of missing a malignancy if colonoscopy is not done at this time, he verbalized understanding.  [facility] staff was present during this conversation as well.  No indication for urgent colonoscopy at this time.  Can discuss this on subsequent visits.   Dr Melodie Bouillon

## 2017-10-16 ENCOUNTER — Other Ambulatory Visit: Payer: Self-pay

## 2017-10-16 DIAGNOSIS — R1310 Dysphagia, unspecified: Secondary | ICD-10-CM

## 2017-10-16 DIAGNOSIS — R451 Restlessness and agitation: Secondary | ICD-10-CM | POA: Diagnosis not present

## 2017-10-16 DIAGNOSIS — N390 Urinary tract infection, site not specified: Secondary | ICD-10-CM | POA: Diagnosis not present

## 2017-10-16 DIAGNOSIS — Z8673 Personal history of transient ischemic attack (TIA), and cerebral infarction without residual deficits: Secondary | ICD-10-CM | POA: Diagnosis not present

## 2017-10-16 DIAGNOSIS — G40909 Epilepsy, unspecified, not intractable, without status epilepticus: Secondary | ICD-10-CM | POA: Diagnosis not present

## 2017-10-16 DIAGNOSIS — Z7982 Long term (current) use of aspirin: Secondary | ICD-10-CM | POA: Diagnosis not present

## 2017-10-16 DIAGNOSIS — I10 Essential (primary) hypertension: Secondary | ICD-10-CM | POA: Diagnosis not present

## 2017-10-16 DIAGNOSIS — R4182 Altered mental status, unspecified: Secondary | ICD-10-CM | POA: Diagnosis not present

## 2017-10-16 DIAGNOSIS — K219 Gastro-esophageal reflux disease without esophagitis: Secondary | ICD-10-CM | POA: Diagnosis not present

## 2017-10-18 DIAGNOSIS — Z87898 Personal history of other specified conditions: Secondary | ICD-10-CM | POA: Diagnosis not present

## 2017-10-18 DIAGNOSIS — G4700 Insomnia, unspecified: Secondary | ICD-10-CM | POA: Diagnosis not present

## 2017-10-18 DIAGNOSIS — G9340 Encephalopathy, unspecified: Secondary | ICD-10-CM | POA: Diagnosis not present

## 2017-10-26 DIAGNOSIS — I10 Essential (primary) hypertension: Secondary | ICD-10-CM | POA: Diagnosis not present

## 2017-10-26 DIAGNOSIS — Z8673 Personal history of transient ischemic attack (TIA), and cerebral infarction without residual deficits: Secondary | ICD-10-CM | POA: Diagnosis not present

## 2017-10-26 DIAGNOSIS — K219 Gastro-esophageal reflux disease without esophagitis: Secondary | ICD-10-CM | POA: Diagnosis not present

## 2017-10-26 DIAGNOSIS — N390 Urinary tract infection, site not specified: Secondary | ICD-10-CM | POA: Diagnosis not present

## 2017-10-26 DIAGNOSIS — Z7982 Long term (current) use of aspirin: Secondary | ICD-10-CM | POA: Diagnosis not present

## 2017-10-26 DIAGNOSIS — G40909 Epilepsy, unspecified, not intractable, without status epilepticus: Secondary | ICD-10-CM | POA: Diagnosis not present

## 2017-10-31 DIAGNOSIS — G4700 Insomnia, unspecified: Secondary | ICD-10-CM | POA: Diagnosis not present

## 2017-10-31 DIAGNOSIS — Z87898 Personal history of other specified conditions: Secondary | ICD-10-CM | POA: Diagnosis not present

## 2017-10-31 DIAGNOSIS — G9340 Encephalopathy, unspecified: Secondary | ICD-10-CM | POA: Diagnosis not present

## 2017-11-08 DIAGNOSIS — F0390 Unspecified dementia without behavioral disturbance: Secondary | ICD-10-CM | POA: Diagnosis not present

## 2017-11-08 DIAGNOSIS — H548 Legal blindness, as defined in USA: Secondary | ICD-10-CM | POA: Diagnosis not present

## 2017-11-08 DIAGNOSIS — R1312 Dysphagia, oropharyngeal phase: Secondary | ICD-10-CM | POA: Diagnosis not present

## 2017-11-08 DIAGNOSIS — D509 Iron deficiency anemia, unspecified: Secondary | ICD-10-CM | POA: Diagnosis not present

## 2017-11-08 DIAGNOSIS — G40909 Epilepsy, unspecified, not intractable, without status epilepticus: Secondary | ICD-10-CM | POA: Diagnosis not present

## 2017-11-08 DIAGNOSIS — I10 Essential (primary) hypertension: Secondary | ICD-10-CM | POA: Diagnosis not present

## 2017-11-08 DIAGNOSIS — Z79899 Other long term (current) drug therapy: Secondary | ICD-10-CM | POA: Diagnosis not present

## 2017-11-08 DIAGNOSIS — M6281 Muscle weakness (generalized): Secondary | ICD-10-CM | POA: Diagnosis not present

## 2017-11-08 DIAGNOSIS — G4709 Other insomnia: Secondary | ICD-10-CM | POA: Diagnosis not present

## 2017-11-08 DIAGNOSIS — R569 Unspecified convulsions: Secondary | ICD-10-CM | POA: Diagnosis not present

## 2017-11-08 DIAGNOSIS — G464 Cerebellar stroke syndrome: Secondary | ICD-10-CM | POA: Diagnosis not present

## 2017-11-08 DIAGNOSIS — N183 Chronic kidney disease, stage 3 (moderate): Secondary | ICD-10-CM | POA: Diagnosis not present

## 2017-11-08 DIAGNOSIS — E785 Hyperlipidemia, unspecified: Secondary | ICD-10-CM | POA: Diagnosis not present

## 2017-11-08 DIAGNOSIS — R4182 Altered mental status, unspecified: Secondary | ICD-10-CM | POA: Diagnosis not present

## 2017-11-08 DIAGNOSIS — G4089 Other seizures: Secondary | ICD-10-CM | POA: Diagnosis not present

## 2017-11-09 DIAGNOSIS — I10 Essential (primary) hypertension: Secondary | ICD-10-CM | POA: Diagnosis not present

## 2017-11-09 DIAGNOSIS — G40909 Epilepsy, unspecified, not intractable, without status epilepticus: Secondary | ICD-10-CM | POA: Diagnosis not present

## 2017-11-09 DIAGNOSIS — M6281 Muscle weakness (generalized): Secondary | ICD-10-CM | POA: Diagnosis not present

## 2017-11-09 DIAGNOSIS — R569 Unspecified convulsions: Secondary | ICD-10-CM | POA: Diagnosis not present

## 2017-11-21 ENCOUNTER — Encounter: Payer: Self-pay | Admitting: Emergency Medicine

## 2017-11-22 ENCOUNTER — Ambulatory Visit: Admission: RE | Admit: 2017-11-22 | Payer: Medicare Other | Source: Ambulatory Visit | Admitting: Gastroenterology

## 2017-11-22 ENCOUNTER — Encounter: Admission: RE | Payer: Self-pay | Source: Ambulatory Visit

## 2017-11-22 ENCOUNTER — Telehealth: Payer: Self-pay | Admitting: Gastroenterology

## 2017-11-22 SURGERY — ESOPHAGOGASTRODUODENOSCOPY (EGD) WITH PROPOFOL
Anesthesia: General

## 2017-11-22 MED ORDER — PROPOFOL 500 MG/50ML IV EMUL
INTRAVENOUS | Status: AC
  Filled 2017-11-22: qty 50

## 2017-11-22 NOTE — Telephone Encounter (Signed)
[name] from [facility] calling to r/s an apt for pt please call # [phone]

## 2017-11-27 ENCOUNTER — Telehealth: Payer: Self-pay | Admitting: Gastroenterology

## 2017-11-27 NOTE — Telephone Encounter (Signed)
Tasha from [facility] calling in regards to pt to r/s his EGD that was not performed  On 11-22-17 due to [facility] not showing up. Please call to r/s

## 2017-11-28 ENCOUNTER — Other Ambulatory Visit: Payer: Self-pay

## 2017-11-28 DIAGNOSIS — R1310 Dysphagia, unspecified: Secondary | ICD-10-CM

## 2017-11-28 NOTE — Telephone Encounter (Signed)
EGD rescheduled to 12/19/17. Spoke to [employer] at [facility]. To follow same instructions that was given for 11/21/17. Morning appt needed due to altered mental status and pt to be fasting day of procedure.

## 2017-11-28 NOTE — Telephone Encounter (Signed)
Rescheduled EGD for 12/19/2017. Rodney Boozeasha at [facility] notified.

## 2017-12-10 DIAGNOSIS — R569 Unspecified convulsions: Secondary | ICD-10-CM | POA: Diagnosis not present

## 2017-12-10 DIAGNOSIS — N183 Chronic kidney disease, stage 3 (moderate): Secondary | ICD-10-CM | POA: Diagnosis not present

## 2017-12-10 DIAGNOSIS — I10 Essential (primary) hypertension: Secondary | ICD-10-CM | POA: Diagnosis not present

## 2017-12-10 DIAGNOSIS — G40909 Epilepsy, unspecified, not intractable, without status epilepticus: Secondary | ICD-10-CM | POA: Diagnosis not present

## 2017-12-18 ENCOUNTER — Encounter: Payer: Self-pay | Admitting: Emergency Medicine

## 2017-12-19 ENCOUNTER — Telehealth: Payer: Self-pay | Admitting: Gastroenterology

## 2017-12-19 ENCOUNTER — Encounter: Admission: RE | Payer: Self-pay | Source: Ambulatory Visit

## 2017-12-19 ENCOUNTER — Ambulatory Visit: Admission: RE | Admit: 2017-12-19 | Payer: Medicare Other | Source: Ambulatory Visit | Admitting: Gastroenterology

## 2017-12-19 SURGERY — ESOPHAGOGASTRODUODENOSCOPY (EGD) WITH PROPOFOL
Anesthesia: General

## 2017-12-19 NOTE — Telephone Encounter (Signed)
[name] from [facility] left vm to r/s pt procedure that he missed this morning please call [phone]

## 2018-01-08 ENCOUNTER — Other Ambulatory Visit: Payer: Self-pay

## 2018-01-08 DIAGNOSIS — R1310 Dysphagia, unspecified: Secondary | ICD-10-CM

## 2018-01-08 NOTE — Telephone Encounter (Addendum)
I spoke with Blythe Stanfordasha Holt [phone]) at [facility] and rescheduled pt's EGD (3rd time) for 01/30/2018 and f/u in 2 months (July) but will have front desk contact her for this appt so she can let family member know.

## 2018-01-14 ENCOUNTER — Ambulatory Visit: Payer: Medicare Other | Admitting: Gastroenterology

## 2018-01-14 DIAGNOSIS — N183 Chronic kidney disease, stage 3 (moderate): Secondary | ICD-10-CM | POA: Diagnosis not present

## 2018-01-14 DIAGNOSIS — I1310 Hypertensive heart and chronic kidney disease without heart failure, with stage 1 through stage 4 chronic kidney disease, or unspecified chronic kidney disease: Secondary | ICD-10-CM | POA: Diagnosis not present

## 2018-01-14 DIAGNOSIS — R569 Unspecified convulsions: Secondary | ICD-10-CM | POA: Diagnosis not present

## 2018-01-14 DIAGNOSIS — G40909 Epilepsy, unspecified, not intractable, without status epilepticus: Secondary | ICD-10-CM | POA: Diagnosis not present

## 2018-01-16 DIAGNOSIS — G4701 Insomnia due to medical condition: Secondary | ICD-10-CM | POA: Diagnosis not present

## 2018-01-29 ENCOUNTER — Encounter: Payer: Self-pay | Admitting: *Deleted

## 2018-01-30 ENCOUNTER — Ambulatory Visit: Payer: Medicare Other | Admitting: Anesthesiology

## 2018-01-30 ENCOUNTER — Other Ambulatory Visit: Payer: Self-pay

## 2018-01-30 ENCOUNTER — Encounter
Admission: RE | Disposition: A | Discharge status: Home / Self Care | Payer: Self-pay | Source: Ambulatory Visit | Attending: Gastroenterology

## 2018-01-30 ENCOUNTER — Ambulatory Visit
Admission: RE | Admit: 2018-01-30 | Discharge: 2018-01-30 | Disposition: A | Discharge status: Home / Self Care | Payer: Medicare Other | Source: Ambulatory Visit | Attending: Gastroenterology | Admitting: Gastroenterology

## 2018-01-30 DIAGNOSIS — Z87891 Personal history of nicotine dependence: Secondary | ICD-10-CM | POA: Diagnosis not present

## 2018-01-30 DIAGNOSIS — K3189 Other diseases of stomach and duodenum: Secondary | ICD-10-CM

## 2018-01-30 DIAGNOSIS — K219 Gastro-esophageal reflux disease without esophagitis: Secondary | ICD-10-CM | POA: Diagnosis not present

## 2018-01-30 DIAGNOSIS — K2210 Ulcer of esophagus without bleeding: Secondary | ICD-10-CM | POA: Insufficient documentation

## 2018-01-30 DIAGNOSIS — K2100 Gastro-esophageal reflux disease with esophagitis, without bleeding: Secondary | ICD-10-CM

## 2018-01-30 DIAGNOSIS — K449 Diaphragmatic hernia without obstruction or gangrene: Secondary | ICD-10-CM

## 2018-01-30 DIAGNOSIS — D649 Anemia, unspecified: Secondary | ICD-10-CM | POA: Diagnosis not present

## 2018-01-30 DIAGNOSIS — Z7982 Long term (current) use of aspirin: Secondary | ICD-10-CM | POA: Insufficient documentation

## 2018-01-30 DIAGNOSIS — Z8673 Personal history of transient ischemic attack (TIA), and cerebral infarction without residual deficits: Secondary | ICD-10-CM | POA: Diagnosis not present

## 2018-01-30 DIAGNOSIS — K209 Esophagitis, unspecified without bleeding: Secondary | ICD-10-CM

## 2018-01-30 DIAGNOSIS — L538 Other specified erythematous conditions: Secondary | ICD-10-CM | POA: Diagnosis not present

## 2018-01-30 DIAGNOSIS — Z79899 Other long term (current) drug therapy: Secondary | ICD-10-CM | POA: Insufficient documentation

## 2018-01-30 DIAGNOSIS — K2950 Unspecified chronic gastritis without bleeding: Secondary | ICD-10-CM | POA: Insufficient documentation

## 2018-01-30 DIAGNOSIS — K208 Other esophagitis: Secondary | ICD-10-CM | POA: Diagnosis not present

## 2018-01-30 DIAGNOSIS — R1310 Dysphagia, unspecified: Secondary | ICD-10-CM

## 2018-01-30 DIAGNOSIS — B9681 Helicobacter pylori [H. pylori] as the cause of diseases classified elsewhere: Secondary | ICD-10-CM | POA: Insufficient documentation

## 2018-01-30 DIAGNOSIS — I10 Essential (primary) hypertension: Secondary | ICD-10-CM | POA: Diagnosis not present

## 2018-01-30 DIAGNOSIS — K2960 Other gastritis without bleeding: Secondary | ICD-10-CM | POA: Diagnosis not present

## 2018-01-30 HISTORY — PX: ESOPHAGOGASTRODUODENOSCOPY (EGD) WITH PROPOFOL: SHX5813

## 2018-01-30 SURGERY — ESOPHAGOGASTRODUODENOSCOPY (EGD) WITH PROPOFOL
Anesthesia: General

## 2018-01-30 MED ORDER — PROPOFOL 10 MG/ML IV BOLUS
INTRAVENOUS | Status: DC | PRN
  Administered 2018-01-30: 50 mg via INTRAVENOUS

## 2018-01-30 MED ORDER — OMEPRAZOLE 40 MG PO CPDR: 40.0000 mg | DELAYED_RELEASE_CAPSULE | Freq: Two times a day (BID) | ORAL | 2 refills | Status: DC

## 2018-01-30 MED ORDER — PROPOFOL 500 MG/50ML IV EMUL
INTRAVENOUS | Status: DC | PRN
  Administered 2018-01-30: 140 ug/kg/min via INTRAVENOUS

## 2018-01-30 MED ORDER — SODIUM CHLORIDE 0.9 % IV SOLN
INTRAVENOUS | Status: DC
  Administered 2018-01-30: 10:00:00 via INTRAVENOUS

## 2018-01-30 MED ORDER — LIDOCAINE HCL (PF) 2 % IJ SOLN
INTRAMUSCULAR | Status: DC | PRN
  Administered 2018-01-30: 100 mg via INTRADERMAL

## 2018-01-30 NOTE — Anesthesia Postprocedure Evaluation (Signed)
Anesthesia Post Note  Patient: TEJON GRACIE  Procedure(s) Performed: ESOPHAGOGASTRODUODENOSCOPY (EGD) WITH PROPOFOL (N/A )  Patient location during evaluation: PACU Anesthesia Type: General Level of consciousness: awake and alert Pain management: pain level controlled Vital Signs Assessment: post-procedure vital signs reviewed and stable Respiratory status: spontaneous breathing, nonlabored ventilation, respiratory function stable and patient connected to nasal cannula oxygen Cardiovascular status: blood pressure returned to baseline and stable Postop Assessment: no apparent nausea or vomiting Anesthetic complications: no     Last Vitals:  Vitals:   01/30/18 1052 01/30/18 1102  BP: (!) 159/105 (!) 155/101  Pulse: 73 73  Resp: 16 15  Temp:    SpO2: 100% 100%    Last Pain:  Vitals:   01/30/18 1102  TempSrc:   PainSc: 0-No pain                 Yevette Edwards

## 2018-01-30 NOTE — Anesthesia Preprocedure Evaluation (Signed)
Anesthesia Evaluation  Patient identified by MRN, date of birth, ID band Patient awake    Reviewed: Allergy & Precautions, H&P , NPO status , Patient's Chart, lab work & pertinent test results, reviewed documented beta blocker date and time   Airway Mallampati: II   Neck ROM: full    Dental  (+) Teeth Intact   Pulmonary neg pulmonary ROS, former smoker,    Pulmonary exam normal        Cardiovascular Exercise Tolerance: Poor hypertension, On Medications negative cardio ROS Normal cardiovascular exam Rhythm:regular Rate:Normal     Neuro/Psych Seizures -, Well Controlled,  CVA negative neurological ROS  negative psych ROS   GI/Hepatic negative GI ROS, Neg liver ROS,   Endo/Other  negative endocrine ROS  Renal/GU negative Renal ROS  negative genitourinary   Musculoskeletal   Abdominal   Peds  Hematology negative hematology ROS (+) anemia ,   Anesthesia Other Findings Past Medical History: No date: Hypertension No date: Seizures (HCC) No date: Stroke [facility]) Past Surgical History: No date: blind No date: CLOSED REDUCTION CRANIOFACIAL SEPARATION 08/02/2017: COLONOSCOPY; N/A     Comment:  Procedure: COLONOSCOPY;  Surgeon: Pasty Spillers,               MD;  Location: [facility] ENDOSCOPY;  Service: Endoscopy;                Laterality: N/A; 07/31/2017: ESOPHAGOGASTRODUODENOSCOPY (EGD) WITH PROPOFOL; N/A     Comment:  Procedure: ESOPHAGOGASTRODUODENOSCOPY (EGD) WITH               PROPOFOL;  Surgeon: Pasty Spillers, MD;  Location:               [facility] ENDOSCOPY;  Service: Endoscopy;  Laterality: N/A; No date: LUNG REMOVAL, PARTIAL; Right   Reproductive/Obstetrics negative OB ROS                             Anesthesia Physical Anesthesia Plan  ASA: III  Anesthesia Plan: General   Post-op Pain Management:    Induction:   PONV Risk Score and Plan:   Airway Management Planned:    Additional Equipment:   Intra-op Plan:   Post-operative Plan:   Informed Consent: I have reviewed the patients History and Physical, chart, labs and discussed the procedure including the risks, benefits and alternatives for the proposed anesthesia with the patient or authorized representative who has indicated his/her understanding and acceptance.   Dental Advisory Given  Plan Discussed with: CRNA  Anesthesia Plan Comments:         Anesthesia Quick Evaluation

## 2018-01-30 NOTE — Anesthesia Procedure Notes (Signed)
Date/Time: 01/30/2018 10:18 AM Performed by: Junious Silk, CRNA Pre-anesthesia Checklist: Patient identified, Emergency Drugs available, Suction available, Patient being monitored and Timeout performed Oxygen Delivery Method: Nasal cannula

## 2018-01-30 NOTE — Anesthesia Post-op Follow-up Note (Signed)
Anesthesia QCDR form completed.        

## 2018-01-30 NOTE — Op Note (Signed)
[facility] Gastroenterology Patient Name: [name] Procedure Date: 01/30/2018 10:02 AM MRN: [id number] Account #: [id number] Date of Birth: Jun 24, 1956 Admit Type: Outpatient Age: 62 Room: [facility] ENDO ROOM 1 Gender: Male Note Status: Finalized Procedure:            Upper GI endoscopy Indications:          Gastro-esophageal reflux disease, Follow-up of                        gastro-esophageal reflux disease Providers:            Zailee Vallely B. Maximino Greenland MD, MD Referring MD:         Serita Sheller. Maryellen Pile MD, MD (Referring MD) Medicines:            Monitored Anesthesia Care Complications:        No immediate complications. Procedure:            Pre-Anesthesia Assessment:                       - Prior to the procedure, a History and Physical was                        performed, and patient medications, allergies and                        sensitivities were reviewed. The patient's tolerance of                        previous anesthesia was reviewed.                       - The risks and benefits of the procedure and the                        sedation options and risks were discussed with the                        patient. All questions were answered and informed                        consent was obtained.                       - Patient identification and proposed procedure were                        verified prior to the procedure by the physician, the                        nurse, the anesthesiologist, the anesthetist and the                        technician. The procedure was verified in the procedure                        room.                       - ASA Grade Assessment: III - A patient with severe  systemic disease.                       After obtaining informed consent, the endoscope was                        passed under direct vision. Throughout the procedure,                        the patient's blood pressure, pulse, and oxygen                    saturations were monitored continuously. The Endoscope                        was introduced through the mouth, and advanced to the                        second part of duodenum. The upper GI endoscopy was                        accomplished with ease. The patient tolerated the                        procedure well. Findings:      LA Grade D (one or more mucosal breaks involving at least 75% of       esophageal circumference) esophagitis with no bleeding was found 34 to       42 cm from the incisors. Biopsies were taken with a cold forceps for       histology. This was biopsied with a cold forceps for microbiology.      A 4 cm hiatal hernia was present.      Patchy mildly erythematous mucosa without bleeding was found in the       gastric antrum. Biopsies were taken with a cold forceps for histology.       Biopsies were obtained in the gastric antrum with cold forceps for       histology.      The duodenal bulb, second portion of the duodenum and examined duodenum       were normal. Impression:           - LA Grade D erosive esophagitis. Biopsied.                       - 4 cm hiatal hernia.                       - Erythematous mucosa in the antrum. Biopsied.                       - Normal duodenal bulb, second portion of the duodenum                        and examined duodenum.                       - Biopsies were obtained in the gastric antrum. Recommendation:       - Use Prilosec (omeprazole) 40 mg PO BID. This is an                        increase  in dosage due to persistent esophagitis. HSV                        and CMV testing with biopsies was ordered. If biopsies                        do not reveal any other source of esophageal findings,                        surgery referral for consideration of hiatal hernia                        surgery can be considered.                       - Follow an antireflux regimen.                       - Await pathology  results.                       - Return to my office as previously scheduled.                       - Discharge patient to home (with escort).                       - Advance diet as tolerated.                       - Continue present medications.                       - Patient has a contact number available for                        emergencies. The signs and symptoms of potential                        delayed complications were discussed with the patient.                        Return to normal activities tomorrow. Written discharge                        instructions were provided to the patient.                       - Discharge patient to home (with escort).                       - The findings and recommendations were discussed with                        the patient.                       - The findings and recommendations were discussed with                        the patient's family. Procedure Code(s):    --- Professional ---  95621, Esophagogastroduodenoscopy, flexible, transoral;                        with biopsy, single or multiple Diagnosis Code(s):    --- Professional ---                       K20.8, Other esophagitis                       K44.9, Diaphragmatic hernia without obstruction or                        gangrene                       K31.89, Other diseases of stomach and duodenum                       K21.9, Gastro-esophageal reflux disease without                        esophagitis CPT copyright 2017 American Medical Association. All rights reserved. The codes documented in this report are preliminary and upon coder review may  be revised to meet current compliance requirements.  Melodie Bouillon, MD Michel Bickers B. Maximino Greenland MD, MD 01/30/2018 10:35:20 AM This report has been signed electronically. Number of Addenda: 0 Note Initiated On: 01/30/2018 10:02 AM Estimated Blood Loss: Estimated blood loss: none.      [facility]

## 2018-01-30 NOTE — Transfer of Care (Signed)
Immediate Anesthesia Transfer of Care Note  Patient: [name]  Procedure(s) Performed: ESOPHAGOGASTRODUODENOSCOPY (EGD) WITH PROPOFOL (N/A )  Patient Location: PACU  Anesthesia Type:General  Level of Consciousness: sedated  Airway & Oxygen Therapy: Patient Spontanous Breathing and Patient connected to nasal cannula oxygen  Post-op Assessment: Report given to RN and Post -op Vital signs reviewed and stable  Post vital signs: Reviewed and stable  Last Vitals:  Vitals Value Taken Time  BP    Temp 36.1 C 01/30/2018 10:32 AM  Pulse 84 01/30/2018 10:32 AM  Resp 23 01/30/2018 10:32 AM  SpO2 100 % 01/30/2018 10:32 AM    Last Pain:  Vitals:   01/30/18 1032  TempSrc: Tympanic  PainSc: Asleep         Complications: No apparent anesthesia complications

## 2018-01-30 NOTE — H&P (Signed)
Melodie Bouillon, MD [address], [address], [place], [place], [address] [address], [address], [place], [place], [address] Phone: [phone]  Fax: [phone]  Primary Care Physician:  Derwood Kaplan, MD   Pre-Procedure History & Physical: HPI:  [name] is a 62 y.o. male is here for an EGD.   Past Medical History:  Diagnosis Date  . Hypertension   . Seizures (HCC)   . Stroke [facility])     Past Surgical History:  Procedure Laterality Date  . blind    . CLOSED REDUCTION CRANIOFACIAL SEPARATION    . COLONOSCOPY N/A 08/02/2017   Procedure: COLONOSCOPY;  Surgeon: Pasty Spillers, MD;  Location: [facility] ENDOSCOPY;  Service: Endoscopy;  Laterality: N/A;  . ESOPHAGOGASTRODUODENOSCOPY (EGD) WITH PROPOFOL N/A 07/31/2017   Procedure: ESOPHAGOGASTRODUODENOSCOPY (EGD) WITH PROPOFOL;  Surgeon: Pasty Spillers, MD;  Location: [facility] ENDOSCOPY;  Service: Endoscopy;  Laterality: N/A;  . LUNG REMOVAL, PARTIAL Right     Prior to Admission medications   Medication Sig Start Date End Date Taking? Authorizing Provider  amLODipine (NORVASC) 10 MG tablet Take 1 tablet (10 mg total) by mouth daily. 09/27/17   Auburn Bilberry, MD  aspirin EC 81 MG EC tablet Take 1 tablet (81 mg total) by mouth daily. 02/16/17   Auburn Bilberry, MD  atorvastatin (LIPITOR) 40 MG tablet Take 1 tablet (40 mg total) by mouth daily at 6 PM. Patient taking differently: Take 40 mg daily by mouth.  02/15/17   Auburn Bilberry, MD  cloNIDine (CATAPRES) 0.2 MG tablet Take 1 tablet (0.2 mg total) by mouth 2 (two) times daily. 02/15/17   Auburn Bilberry, MD  ferrous sulfate 325 (65 FE) MG tablet Take 1 tablet (325 mg total) 3 (three) times daily with meals by mouth. 08/02/17   Shaune Pollack, MD  hydrALAZINE (APRESOLINE) 100 MG tablet Take 100 mg by mouth 3 (three) times daily. 04/19/17   [provider]  hydrochlorothiazide (HYDRODIURIL) 25 MG tablet Take 1 tablet (25 mg total) by mouth daily. 04/13/17   Enedina Finner, MD   hydrOXYzine (ATARAX/VISTARIL) 25 MG tablet Take 1 tablet (25 mg total) by mouth every 6 (six) hours as needed for anxiety. 09/05/17   Sharman Cheek, MD  omeprazole (PRILOSEC) 20 MG capsule Take 20 mg by mouth 2 (two) times daily before a meal.  08/31/17   Pasty Spillers, MD  ondansetron (ZOFRAN ODT) 4 MG disintegrating tablet Take 1 tablet (4 mg total) every 8 (eight) hours as needed by mouth for nausea or vomiting. 08/11/17   Jene Every, MD  phenytoin (DILANTIN) 300 MG ER capsule Take 1 capsule (300 mg total) by mouth at bedtime. 09/26/17   Auburn Bilberry, MD  polyethylene glycol (MIRALAX / Ethelene Hal) packet Take 17 g by mouth daily. Patient taking differently: Take 17 g daily as needed by mouth.  02/15/17   Auburn Bilberry, MD  QUEtiapine (SEROQUEL) 25 MG tablet Take 1 tablet (25 mg total) by mouth at bedtime. 09/26/17   Auburn Bilberry, MD  traZODone (DESYREL) 50 MG tablet Take 1 tablet (50 mg total) by mouth at bedtime. 09/26/17   Auburn Bilberry, MD    Allergies as of 01/09/2018 - Review Complete 12/18/2017  Allergen Reaction Noted  . Iodine Anaphylaxis and Itching 04/16/2017  . Shellfish allergy Hives and Swelling 02/13/2017    Family History  Problem Relation Age of Onset  . Hypertension Mother   . CAD Mother     Social History   Socioeconomic History  . Marital  status: Married    Spouse name: Not on file  . Number of children: Not on file  . Years of education: Not on file  . Highest education level: Not on file  Occupational History  . Occupation: disabled  Social Needs  . Financial resource strain: Not hard at all  . Food insecurity:    Worry: Never true    Inability: Never true  . Transportation needs:    Medical: No    Non-medical: No  Tobacco Use  . Smoking status: Former Games developer  . Smokeless tobacco: Never Used  Substance and Sexual Activity  . Alcohol use: No  . Drug use: No  . Sexual activity: Not Currently  Lifestyle  . Physical activity:     Days per week: 0 days    Minutes per session: 0 min  . Stress: Not at all  Relationships  . Social connections:    Talks on phone: Never    Gets together: Never    Attends religious service: More than 4 times per year    Active member of club or organization: No    Attends meetings of clubs or organizations: Never    Relationship status: Married  . Intimate partner violence:    Fear of current or ex partner: Patient refused    Emotionally abused: Patient refused    Physically abused: Patient refused    Forced sexual activity: Patient refused  Other Topics Concern  . Not on file  Social History Narrative   Pt visually impaired; per family crawls around the house    Review of Systems: See HPI, otherwise negative ROS  Physical Exam: BP (!) 149/97   Pulse 85   Temp (!) 96.2 F (35.7 C)   Resp 20   SpO2 100%  General:   Alert,  pleasant and cooperative in NAD Head:  Normocephalic and atraumatic. Neck:  Supple; no masses or thyromegaly. Lungs:  Clear throughout to auscultation, normal respiratory effort.    Heart:  +S1, +S2, Regular rate and rhythm, No edema. Abdomen:  Soft, nontender and nondistended. Normal bowel sounds, without guarding, and without rebound.   Neurologic:  Alert and  oriented x4;  grossly normal neurologically.  Impression/Plan: [name] is here for an EGD for esophagitis.  Risks, benefits, limitations, and alternatives regarding the procedure have been reviewed with the patient.  Questions have been answered.  All parties agreeable.   Pasty Spillers, MD  01/30/2018, 9:44 AM

## 2018-01-31 ENCOUNTER — Encounter: Payer: Self-pay | Admitting: Gastroenterology

## 2018-02-01 LAB — SURGICAL PATHOLOGY

## 2018-02-07 ENCOUNTER — Other Ambulatory Visit: Payer: Self-pay | Admitting: Gastroenterology

## 2018-02-07 LAB — VIRUS CULTURE

## 2018-02-07 MED ORDER — BISMUTH SUBSALICYLATE 262 MG PO CHEW: 524.0000 mg | CHEWABLE_TABLET | Freq: Three times a day (TID) | ORAL | 0 refills | Status: AC

## 2018-02-07 MED ORDER — DOXYCYCLINE HYCLATE 100 MG PO CAPS: 100.0000 mg | ORAL_CAPSULE | Freq: Two times a day (BID) | ORAL | 0 refills | Status: AC

## 2018-02-07 MED ORDER — METRONIDAZOLE 250 MG PO TABS: 250.0000 mg | ORAL_TABLET | Freq: Four times a day (QID) | ORAL | 0 refills | Status: DC

## 2018-02-12 ENCOUNTER — Telehealth: Payer: Self-pay

## 2018-02-12 ENCOUNTER — Other Ambulatory Visit: Payer: Self-pay

## 2018-02-12 NOTE — Telephone Encounter (Signed)
Advised legal guardian of results:  his stomach biopsies showed H. pylori. I have prescribed quadruple therapy and sent it to the pharmacy. (Triple therapy with clarithromycin had interaction with his medications, so I will not be using that.)   Contacted pharmacy to ensure Rx taken over to [facility] facility.

## 2018-02-13 DIAGNOSIS — F0150 Vascular dementia without behavioral disturbance: Secondary | ICD-10-CM | POA: Diagnosis not present

## 2018-02-15 DIAGNOSIS — H548 Legal blindness, as defined in USA: Secondary | ICD-10-CM | POA: Diagnosis not present

## 2018-02-15 DIAGNOSIS — E785 Hyperlipidemia, unspecified: Secondary | ICD-10-CM | POA: Diagnosis not present

## 2018-02-15 DIAGNOSIS — F0390 Unspecified dementia without behavioral disturbance: Secondary | ICD-10-CM | POA: Diagnosis not present

## 2018-02-15 DIAGNOSIS — R569 Unspecified convulsions: Secondary | ICD-10-CM | POA: Diagnosis not present

## 2018-02-22 DIAGNOSIS — G4701 Insomnia due to medical condition: Secondary | ICD-10-CM | POA: Diagnosis not present

## 2018-02-22 DIAGNOSIS — F0150 Vascular dementia without behavioral disturbance: Secondary | ICD-10-CM | POA: Diagnosis not present

## 2018-03-20 DIAGNOSIS — Z79899 Other long term (current) drug therapy: Secondary | ICD-10-CM | POA: Diagnosis not present

## 2018-03-20 DIAGNOSIS — G40909 Epilepsy, unspecified, not intractable, without status epilepticus: Secondary | ICD-10-CM | POA: Diagnosis not present

## 2018-03-20 DIAGNOSIS — M6281 Muscle weakness (generalized): Secondary | ICD-10-CM | POA: Diagnosis not present

## 2018-03-20 DIAGNOSIS — D649 Anemia, unspecified: Secondary | ICD-10-CM | POA: Diagnosis not present

## 2018-03-20 DIAGNOSIS — R569 Unspecified convulsions: Secondary | ICD-10-CM | POA: Diagnosis not present

## 2018-03-20 DIAGNOSIS — I10 Essential (primary) hypertension: Secondary | ICD-10-CM | POA: Diagnosis not present

## 2018-03-20 DIAGNOSIS — F0390 Unspecified dementia without behavioral disturbance: Secondary | ICD-10-CM | POA: Diagnosis not present

## 2018-04-01 ENCOUNTER — Encounter: Payer: Self-pay | Admitting: Gastroenterology

## 2018-04-01 ENCOUNTER — Ambulatory Visit (INDEPENDENT_AMBULATORY_CARE_PROVIDER_SITE_OTHER): Payer: Medicare Other | Admitting: Gastroenterology

## 2018-04-01 VITALS — BP 128/78 | HR 81 | Wt 172.0 lb

## 2018-04-01 DIAGNOSIS — K209 Esophagitis, unspecified without bleeding: Secondary | ICD-10-CM

## 2018-04-01 DIAGNOSIS — K2970 Gastritis, unspecified, without bleeding: Secondary | ICD-10-CM | POA: Diagnosis not present

## 2018-04-01 DIAGNOSIS — K2990 Gastroduodenitis, unspecified, without bleeding: Secondary | ICD-10-CM | POA: Diagnosis not present

## 2018-04-01 NOTE — Progress Notes (Signed)
Jerry BouillonVarnita Damaso Laday, MD [address]  [address]  [place], [place] [address]  Main: [phone]  Fax: [phone]   Primary Care Physician: Jerry KaplanEason, Ernest B, MD  Primary Gastroenterologist:  Dr. Melodie BouillonVarnita Pedro Patton  Chief Complaint  Patient presents with  . Follow-up    EGD in May    HPI: [name] is a 62 y.o. male here for follow-up of grade D esophagitis.  Patient sitting in wheelchair today, and caretaker with him.  He denies any complaints.  No abdominal pain, heartburn, dysphagia, weight loss, melena, hematochezia, nausea or vomiting.  Is on Prilosec 40 mg twice daily due to persistent grade D esophagitis seen on repeat EGD in May 2019.  Stomach biopsies also showed H. pylori and he is status post quadruple therapy.  Current Outpatient Medications  Medication Sig Dispense Refill  . cloNIDine (CATAPRES) 0.2 MG tablet Take 1 tablet (0.2 mg total) by mouth 2 (two) times daily. 60 tablet 11  . ondansetron (ZOFRAN ODT) 4 MG disintegrating tablet Take 1 tablet (4 mg total) every 8 (eight) hours as needed by mouth for nausea or vomiting. 20 tablet 0  . phenytoin (DILANTIN) 300 MG ER capsule Take 1 capsule (300 mg total) by mouth at bedtime.    . polyethylene glycol (MIRALAX / GLYCOLAX) packet Take 17 g by mouth daily. (Patient taking differently: Take 17 g daily as needed by mouth. ) 14 each 0  . traZODone (DESYREL) 50 MG tablet Take 1 tablet (50 mg total) by mouth at bedtime.    Marland Kitchen. amLODipine (NORVASC) 10 MG tablet Take 1 tablet (10 mg total) by mouth daily.    Marland Kitchen. aspirin EC 81 MG EC tablet Take 1 tablet (81 mg total) by mouth daily.    Marland Kitchen. atorvastatin (LIPITOR) 40 MG tablet Take 1 tablet (40 mg total) by mouth daily at 6 PM. (Patient taking differently: Take 40 mg daily by mouth. ) 30 tablet 0  . ferrous sulfate 325 (65 FE) MG tablet Take 1 tablet (325 mg total) 3 (three) times daily with meals by mouth. 90 tablet 0  . hydrALAZINE (APRESOLINE) 100 MG tablet Take 100 mg by mouth  3 (three) times daily.    . hydrochlorothiazide (HYDRODIURIL) 25 MG tablet Take 1 tablet (25 mg total) by mouth daily. 30 tablet 0  . hydrOXYzine (ATARAX/VISTARIL) 25 MG tablet Take 1 tablet (25 mg total) by mouth every 6 (six) hours as needed for anxiety. 20 tablet 0  . metroNIDAZOLE (FLAGYL) 250 MG tablet Take 1 tablet (250 mg total) by mouth 4 (four) times daily. 30 tablet 0  . omeprazole (PRILOSEC) 40 MG capsule Take 1 capsule (40 mg total) by mouth 2 (two) times daily before a meal. 60 capsule 2  . ondansetron (ZOFRAN) 4 MG tablet     . QUEtiapine (SEROQUEL) 25 MG tablet Take 1 tablet (25 mg total) by mouth at bedtime.     No current facility-administered medications for this visit.     Allergies as of 04/01/2018 - Review Complete 04/01/2018  Allergen Reaction Noted  . Iodine Anaphylaxis and Itching 04/16/2017  . Shellfish allergy Hives and Swelling 02/13/2017    ROS:  General: Negative for anorexia, weight loss, fever, chills, fatigue, weakness. ENT: Negative for hoarseness, difficulty swallowing , nasal congestion. CV: Negative for chest pain, angina, palpitations, dyspnea on exertion, peripheral edema.  Respiratory: Negative for dyspnea at rest, dyspnea on exertion, cough, sputum, wheezing.  GI: See history of present illness. GU:  Negative for dysuria,  hematuria, urinary incontinence, urinary frequency, nocturnal urination.  Endo: Negative for unusual weight change.    Physical Examination:   BP 128/78   Pulse 81   Wt 172 lb (78 kg)   BMI 26.15 kg/m   General: Well-nourished, well-developed in no acute distress.  Eyes: Patient is blind Mouth: Oropharyngeal mucosa moist and pink , no lesions erythema or exudate. Neck: Supple, Trachea midline Abdomen: Bowel sounds are normal, nontender, nondistended, no hepatosplenomegaly or masses, no abdominal bruits or hernia , no rebound or guarding.   Extremities: No lower extremity edema. No clubbing or deformities. Neuro: Alert  and oriented x 3.  Grossly intact. Skin: Warm and dry, no jaundice.   Psych: Alert and cooperative, normal mood and affect.   Labs: CMP     Component Value Date/Time   NA 133 (L) 09/26/2017 0433   NA 135 (L) 10/11/2013 1047   K 3.9 09/26/2017 0433   K 3.6 10/11/2013 1047   CL 98 (L) 09/26/2017 0433   CL 105 10/11/2013 1047   CO2 27 09/26/2017 0433   CO2 25 10/11/2013 1047   GLUCOSE 111 (H) 09/26/2017 0433   GLUCOSE 119 (H) 10/11/2013 1047   BUN 32 (H) 09/26/2017 0433   BUN 16 10/11/2013 1047   CREATININE 1.55 (H) 09/26/2017 0433   CREATININE 1.23 10/11/2013 1047   CALCIUM 9.2 09/26/2017 0433   CALCIUM 9.0 10/11/2013 1047   PROT 7.5 09/21/2017 2211   PROT 7.9 10/11/2013 1047   ALBUMIN 3.5 09/21/2017 2211   ALBUMIN 3.6 10/11/2013 1047   AST 43 (H) 09/21/2017 2211   AST 43 (H) 10/11/2013 1047   ALT 44 09/21/2017 2211   ALT 39 10/11/2013 1047   ALKPHOS 189 (H) 09/21/2017 2211   ALKPHOS 139 (H) 10/11/2013 1047   BILITOT 0.7 09/21/2017 2211   BILITOT 0.4 10/11/2013 1047   GFRNONAA 47 (L) 09/26/2017 0433   GFRNONAA >60 10/11/2013 1047   GFRAA 54 (L) 09/26/2017 0433   GFRAA >60 10/11/2013 1047   Lab Results  Component Value Date   WBC 5.8 09/26/2017   HGB 13.6 09/26/2017   HCT 42.0 09/26/2017   MCV 84.7 09/26/2017   PLT 190 09/26/2017    Imaging Studies: No results found.  Assessment and Plan:   [name] is a 62 y.o. y/o male here for follow-up of grade D esophagitis noted on EGD, and positive H. pylori biopsies  Continue Prilosec 40 mg twice daily, as benefits of medication with persistent grade D esophagitis and in the presence of hiatal hernia outweigh risks at this time (Risks of PPI use were discussed with patient including bone loss, C. Diff diarrhea, pneumonia, infections, CKD, electrolyte abnormalities.  Pt. Verbalizes understanding and chooses to continue the medication.)  Anti-reflux lifestyle measures discussed, and written recommendations were  sent to his facility with him  Will refer to surgery to obtain their opinion on hiatal hernia surgery, given his persistent grade D esophagitis despite full dose PPI therapy  If they do not proceed with surgery, repeat EGD in 2 to 3 months to assess his esophagitis  Patient agreeable with above plan  He is asymptomatic from the viewpoint of his esophagitis at this time  Colonoscopy for colorectal cancer screening was discussed again with him, and he refuses a colonoscopy.  Written recommendations sent to the facility, for primary care provider to discuss alternative CRC screening methods with the patient, and order them as appropriate  H. pylori eradication testing can only be done by  serology, as patient needs to stay on his PPI, in which setting stool H. pylori can be false negative and thus should not be done.  Will order H. pylori serology, and written instructions were sent to facility for that as well, as they do his blood work there.  Dr Jerry Bouillon

## 2018-04-02 DIAGNOSIS — G4701 Insomnia due to medical condition: Secondary | ICD-10-CM | POA: Diagnosis not present

## 2018-04-02 DIAGNOSIS — F0150 Vascular dementia without behavioral disturbance: Secondary | ICD-10-CM | POA: Diagnosis not present

## 2018-04-30 DIAGNOSIS — G4701 Insomnia due to medical condition: Secondary | ICD-10-CM | POA: Diagnosis not present

## 2018-04-30 DIAGNOSIS — F0150 Vascular dementia without behavioral disturbance: Secondary | ICD-10-CM | POA: Diagnosis not present

## 2018-04-30 DIAGNOSIS — R569 Unspecified convulsions: Secondary | ICD-10-CM | POA: Diagnosis not present

## 2018-04-30 DIAGNOSIS — H548 Legal blindness, as defined in USA: Secondary | ICD-10-CM | POA: Diagnosis not present

## 2018-04-30 DIAGNOSIS — G40909 Epilepsy, unspecified, not intractable, without status epilepticus: Secondary | ICD-10-CM | POA: Diagnosis not present

## 2018-04-30 DIAGNOSIS — M6281 Muscle weakness (generalized): Secondary | ICD-10-CM | POA: Diagnosis not present

## 2018-05-22 DIAGNOSIS — Z79899 Other long term (current) drug therapy: Secondary | ICD-10-CM | POA: Diagnosis not present

## 2018-05-23 DIAGNOSIS — G4701 Insomnia due to medical condition: Secondary | ICD-10-CM | POA: Diagnosis not present

## 2018-05-23 DIAGNOSIS — F0150 Vascular dementia without behavioral disturbance: Secondary | ICD-10-CM | POA: Diagnosis not present

## 2018-06-04 DIAGNOSIS — G4701 Insomnia due to medical condition: Secondary | ICD-10-CM | POA: Diagnosis not present

## 2018-06-04 DIAGNOSIS — F0150 Vascular dementia without behavioral disturbance: Secondary | ICD-10-CM | POA: Diagnosis not present

## 2018-06-19 DIAGNOSIS — D509 Iron deficiency anemia, unspecified: Secondary | ICD-10-CM | POA: Diagnosis not present

## 2018-06-19 DIAGNOSIS — D649 Anemia, unspecified: Secondary | ICD-10-CM | POA: Diagnosis not present

## 2018-07-04 ENCOUNTER — Ambulatory Visit: Payer: Medicare Other | Admitting: Gastroenterology

## 2018-07-11 DIAGNOSIS — F0150 Vascular dementia without behavioral disturbance: Secondary | ICD-10-CM | POA: Diagnosis not present

## 2018-07-11 DIAGNOSIS — G4701 Insomnia due to medical condition: Secondary | ICD-10-CM | POA: Diagnosis not present

## 2018-08-06 ENCOUNTER — Encounter: Payer: Self-pay | Admitting: Gastroenterology

## 2018-08-06 ENCOUNTER — Ambulatory Visit (INDEPENDENT_AMBULATORY_CARE_PROVIDER_SITE_OTHER): Payer: Medicare Other | Admitting: Gastroenterology

## 2018-08-06 ENCOUNTER — Other Ambulatory Visit: Payer: Self-pay

## 2018-08-06 VITALS — BP 135/93 | HR 82

## 2018-08-06 DIAGNOSIS — B9681 Helicobacter pylori [H. pylori] as the cause of diseases classified elsewhere: Secondary | ICD-10-CM

## 2018-08-06 DIAGNOSIS — K279 Peptic ulcer, site unspecified, unspecified as acute or chronic, without hemorrhage or perforation: Secondary | ICD-10-CM | POA: Diagnosis not present

## 2018-08-06 NOTE — Addendum Note (Signed)
Addended by: Jackquline Denmark on: 08/06/2018 03:28 PM   Modules accepted: Orders, SmartSet

## 2018-08-06 NOTE — Progress Notes (Signed)
Melodie Bouillon, MD [address]  [address]  [place], [place] [address]  Main: [phone]  Fax: [phone]   Primary Care Physician: Derwood Kaplan, MD  Primary Gastroenterologist:  Dr. Melodie Bouillon  Chief Complaint  Patient presents with  . Follow-up    esophagitis, H_Pylori    HPI: [name] is a 62 y.o. male with history of esophagitis here for follow-up. The patient denies abdominal or flank pain, anorexia, nausea or vomiting, dysphagia, change in bowel habits or black or bloody stools or weight loss.  Patient is taking omeprazole 40 mg twice daily due to grade D esophagitis persistent on repeat EGD despite full dose PPI.  Initial EGD and November 2018, and repeat in May 2019 also showing grade D esophagitis.  Stomach biopsies showed H. pylori and patient is status post quadruple therapy.  H. pylori serology was recommended to be done at his living facility and was not done for some reason.  Surgery referral was also placed last time to evaluate for hiatal hernia repair given persistent grade D esophagitis and patient did not schedule this.  Current Outpatient Medications  Medication Sig Dispense Refill  . acetaminophen (TYLENOL) 650 MG suppository Place 650 mg rectally every 4 (four) hours as needed.    Marland Kitchen amLODipine (NORVASC) 10 MG tablet Take 1 tablet (10 mg total) by mouth daily.    Marland Kitchen aspirin EC 81 MG EC tablet Take 1 tablet (81 mg total) by mouth daily.    Marland Kitchen atorvastatin (LIPITOR) 40 MG tablet Take 1 tablet (40 mg total) by mouth daily at 6 PM. (Patient taking differently: Take 40 mg daily by mouth. ) 30 tablet 0  . cloNIDine (CATAPRES) 0.2 MG tablet Take 1 tablet (0.2 mg total) by mouth 2 (two) times daily. 60 tablet 11  . ferrous sulfate 325 (65 FE) MG tablet Take 1 tablet (325 mg total) 3 (three) times daily with meals by mouth. 90 tablet 0  . hydrALAZINE (APRESOLINE) 100 MG tablet Take 100 mg by mouth 3 (three) times daily.    . phenytoin  (DILANTIN) 300 MG ER capsule Take 1 capsule (300 mg total) by mouth at bedtime.    . polyethylene glycol (MIRALAX / GLYCOLAX) packet Take 17 g by mouth daily. (Patient taking differently: Take 17 g daily as needed by mouth. ) 14 each 0  . traZODone (DESYREL) 50 MG tablet Take 1 tablet (50 mg total) by mouth at bedtime.    . hydrochlorothiazide (HYDRODIURIL) 25 MG tablet Take 1 tablet (25 mg total) by mouth daily. (Patient not taking: Reported on 08/06/2018) 30 tablet 0  . hydrOXYzine (ATARAX/VISTARIL) 25 MG tablet Take 1 tablet (25 mg total) by mouth every 6 (six) hours as needed for anxiety. (Patient not taking: Reported on 08/06/2018) 20 tablet 0  . metroNIDAZOLE (FLAGYL) 250 MG tablet Take 1 tablet (250 mg total) by mouth 4 (four) times daily. (Patient not taking: Reported on 08/06/2018) 30 tablet 0  . omeprazole (PRILOSEC) 40 MG capsule Take 1 capsule (40 mg total) by mouth 2 (two) times daily before a meal. 60 capsule 2  . ondansetron (ZOFRAN ODT) 4 MG disintegrating tablet Take 1 tablet (4 mg total) every 8 (eight) hours as needed by mouth for nausea or vomiting. (Patient not taking: Reported on 08/06/2018) 20 tablet 0  . ondansetron (ZOFRAN) 4 MG tablet     . QUEtiapine (SEROQUEL) 25 MG tablet Take 1 tablet (25 mg total) by mouth at bedtime. (Patient not taking: Reported  on 08/06/2018)     No current facility-administered medications for this visit.     Allergies as of 08/06/2018 - Review Complete 08/06/2018  Allergen Reaction Noted  . Iodine Anaphylaxis and Itching 04/16/2017  . Shellfish allergy Hives and Swelling 02/13/2017    ROS:  General: Negative for anorexia, weight loss, fever, chills, fatigue, weakness. ENT: Negative for hoarseness, difficulty swallowing , nasal congestion. CV: Negative for chest pain, angina, palpitations, dyspnea on exertion, peripheral edema.  Respiratory: Negative for dyspnea at rest, dyspnea on exertion, cough, sputum, wheezing.  GI: See history of  present illness. GU:  Negative for dysuria, hematuria, urinary incontinence, urinary frequency, nocturnal urination.  Endo: Negative for unusual weight change.    Physical Examination:   BP (!) 135/93   Pulse 82   General: Well-nourished, well-developed in no acute distress.  Eyes: No icterus. Conjunctivae pink. Mouth: Oropharyngeal mucosa moist and pink , no lesions erythema or exudate. Neck: Supple, Trachea midline Abdomen: Bowel sounds are normal, nontender, nondistended, no hepatosplenomegaly or masses, no abdominal bruits or hernia , no rebound or guarding.   Extremities: No lower extremity edema. No clubbing or deformities. Neuro: Alert and oriented x 3.  Grossly intact. Skin: Warm and dry, no jaundice.   Psych: Alert and cooperative, normal mood and affect.   Labs: CMP     Component Value Date/Time   NA 133 (L) 09/26/2017 0433   NA 135 (L) 10/11/2013 1047   K 3.9 09/26/2017 0433   K 3.6 10/11/2013 1047   CL 98 (L) 09/26/2017 0433   CL 105 10/11/2013 1047   CO2 27 09/26/2017 0433   CO2 25 10/11/2013 1047   GLUCOSE 111 (H) 09/26/2017 0433   GLUCOSE 119 (H) 10/11/2013 1047   BUN 32 (H) 09/26/2017 0433   BUN 16 10/11/2013 1047   CREATININE 1.55 (H) 09/26/2017 0433   CREATININE 1.23 10/11/2013 1047   CALCIUM 9.2 09/26/2017 0433   CALCIUM 9.0 10/11/2013 1047   PROT 7.5 09/21/2017 2211   PROT 7.9 10/11/2013 1047   ALBUMIN 3.5 09/21/2017 2211   ALBUMIN 3.6 10/11/2013 1047   AST 43 (H) 09/21/2017 2211   AST 43 (H) 10/11/2013 1047   ALT 44 09/21/2017 2211   ALT 39 10/11/2013 1047   ALKPHOS 189 (H) 09/21/2017 2211   ALKPHOS 139 (H) 10/11/2013 1047   BILITOT 0.7 09/21/2017 2211   BILITOT 0.4 10/11/2013 1047   GFRNONAA 47 (L) 09/26/2017 0433   GFRNONAA >60 10/11/2013 1047   GFRAA 54 (L) 09/26/2017 0433   GFRAA >60 10/11/2013 1047   Lab Results  Component Value Date   WBC 5.8 09/26/2017   HGB 13.6 09/26/2017   HCT 42.0 09/26/2017   MCV 84.7 09/26/2017   PLT 190  09/26/2017    Imaging Studies: No results found.  Assessment and Plan:   [name] is a 62 y.o. y/o male with grade D esophagitis here for follow-up  We will repeat EGD to see if his esophagitis is improved with prolonged full dose PPI Surgery referral was placed and they contacted him but an appointment was never made.  Will start referral again to evaluate for hiatal hernia repair given persistent grade D esophagitis  Continue Prilosec 40 mg twice daily due to persistent bradycardia esophagitis on 2 EGDs (Risks of PPI use were discussed with patient including bone loss, C. Diff diarrhea, pneumonia, infections, CKD, electrolyte abnormalities.  Pt. Verbalizes understanding and chooses to continue the medication.)  I have discussed alternative options, risks &  benefits,  which include, but are not limited to, bleeding, infection, perforation,respiratory complication & drug reaction.  The patient agrees with this plan & written consent will be obtained.    H. pylori serology ordered and drawn today to check for eradication   Recommendations for PCP at living facility:  Patient continues to refuse colonoscopy for CRC screening.  As noted in my previous recommendations sent to the facility, PCP can order alternative testing such as stool based testing for CRC screening.  Continue acid reflux lifestyle modifications Head of bed elevated to 30 degrees at all times Avoid eating 3 hours before bedtime Follow-up with surgery referral We will obtain H. pylori serology    Above note was printed and sent back with patient to the living facility   Dr Melodie Bouillon

## 2018-08-08 DIAGNOSIS — F0150 Vascular dementia without behavioral disturbance: Secondary | ICD-10-CM | POA: Diagnosis not present

## 2018-08-08 DIAGNOSIS — G4701 Insomnia due to medical condition: Secondary | ICD-10-CM | POA: Diagnosis not present

## 2018-08-08 LAB — H PYLORI, IGM, IGG, IGA AB
H. PYLORI, IGA ABS: 32.9 U — AB (ref 0.0–8.9)
H. PYLORI, IGM ABS: 12.8 U — AB (ref 0.0–8.9)
H. pylori, IgG AbS: 4.14 Index Value — ABNORMAL HIGH (ref 0.00–0.79)

## 2018-08-19 DIAGNOSIS — H548 Legal blindness, as defined in USA: Secondary | ICD-10-CM | POA: Diagnosis not present

## 2018-08-19 DIAGNOSIS — G40909 Epilepsy, unspecified, not intractable, without status epilepticus: Secondary | ICD-10-CM | POA: Diagnosis not present

## 2018-08-19 DIAGNOSIS — N183 Chronic kidney disease, stage 3 (moderate): Secondary | ICD-10-CM | POA: Diagnosis not present

## 2018-08-19 DIAGNOSIS — F0390 Unspecified dementia without behavioral disturbance: Secondary | ICD-10-CM | POA: Diagnosis not present

## 2018-08-29 ENCOUNTER — Encounter
Admission: RE | Disposition: A | Discharge status: Skilled Nursing Facility | Payer: Self-pay | Source: Ambulatory Visit | Attending: Gastroenterology

## 2018-08-29 ENCOUNTER — Ambulatory Visit: Payer: Medicare Other | Admitting: Anesthesiology

## 2018-08-29 ENCOUNTER — Ambulatory Visit
Admission: RE | Admit: 2018-08-29 | Discharge: 2018-08-29 | Disposition: A | Discharge status: Skilled Nursing Facility | Payer: Medicare Other | Source: Ambulatory Visit | Attending: Gastroenterology | Admitting: Gastroenterology

## 2018-08-29 DIAGNOSIS — D649 Anemia, unspecified: Secondary | ICD-10-CM | POA: Insufficient documentation

## 2018-08-29 DIAGNOSIS — I10 Essential (primary) hypertension: Secondary | ICD-10-CM | POA: Insufficient documentation

## 2018-08-29 DIAGNOSIS — Z87891 Personal history of nicotine dependence: Secondary | ICD-10-CM | POA: Diagnosis not present

## 2018-08-29 DIAGNOSIS — Z5309 Procedure and treatment not carried out because of other contraindication: Secondary | ICD-10-CM | POA: Insufficient documentation

## 2018-08-29 DIAGNOSIS — Z79899 Other long term (current) drug therapy: Secondary | ICD-10-CM | POA: Insufficient documentation

## 2018-08-29 DIAGNOSIS — R569 Unspecified convulsions: Secondary | ICD-10-CM | POA: Diagnosis not present

## 2018-08-29 DIAGNOSIS — B9681 Helicobacter pylori [H. pylori] as the cause of diseases classified elsewhere: Secondary | ICD-10-CM

## 2018-08-29 DIAGNOSIS — Z7982 Long term (current) use of aspirin: Secondary | ICD-10-CM | POA: Insufficient documentation

## 2018-08-29 DIAGNOSIS — K208 Other esophagitis: Secondary | ICD-10-CM | POA: Diagnosis not present

## 2018-08-29 DIAGNOSIS — K279 Peptic ulcer, site unspecified, unspecified as acute or chronic, without hemorrhage or perforation: Secondary | ICD-10-CM

## 2018-08-29 DIAGNOSIS — Z8673 Personal history of transient ischemic attack (TIA), and cerebral infarction without residual deficits: Secondary | ICD-10-CM | POA: Insufficient documentation

## 2018-08-29 SURGERY — ESOPHAGOGASTRODUODENOSCOPY (EGD) WITH PROPOFOL
Anesthesia: General

## 2018-08-29 MED ORDER — SODIUM CHLORIDE 0.9 % IV SOLN: INTRAVENOUS | Status: DC

## 2018-08-29 NOTE — Anesthesia Preprocedure Evaluation (Deleted)
Anesthesia Evaluation  Patient identified by MRN, date of birth, ID band Patient awake    Reviewed: Allergy & Precautions, H&P , NPO status , reviewed documented beta blocker date and time   Airway Mallampati: III  TM Distance: >3 FB Neck ROM: full    Dental  (+) Poor Dentition, Chipped, Missing   Pulmonary former smoker,    Pulmonary exam normal        Cardiovascular hypertension, Normal cardiovascular exam     Neuro/Psych Seizures -, Well Controlled,  CVA    GI/Hepatic hiatal hernia,   Endo/Other    Renal/GU      Musculoskeletal   Abdominal   Peds  Hematology  (+) Blood dyscrasia, anemia ,   Anesthesia Other Findings Past Medical History: No date: Hypertension No date: Seizures (HCC) No date: Stroke [facility](HCC)  Past Surgical History: No date: blind No date: CLOSED REDUCTION CRANIOFACIAL SEPARATION 08/02/2017: COLONOSCOPY; N/A     Comment:  Procedure: COLONOSCOPY;  Surgeon: Pasty Spillersahiliani, Varnita B,               MD;  Location: [facility] END[facility];  Service: Endoscopy;                Laterality: N/A; 07/31/2017: ESOPHAGOGASTRODUODENOSCOPY (EGD) WITH PROPOFOL; N/A     Comment:  Procedure: ESOPHAGOGASTRODUODENOSCOPY (EGD) WITH               PROPOFOL;  Surgeon: Pasty Spillersahiliani, Varnita B, MD;  Location:               [facility];  Service: Endoscopy;  Laterality: N/A; 01/30/2018: ESOPHAGOGASTRODUODENOSCOPY (EGD) WITH PROPOFOL; N/A     Comment:  Procedure: ESOPHAGOGASTRODUODENOSCOPY (EGD) WITH               PROPOFOL;  Surgeon: Pasty Spillersahiliani, Varnita B, MD;  Location:               [facility];  Service: Endoscopy;  Laterality: N/A; No date: LUNG REMOVAL, PARTIAL; Right     Reproductive/Obstetrics                             Anesthesia Physical Anesthesia Plan  ASA: III  Anesthesia Plan: General   Post-op Pain Management:    Induction:   PONV Risk Score and Plan: 2 and Treatment may vary due to  age or medical condition and TIVA  Airway Management Planned:   Additional Equipment:   Intra-op Plan:   Post-operative Plan:   Informed Consent: I have reviewed the patients History and Physical, chart, labs and discussed the procedure including the risks, benefits and alternatives for the proposed anesthesia with the patient or authorized representative who has indicated his/her understanding and acceptance.   Dental Advisory Given  Plan Discussed with: CRNA  Anesthesia Plan Comments:         Anesthesia Quick Evaluation

## 2018-08-29 NOTE — OR Nursing (Signed)
Patient's procedure cancelled due to patient having breakfast this morning.  Office to call [facility] to reschedule.

## 2018-09-12 DIAGNOSIS — F0150 Vascular dementia without behavioral disturbance: Secondary | ICD-10-CM | POA: Diagnosis not present

## 2018-09-12 DIAGNOSIS — G4701 Insomnia due to medical condition: Secondary | ICD-10-CM | POA: Diagnosis not present

## 2018-10-08 DIAGNOSIS — D649 Anemia, unspecified: Secondary | ICD-10-CM | POA: Diagnosis not present

## 2018-10-08 DIAGNOSIS — F0631 Mood disorder due to known physiological condition with depressive features: Secondary | ICD-10-CM | POA: Diagnosis not present

## 2018-10-08 DIAGNOSIS — F064 Anxiety disorder due to known physiological condition: Secondary | ICD-10-CM | POA: Diagnosis not present

## 2018-10-08 DIAGNOSIS — Z79899 Other long term (current) drug therapy: Secondary | ICD-10-CM | POA: Diagnosis not present

## 2018-10-08 DIAGNOSIS — F0150 Vascular dementia without behavioral disturbance: Secondary | ICD-10-CM | POA: Diagnosis not present

## 2018-10-10 DIAGNOSIS — R319 Hematuria, unspecified: Secondary | ICD-10-CM | POA: Diagnosis not present

## 2018-10-10 DIAGNOSIS — N390 Urinary tract infection, site not specified: Secondary | ICD-10-CM | POA: Diagnosis not present

## 2018-10-17 ENCOUNTER — Other Ambulatory Visit: Payer: Self-pay

## 2018-10-17 ENCOUNTER — Telehealth: Payer: Self-pay

## 2018-10-17 DIAGNOSIS — K209 Esophagitis, unspecified without bleeding: Secondary | ICD-10-CM

## 2018-10-17 DIAGNOSIS — B9681 Helicobacter pylori [H. pylori] as the cause of diseases classified elsewhere: Secondary | ICD-10-CM

## 2018-10-17 DIAGNOSIS — K279 Peptic ulcer, site unspecified, unspecified as acute or chronic, without hemorrhage or perforation: Principal | ICD-10-CM

## 2018-10-17 NOTE — Telephone Encounter (Signed)
Scheduled EGD with Rodney Booze (RN at [facility]) for pt on 11/06/18 at [facility].  Instructions have been faxed to her attention. Reminded Rodney Booze to make sure patient does not eat or drink after midnight the night before procedure.  Thanks [employer] [facility] [employer]

## 2018-10-17 NOTE — Telephone Encounter (Signed)
-----   Message from Fenton Malling, LPN sent at 01/11/3789  2:21 PM EST ----- Regarding: EGD Pt at [facility]. EGD: K27.9, B96.81 H Pylori ulcer You can fax them the prep if they don't have the one from December 2019. I would schedule this and just let them know when. Thanks! Debbie ----- Message ----- From: Pasty Spillers, MD Sent: 08/29/2018  11:27 AM EST To: Fenton Malling, LPN  His EGD was cancelled as he ate breakfast. This needs to be rescheduled

## 2018-10-24 DIAGNOSIS — G4701 Insomnia due to medical condition: Secondary | ICD-10-CM | POA: Diagnosis not present

## 2018-10-24 DIAGNOSIS — F0150 Vascular dementia without behavioral disturbance: Secondary | ICD-10-CM | POA: Diagnosis not present

## 2018-10-28 DIAGNOSIS — F0390 Unspecified dementia without behavioral disturbance: Secondary | ICD-10-CM | POA: Diagnosis not present

## 2018-10-28 DIAGNOSIS — I1310 Hypertensive heart and chronic kidney disease without heart failure, with stage 1 through stage 4 chronic kidney disease, or unspecified chronic kidney disease: Secondary | ICD-10-CM | POA: Diagnosis not present

## 2018-10-28 DIAGNOSIS — G40909 Epilepsy, unspecified, not intractable, without status epilepticus: Secondary | ICD-10-CM | POA: Diagnosis not present

## 2018-10-28 DIAGNOSIS — N183 Chronic kidney disease, stage 3 (moderate): Secondary | ICD-10-CM | POA: Diagnosis not present

## 2018-11-06 ENCOUNTER — Encounter
Admission: RE | Disposition: A | Discharge status: Home / Self Care | Payer: Self-pay | Source: Home / Self Care | Attending: Gastroenterology

## 2018-11-06 ENCOUNTER — Ambulatory Visit
Admission: RE | Admit: 2018-11-06 | Discharge: 2018-11-06 | Disposition: A | Discharge status: Home / Self Care | Payer: Medicare Other | Attending: Gastroenterology | Admitting: Gastroenterology

## 2018-11-06 ENCOUNTER — Ambulatory Visit: Payer: Medicare Other | Admitting: Anesthesiology

## 2018-11-06 DIAGNOSIS — K279 Peptic ulcer, site unspecified, unspecified as acute or chronic, without hemorrhage or perforation: Secondary | ICD-10-CM

## 2018-11-06 DIAGNOSIS — Z8673 Personal history of transient ischemic attack (TIA), and cerebral infarction without residual deficits: Secondary | ICD-10-CM | POA: Insufficient documentation

## 2018-11-06 DIAGNOSIS — B9681 Helicobacter pylori [H. pylori] as the cause of diseases classified elsewhere: Secondary | ICD-10-CM | POA: Diagnosis not present

## 2018-11-06 DIAGNOSIS — Z79899 Other long term (current) drug therapy: Secondary | ICD-10-CM | POA: Insufficient documentation

## 2018-11-06 DIAGNOSIS — Z7982 Long term (current) use of aspirin: Secondary | ICD-10-CM | POA: Insufficient documentation

## 2018-11-06 DIAGNOSIS — K449 Diaphragmatic hernia without obstruction or gangrene: Secondary | ICD-10-CM | POA: Diagnosis not present

## 2018-11-06 DIAGNOSIS — K2950 Unspecified chronic gastritis without bleeding: Secondary | ICD-10-CM | POA: Diagnosis not present

## 2018-11-06 DIAGNOSIS — K208 Other esophagitis: Secondary | ICD-10-CM | POA: Diagnosis present

## 2018-11-06 DIAGNOSIS — K3189 Other diseases of stomach and duodenum: Secondary | ICD-10-CM | POA: Diagnosis not present

## 2018-11-06 DIAGNOSIS — K2100 Gastro-esophageal reflux disease with esophagitis, without bleeding: Secondary | ICD-10-CM

## 2018-11-06 DIAGNOSIS — A048 Other specified bacterial intestinal infections: Secondary | ICD-10-CM | POA: Diagnosis not present

## 2018-11-06 DIAGNOSIS — Z87891 Personal history of nicotine dependence: Secondary | ICD-10-CM | POA: Insufficient documentation

## 2018-11-06 DIAGNOSIS — I10 Essential (primary) hypertension: Secondary | ICD-10-CM | POA: Insufficient documentation

## 2018-11-06 DIAGNOSIS — K2930 Chronic superficial gastritis without bleeding: Secondary | ICD-10-CM | POA: Diagnosis not present

## 2018-11-06 HISTORY — PX: ESOPHAGOGASTRODUODENOSCOPY (EGD) WITH PROPOFOL: SHX5813

## 2018-11-06 SURGERY — ESOPHAGOGASTRODUODENOSCOPY (EGD) WITH PROPOFOL
Anesthesia: General

## 2018-11-06 MED ORDER — PROPOFOL 500 MG/50ML IV EMUL
INTRAVENOUS | Status: DC | PRN
  Administered 2018-11-06: 180 ug/kg/min via INTRAVENOUS

## 2018-11-06 MED ORDER — LIDOCAINE HCL (CARDIAC) PF 100 MG/5ML IV SOSY
PREFILLED_SYRINGE | INTRAVENOUS | Status: DC | PRN
  Administered 2018-11-06: 80 mg via INTRAVENOUS

## 2018-11-06 MED ORDER — SODIUM CHLORIDE 0.9 % IV SOLN
INTRAVENOUS | Status: DC
  Administered 2018-11-06: 11:00:00 via INTRAVENOUS
  Administered 2018-11-06: 1000 mL via INTRAVENOUS

## 2018-11-06 NOTE — Op Note (Signed)
[facility] Gastroenterology Patient Name: Jerry ChurnDennis Tetzlaff Procedure Date: 11/06/2018 10:58 AM MRN: [id number] Account #: [id number] Date of Birth: 04-21-1956 Admit Type: Outpatient Age: 63 Room: [facility] ENDO ROOM 4 Gender: Male Note Status: Finalized Procedure:            Upper GI endoscopy Indications:          Reflux esophagitis, Follow-up of reflux esophagitis,                        Helicobacter pylori, Follow-up of Helicobacter pylori Providers:            Javarri Segal B. Maximino Greenlandahiliani MD, MD Medicines:            Monitored Anesthesia Care Complications:        No immediate complications. Procedure:            Pre-Anesthesia Assessment:                       - The risks and benefits of the procedure and the                        sedation options and risks were discussed with the                        patient. All questions were answered and informed                        consent was obtained.                       - Patient identification and proposed procedure were                        verified prior to the procedure.                       - ASA Grade Assessment: III - A patient with severe                        systemic disease.                       After obtaining informed consent, the endoscope was                        passed under direct vision. Throughout the procedure,                        the patient's blood pressure, pulse, and oxygen                        saturations were monitored continuously. The Endoscope                        was introduced through the mouth, and advanced to the                        second part of duodenum. The upper GI endoscopy was                        accomplished with ease. The patient tolerated the  procedure well. Findings:      LA Grade C (one or more mucosal breaks continuous between tops of 2 or       more mucosal folds, less than 75% circumference) esophagitis with no       bleeding was  found in the distal esophagus.      A large hiatal hernia was present.      There is no endoscopic evidence of inflammation or ulceration in the       entire examined stomach. Biopsies were obtained in the gastric body, at       the incisura and in the gastric antrum with cold forceps for       Helicobacter pylori testing.      Patchy mild mucosal changes characterized by discoloration and white       specks were found in the duodenal bulb and in the second portion of the       duodenum. Biopsies were taken with a cold forceps for histology. Impression:           - LA Grade C reflux esophagitis.                       - Large hiatal hernia.                       - Mucosal changes in the duodenum. Biopsied.                       - Biopsies were obtained in the gastric body, at the                        incisura and in the gastric antrum. Recommendation:       - Follow an antireflux regimen.                       - Take prescribed proton pump inhibitor or H2 blocker                        (antacid) medications 30 - 60 minutes before meals.                       - Continue present medications.                       - Await pathology results.                       - Return to my office in 2 weeks.                       - The findings and recommendations were discussed with                        the patient.                       - The findings and recommendations were discussed with                        the designated responsible adult. Procedure Code(s):    --- Professional ---  76283, Esophagogastroduodenoscopy, flexible, transoral;                        with biopsy, single or multiple Diagnosis Code(s):    --- Professional ---                       K21.0, Gastro-esophageal reflux disease with esophagitis                       K44.9, Diaphragmatic hernia without obstruction or                        gangrene                       K31.89, Other diseases of stomach  and duodenum                       B96.81, Helicobacter pylori [H. pylori] as the cause of                        diseases classified elsewhere CPT copyright 2018 American Medical Association. All rights reserved. The codes documented in this report are preliminary and upon coder review may  be revised to meet current compliance requirements.  Melodie Bouillon, MD Michel Bickers B. Maximino Greenland MD, MD 11/06/2018 11:20:38 AM This report has been signed electronically. Number of Addenda: 0 Note Initiated On: 11/06/2018 10:58 AM Estimated Blood Loss: Estimated blood loss: none.      [facility]

## 2018-11-06 NOTE — Anesthesia Procedure Notes (Signed)
Date/Time: 11/06/2018 11:00 AM Performed by: Henrietta Hoover, CRNA Pre-anesthesia Checklist: Patient identified, Emergency Drugs available, Suction available, Patient being monitored and Timeout performed Oxygen Delivery Method: Nasal cannula Placement Confirmation: positive ETCO2

## 2018-11-06 NOTE — Anesthesia Post-op Follow-up Note (Signed)
Anesthesia QCDR form completed.        

## 2018-11-06 NOTE — Anesthesia Preprocedure Evaluation (Signed)
Anesthesia Evaluation  Patient identified by MRN, date of birth, ID band Patient awake    Reviewed: Allergy & Precautions, NPO status , Patient's Chart, lab work & pertinent test results, reviewed documented beta blocker date and time   Airway Mallampati: II  TM Distance: >3 FB     Dental  (+) Chipped, Upper Dentures   Pulmonary former smoker,           Cardiovascular hypertension, Pt. on medications      Neuro/Psych Seizures -,  CVA, Residual Symptoms    GI/Hepatic hiatal hernia,   Endo/Other    Renal/GU      Musculoskeletal   Abdominal   Peds  Hematology  (+) anemia ,   Anesthesia Other Findings Generalized weakness. Lung ca with partial lobectomy. LVH on EKG. Pt is legally blind.  Reproductive/Obstetrics                             Anesthesia Physical Anesthesia Plan  ASA: III  Anesthesia Plan: General   Post-op Pain Management:    Induction: Intravenous  PONV Risk Score and Plan:   Airway Management Planned:   Additional Equipment:   Intra-op Plan:   Post-operative Plan:   Informed Consent: I have reviewed the patients History and Physical, chart, labs and discussed the procedure including the risks, benefits and alternatives for the proposed anesthesia with the patient or authorized representative who has indicated his/her understanding and acceptance.       Plan Discussed with: CRNA  Anesthesia Plan Comments:         Anesthesia Quick Evaluation

## 2018-11-06 NOTE — H&P (Signed)
Melodie Bouillon, MD [address], [address], [place], [place], [address] [address], [address], [place], [place], [address] Phone: [phone]  Fax: [phone]  Primary Care Physician:  Charlott Rakes, MD   Pre-Procedure History & Physical: HPI:  [name] is a 63 y.o. male is here for an EGD.   Past Medical History:  Diagnosis Date  . Hypertension   . Seizures (HCC)   . Stroke [facility])     Past Surgical History:  Procedure Laterality Date  . blind    . CLOSED REDUCTION CRANIOFACIAL SEPARATION    . COLONOSCOPY N/A 08/02/2017   Procedure: COLONOSCOPY;  Surgeon: Pasty Spillers, MD;  Location: [facility] ENDOSCOPY;  Service: Endoscopy;  Laterality: N/A;  . ESOPHAGOGASTRODUODENOSCOPY (EGD) WITH PROPOFOL N/A 07/31/2017   Procedure: ESOPHAGOGASTRODUODENOSCOPY (EGD) WITH PROPOFOL;  Surgeon: Pasty Spillers, MD;  Location: [facility] ENDOSCOPY;  Service: Endoscopy;  Laterality: N/A;  . ESOPHAGOGASTRODUODENOSCOPY (EGD) WITH PROPOFOL N/A 01/30/2018   Procedure: ESOPHAGOGASTRODUODENOSCOPY (EGD) WITH PROPOFOL;  Surgeon: Pasty Spillers, MD;  Location: [facility] ENDOSCOPY;  Service: Endoscopy;  Laterality: N/A;  . LUNG REMOVAL, PARTIAL Right     Prior to Admission medications   Medication Sig Start Date End Date Taking? Authorizing Provider  acetaminophen (TYLENOL) 650 MG suppository Place 650 mg rectally every 4 (four) hours as needed.   Yes [provider]  amLODipine (NORVASC) 10 MG tablet Take 1 tablet (10 mg total) by mouth daily. 09/27/17  Yes Auburn Bilberry, MD  aspirin EC 81 MG EC tablet Take 1 tablet (81 mg total) by mouth daily. 02/16/17  Yes Auburn Bilberry, MD  cloNIDine (CATAPRES) 0.2 MG tablet Take 1 tablet (0.2 mg total) by mouth 2 (two) times daily. 02/15/17  Yes Auburn Bilberry, MD  ferrous sulfate 325 (65 FE) MG tablet Take 1 tablet (325 mg total) 3 (three) times daily with meals by mouth. 08/02/17  Yes Shaune Pollack, MD  hydrALAZINE (APRESOLINE) 100 MG tablet  Take 100 mg by mouth 3 (three) times daily. 04/19/17  Yes [provider]  hydrochlorothiazide (HYDRODIURIL) 25 MG tablet Take 1 tablet (25 mg total) by mouth daily. 04/13/17  Yes Enedina Finner, MD  hydrOXYzine (ATARAX/VISTARIL) 25 MG tablet Take 1 tablet (25 mg total) by mouth every 6 (six) hours as needed for anxiety. 09/05/17  Yes Sharman Cheek, MD  metroNIDAZOLE (FLAGYL) 250 MG tablet Take 1 tablet (250 mg total) by mouth 4 (four) times daily. 02/07/18  Yes Pasty Spillers, MD  ondansetron (ZOFRAN ODT) 4 MG disintegrating tablet Take 1 tablet (4 mg total) every 8 (eight) hours as needed by mouth for nausea or vomiting. 08/11/17  Yes Jene Every, MD  ondansetron [facility]) 4 MG tablet  01/14/18  Yes [provider]  phenytoin (DILANTIN) 300 MG ER capsule Take 1 capsule (300 mg total) by mouth at bedtime. 09/26/17  Yes Auburn Bilberry, MD  QUEtiapine (SEROQUEL) 25 MG tablet Take 1 tablet (25 mg total) by mouth at bedtime. 09/26/17  Yes Auburn Bilberry, MD  traZODone (DESYREL) 50 MG tablet Take 1 tablet (50 mg total) by mouth at bedtime. 09/26/17  Yes Auburn Bilberry, MD  atorvastatin (LIPITOR) 40 MG tablet Take 1 tablet (40 mg total) by mouth daily at 6 PM. Patient taking differently: Take 40 mg daily by mouth.  02/15/17   Auburn Bilberry, MD  omeprazole (PRILOSEC) 40 MG capsule Take 1 capsule (40 mg total) by mouth 2 (two) times daily before a meal. 01/30/18 03/01/18  Pasty Spillers, MD  polyethylene  glycol (MIRALAX / GLYCOLAX) packet Take 17 g by mouth daily. Patient taking differently: Take 17 g daily as needed by mouth.  02/15/17   Auburn Bilberry, MD    Allergies as of 10/17/2018 - Review Complete 08/29/2018  Allergen Reaction Noted  . Iodine Anaphylaxis and Itching 04/16/2017  . Shellfish allergy Hives and Swelling 02/13/2017    Family History  Problem Relation Age of Onset  . Hypertension Mother   . CAD Mother     Social History   Socioeconomic History  .  Marital status: Married    Spouse name: Not on file  . Number of children: Not on file  . Years of education: Not on file  . Highest education level: Not on file  Occupational History  . Occupation: disabled  Social Needs  . Financial resource strain: Not hard at all  . Food insecurity:    Worry: Never true    Inability: Never true  . Transportation needs:    Medical: No    Non-medical: No  Tobacco Use  . Smoking status: Former Games developer  . Smokeless tobacco: Never Used  Substance and Sexual Activity  . Alcohol use: No  . Drug use: No  . Sexual activity: Not Currently  Lifestyle  . Physical activity:    Days per week: 0 days    Minutes per session: 0 min  . Stress: Not at all  Relationships  . Social connections:    Talks on phone: Never    Gets together: Never    Attends religious service: More than 4 times per year    Active member of club or organization: No    Attends meetings of clubs or organizations: Never    Relationship status: Married  . Intimate partner violence:    Fear of current or ex partner: Patient refused    Emotionally abused: Patient refused    Physically abused: Patient refused    Forced sexual activity: Patient refused  Other Topics Concern  . Not on file  Social History Narrative   Pt visually impaired; per family crawls around the house    Review of Systems: See HPI, otherwise negative ROS  Physical Exam: BP (!) 146/112   Pulse 75   Temp (!) 95.2 F (35.1 C) (Tympanic)   Resp 18   Ht 6\' 2"  (1.88 m)   Wt 79.4 kg   SpO2 99%   BMI 22.47 kg/m  General:   Alert,  pleasant and cooperative in NAD Head:  Normocephalic and atraumatic. Neck:  Supple; no masses or thyromegaly. Lungs:  Clear throughout to auscultation, normal respiratory effort.    Heart:  +S1, +S2, Regular rate and rhythm, No edema. Abdomen:  Soft, nontender and nondistended. Normal bowel sounds, without guarding, and without rebound.   Neurologic:  Alert and  oriented x4;   grossly normal neurologically.  Impression/Plan: [name] is here for an EGD for grade D esophagitis and H Pylori  Risks, benefits, limitations, and alternatives regarding the procedure have been reviewed with the patient.  Questions have been answered.  All parties agreeable.   Pasty Spillers, MD  11/06/2018, 10:53 AM

## 2018-11-06 NOTE — Anesthesia Postprocedure Evaluation (Signed)
Anesthesia Post Note  Patient: [name]  Procedure(s) Performed: ESOPHAGOGASTRODUODENOSCOPY (EGD) WITH PROPOFOL (N/A )  Patient location during evaluation: Endoscopy Anesthesia Type: General Level of consciousness: awake and alert Pain management: pain level controlled Vital Signs Assessment: post-procedure vital signs reviewed and stable Respiratory status: spontaneous breathing, nonlabored ventilation, respiratory function stable and patient connected to nasal cannula oxygen Cardiovascular status: blood pressure returned to baseline and stable Postop Assessment: no apparent nausea or vomiting Anesthetic complications: no     Last Vitals:  Vitals:   11/06/18 1133 11/06/18 1143  BP: (!) 141/98 (!) 157/103  Pulse: 74 74  Resp: (!) 22 (!) 24  Temp:    SpO2: 96% 96%    Last Pain:  Vitals:   11/06/18 1143  TempSrc:   PainSc: 0-No pain                 Tye Juarez S

## 2018-11-06 NOTE — Transfer of Care (Signed)
Immediate Anesthesia Transfer of Care Note  Patient: [name]  Procedure(s) Performed: ESOPHAGOGASTRODUODENOSCOPY (EGD) WITH PROPOFOL (N/A )  Patient Location: PACU  Anesthesia Type:General  Level of Consciousness: sedated  Airway & Oxygen Therapy: Patient Spontanous Breathing and Patient connected to nasal cannula oxygen  Post-op Assessment: Report given to RN and Post -op Vital signs reviewed and stable  Post vital signs: Reviewed and stable  Last Vitals:  Vitals Value Taken Time  BP 133/96 11/06/2018 11:24 AM  Temp 36.1 C 11/06/2018 11:23 AM  Pulse 81 11/06/2018 11:25 AM  Resp 23 11/06/2018 11:25 AM  SpO2 94 % 11/06/2018 11:25 AM  Vitals shown include unvalidated device data.  Last Pain:  Vitals:   11/06/18 1123  TempSrc: Tympanic  PainSc:          Complications: No apparent anesthesia complications

## 2018-11-07 ENCOUNTER — Encounter: Payer: Self-pay | Admitting: Gastroenterology

## 2018-11-07 DIAGNOSIS — R569 Unspecified convulsions: Secondary | ICD-10-CM | POA: Diagnosis not present

## 2018-11-07 DIAGNOSIS — G4089 Other seizures: Secondary | ICD-10-CM | POA: Diagnosis not present

## 2018-11-07 DIAGNOSIS — D509 Iron deficiency anemia, unspecified: Secondary | ICD-10-CM | POA: Diagnosis not present

## 2018-11-07 DIAGNOSIS — D649 Anemia, unspecified: Secondary | ICD-10-CM | POA: Diagnosis not present

## 2018-11-07 LAB — SURGICAL PATHOLOGY

## 2018-11-19 ENCOUNTER — Ambulatory Visit (INDEPENDENT_AMBULATORY_CARE_PROVIDER_SITE_OTHER): Payer: Medicare Other | Admitting: Gastroenterology

## 2018-11-19 ENCOUNTER — Encounter: Payer: Self-pay | Admitting: Gastroenterology

## 2018-11-19 ENCOUNTER — Other Ambulatory Visit: Payer: Self-pay

## 2018-11-19 VITALS — BP 129/77 | HR 70

## 2018-11-19 DIAGNOSIS — K449 Diaphragmatic hernia without obstruction or gangrene: Secondary | ICD-10-CM | POA: Diagnosis not present

## 2018-11-19 DIAGNOSIS — K209 Esophagitis, unspecified without bleeding: Secondary | ICD-10-CM

## 2018-11-19 DIAGNOSIS — A048 Other specified bacterial intestinal infections: Secondary | ICD-10-CM | POA: Diagnosis not present

## 2018-11-19 MED ORDER — LEVOFLOXACIN 500 MG PO TABS: 500.0000 mg | ORAL_TABLET | Freq: Every day | ORAL | 0 refills | Status: AC

## 2018-11-19 MED ORDER — AMOXICILLIN 500 MG PO TABS: 500.0000 mg | ORAL_TABLET | Freq: Two times a day (BID) | ORAL | 0 refills | Status: AC

## 2018-11-19 NOTE — Progress Notes (Signed)
Melodie Bouillon, MD [address]  [address]  [place], [place] [address]  Main: [phone]  Fax: [phone]   Primary Care Physician: Charlott Rakes, MD   Chief Complaint  Patient presents with  . Follow-up    Grade D Espohagitis, hx h pylori ulcer    HPI: [name] is a 63 y.o. male here for follow-up of esophagitis. The patient denies abdominal or flank pain, anorexia, nausea or vomiting, dysphagia, change in bowel habits or black or bloody stools or weight loss.  Patient underwent upper endoscopy on November 06, 2018.  Which showed grade C esophagitis compared to grade D seen previously.  Gastric biopsies show H. Pylori.  Patient taking Meprazole 40 mg twice daily and continues to have esophagitis despite that.  Hiatal hernia also seen previously.  Patient was referred to surgery but this appointment has not been made by his living facility despite repeated recommendations for the same.    Current Outpatient Medications  Medication Sig Dispense Refill  . acetaminophen (TYLENOL) 650 MG suppository Place 650 mg rectally every 4 (four) hours as needed.    Marland Kitchen amLODipine (NORVASC) 10 MG tablet Take 1 tablet (10 mg total) by mouth daily.    Marland Kitchen aspirin EC 81 MG EC tablet Take 1 tablet (81 mg total) by mouth daily.    Marland Kitchen atorvastatin (LIPITOR) 40 MG tablet Take 1 tablet (40 mg total) by mouth daily at 6 PM. (Patient taking differently: Take 40 mg daily by mouth. ) 30 tablet 0  . cloNIDine (CATAPRES) 0.2 MG tablet Take 1 tablet (0.2 mg total) by mouth 2 (two) times daily. 60 tablet 11  . ferrous sulfate 325 (65 FE) MG tablet Take 1 tablet (325 mg total) 3 (three) times daily with meals by mouth. 90 tablet 0  . hydrALAZINE (APRESOLINE) 100 MG tablet Take 100 mg by mouth 3 (three) times daily.    . hydrochlorothiazide (HYDRODIURIL) 25 MG tablet Take 1 tablet (25 mg total) by mouth daily. 30 tablet 0  . hydrOXYzine (ATARAX/VISTARIL) 25 MG tablet Take 1 tablet (25 mg  total) by mouth every 6 (six) hours as needed for anxiety. 20 tablet 0  . metroNIDAZOLE (FLAGYL) 250 MG tablet Take 1 tablet (250 mg total) by mouth 4 (four) times daily. 30 tablet 0  . ondansetron (ZOFRAN ODT) 4 MG disintegrating tablet Take 1 tablet (4 mg total) every 8 (eight) hours as needed by mouth for nausea or vomiting. 20 tablet 0  . ondansetron (ZOFRAN) 4 MG tablet     . phenytoin (DILANTIN) 300 MG ER capsule Take 1 capsule (300 mg total) by mouth at bedtime.    . polyethylene glycol (MIRALAX / GLYCOLAX) packet Take 17 g by mouth daily. (Patient taking differently: Take 17 g daily as needed by mouth. ) 14 each 0  . QUEtiapine (SEROQUEL) 25 MG tablet Take 1 tablet (25 mg total) by mouth at bedtime.    . traZODone (DESYREL) 50 MG tablet Take 1 tablet (50 mg total) by mouth at bedtime.    Marland Kitchen amoxicillin (AMOXIL) 500 MG tablet Take 1 tablet (500 mg total) by mouth 2 (two) times daily for 14 days. 28 tablet 0  . levofloxacin (LEVAQUIN) 500 MG tablet Take 1 tablet (500 mg total) by mouth daily for 14 days. 14 tablet 0  . omeprazole (PRILOSEC) 40 MG capsule Take 1 capsule (40 mg total) by mouth 2 (two) times daily before a meal. 60 capsule 2   No current facility-administered  medications for this visit.     Allergies as of 11/19/2018 - Review Complete 11/19/2018  Allergen Reaction Noted  . Iodine Anaphylaxis and Itching 04/16/2017  . Shellfish allergy Hives and Swelling 02/13/2017    ROS:  General: Negative for anorexia, weight loss, fever, chills, fatigue, weakness. ENT: Negative for hoarseness, difficulty swallowing , nasal congestion. CV: Negative for chest pain, angina, palpitations, dyspnea on exertion, peripheral edema.  Respiratory: Negative for dyspnea at rest, dyspnea on exertion, cough, sputum, wheezing.  GI: See history of present illness. GU:  Negative for dysuria, hematuria, urinary incontinence, urinary frequency, nocturnal urination.  Endo: Negative for unusual weight  change.    Physical Examination:   BP 129/77   Pulse 70   General: Well-nourished, well-developed in no acute distress.  Eyes: No icterus. Conjunctivae pink. Mouth: Oropharyngeal mucosa moist and pink , no lesions erythema or exudate. Neck: Supple, Trachea midline Abdomen: Bowel sounds are normal, nontender, nondistended, no hepatosplenomegaly or masses, no abdominal bruits or hernia , no rebound or guarding.   Extremities: No lower extremity edema. No clubbing or deformities. Neuro: Alert and oriented x 3.  Grossly intact. Skin: Warm and dry, no jaundice.   Psych: Alert and cooperative, normal mood and affect.   Labs: CMP     Component Value Date/Time   NA 133 (L) 09/26/2017 0433   NA 135 (L) 10/11/2013 1047   K 3.9 09/26/2017 0433   K 3.6 10/11/2013 1047   CL 98 (L) 09/26/2017 0433   CL 105 10/11/2013 1047   CO2 27 09/26/2017 0433   CO2 25 10/11/2013 1047   GLUCOSE 111 (H) 09/26/2017 0433   GLUCOSE 119 (H) 10/11/2013 1047   BUN 32 (H) 09/26/2017 0433   BUN 16 10/11/2013 1047   CREATININE 1.55 (H) 09/26/2017 0433   CREATININE 1.23 10/11/2013 1047   CALCIUM 9.2 09/26/2017 0433   CALCIUM 9.0 10/11/2013 1047   PROT 7.5 09/21/2017 2211   PROT 7.9 10/11/2013 1047   ALBUMIN 3.5 09/21/2017 2211   ALBUMIN 3.6 10/11/2013 1047   AST 43 (H) 09/21/2017 2211   AST 43 (H) 10/11/2013 1047   ALT 44 09/21/2017 2211   ALT 39 10/11/2013 1047   ALKPHOS 189 (H) 09/21/2017 2211   ALKPHOS 139 (H) 10/11/2013 1047   BILITOT 0.7 09/21/2017 2211   BILITOT 0.4 10/11/2013 1047   GFRNONAA 47 (L) 09/26/2017 0433   GFRNONAA >60 10/11/2013 1047   GFRAA 54 (L) 09/26/2017 0433   GFRAA >60 10/11/2013 1047   Lab Results  Component Value Date   WBC 5.8 09/26/2017   HGB 13.6 09/26/2017   HCT 42.0 09/26/2017   MCV 84.7 09/26/2017   PLT 190 09/26/2017    Imaging Studies: No results found.  Assessment and Plan:   SEQUAN KUO is a 63 y.o. y/o male here for follow-up of grade D  esophagitis and H. pylori  Patient has previously been treated by a quadruple therapy for H. pylori and continues to have persistent H. pylori on repeat biopsies Levofloxacin triple therapy has therefore been prescribed at this time  Continue full dose PPI due to esophagitis Patient social worker is present today and states she is willing to call the surgeon to make the referral appointment.  Therefore referral was placed again due to patient's hiatal hernia causing esophagitis which led to his initial GI bleed months ago.  No further bleeding present.  Exam patient will follow-up in 6 to 8 weeks to obtain H. pylori eradication testing  Continue antireflux measures Written instructions sent to his living facility include instructions to keep head of bed elevated to 30 degrees at all times  Patient continues to refuse colonoscopy for CRC screening His last colonoscopy done for iron deficiency anemia did not show any large lesions, but due to the poor prep, small polyps or lesions cannot be ruled out.  This was discussed with patient and continues to refuse colonoscopy despite risks associated with not getting another colonoscopy at this time. Written instructions sent to the facility to consider alternative testing such as Cologuard testing.       Dr Melodie Bouillon

## 2018-11-21 DIAGNOSIS — G4701 Insomnia due to medical condition: Secondary | ICD-10-CM | POA: Diagnosis not present

## 2018-11-21 DIAGNOSIS — F0150 Vascular dementia without behavioral disturbance: Secondary | ICD-10-CM | POA: Diagnosis not present

## 2018-12-02 ENCOUNTER — Ambulatory Visit (INDEPENDENT_AMBULATORY_CARE_PROVIDER_SITE_OTHER): Payer: Medicare Other | Admitting: Surgery

## 2018-12-02 ENCOUNTER — Other Ambulatory Visit: Payer: Self-pay

## 2018-12-02 ENCOUNTER — Encounter: Payer: Self-pay | Admitting: Surgery

## 2018-12-02 VITALS — BP 145/92 | HR 76 | Temp 97.9°F | Ht 74.0 in | Wt 180.0 lb

## 2018-12-02 DIAGNOSIS — K449 Diaphragmatic hernia without obstruction or gangrene: Secondary | ICD-10-CM | POA: Diagnosis not present

## 2018-12-02 NOTE — Patient Instructions (Signed)
Return as needed.The patient is aware to call back for any questions or concerns.  

## 2018-12-02 NOTE — Progress Notes (Signed)
Patient ID: [name], male   DOB: Mar 10, 1956, 63 y.o.   MRN: [id number]  HPI [name] is a 63 y.o. male seen at the request of Dr. Maximino Greenland.  He had a recent EGD by her that I have personally reviewed showing evidence of a hiatal hernia and esophagitis.  Apparently the patient has some difficulty swallowing and complains of reflux.  Patient has pretty significant sequela from his stroke and is only able to answer very simple questions he follows commands but is on a wheelchair. Currently accompanied by the social worker from the nursing home.  There is no family readily available and the history is taking from both the chart and the Child psychotherapist. Does require significant assistance with ADLs but he is able to eat with assistance. Fevers no chills.  He has refused a colonoscopy in the past. Family is not very close and apparently daughters come sporadically to the nursing home. Does have a CMP showing evidence of a creatinine of 1.55 baseline CBC is normal. Have significant history of hypertension, epilepsy and a previous stroke.  HPI  Past Medical History:  Diagnosis Date  . Hypertension   . Seizures (HCC)   . Stroke [facility])     Past Surgical History:  Procedure Laterality Date  . blind    . CLOSED REDUCTION CRANIOFACIAL SEPARATION    . COLONOSCOPY N/A 08/02/2017   Procedure: COLONOSCOPY;  Surgeon: Pasty Spillers, MD;  Location: [facility] ENDOSCOPY;  Service: Endoscopy;  Laterality: N/A;  . ESOPHAGOGASTRODUODENOSCOPY (EGD) WITH PROPOFOL N/A 07/31/2017   Procedure: ESOPHAGOGASTRODUODENOSCOPY (EGD) WITH PROPOFOL;  Surgeon: Pasty Spillers, MD;  Location: [facility] ENDOSCOPY;  Service: Endoscopy;  Laterality: N/A;  . ESOPHAGOGASTRODUODENOSCOPY (EGD) WITH PROPOFOL N/A 01/30/2018   Procedure: ESOPHAGOGASTRODUODENOSCOPY (EGD) WITH PROPOFOL;  Surgeon: Pasty Spillers, MD;  Location: [facility] ENDOSCOPY;  Service: Endoscopy;  Laterality: N/A;  . ESOPHAGOGASTRODUODENOSCOPY (EGD) WITH  PROPOFOL N/A 11/06/2018   Procedure: ESOPHAGOGASTRODUODENOSCOPY (EGD) WITH PROPOFOL;  Surgeon: Pasty Spillers, MD;  Location: [facility] ENDOSCOPY;  Service: Endoscopy;  Laterality: N/A;  . LUNG REMOVAL, PARTIAL Right     Family History  Problem Relation Age of Onset  . Hypertension Mother   . CAD Mother     Social History Social History   Tobacco Use  . Smoking status: Former Games developer  . Smokeless tobacco: Never Used  Substance Use Topics  . Alcohol use: No  . Drug use: No    Allergies  Allergen Reactions  . Iodine Anaphylaxis and Itching  . Shellfish Allergy Hives and Swelling    Current Outpatient Medications  Medication Sig Dispense Refill  . acetaminophen (TYLENOL) 650 MG suppository Place 650 mg rectally every 4 (four) hours as needed.    Marland Kitchen amLODipine (NORVASC) 10 MG tablet Take 1 tablet (10 mg total) by mouth daily.    Marland Kitchen amoxicillin (AMOXIL) 500 MG tablet Take 1 tablet (500 mg total) by mouth 2 (two) times daily for 14 days. 28 tablet 0  . aspirin EC 81 MG EC tablet Take 1 tablet (81 mg total) by mouth daily.    Marland Kitchen atorvastatin (LIPITOR) 40 MG tablet Take 1 tablet (40 mg total) by mouth daily at 6 PM. (Patient taking differently: Take 40 mg daily by mouth. ) 30 tablet 0  . cloNIDine (CATAPRES) 0.2 MG tablet Take 1 tablet (0.2 mg total) by mouth 2 (two) times daily. 60 tablet 11  . ferrous sulfate 325 (65 FE) MG tablet Take 1 tablet (325 mg total) 3 (three)  times daily with meals by mouth. 90 tablet 0  . hydrALAZINE (APRESOLINE) 100 MG tablet Take 100 mg by mouth 3 (three) times daily.    . hydrochlorothiazide (HYDRODIURIL) 25 MG tablet Take 1 tablet (25 mg total) by mouth daily. 30 tablet 0  . hydrOXYzine (ATARAX/VISTARIL) 25 MG tablet Take 1 tablet (25 mg total) by mouth every 6 (six) hours as needed for anxiety. 20 tablet 0  . levofloxacin (LEVAQUIN) 500 MG tablet Take 1 tablet (500 mg total) by mouth daily for 14 days. 14 tablet 0  . metroNIDAZOLE (FLAGYL) 250 MG tablet  Take 1 tablet (250 mg total) by mouth 4 (four) times daily. 30 tablet 0  . ondansetron (ZOFRAN ODT) 4 MG disintegrating tablet Take 1 tablet (4 mg total) every 8 (eight) hours as needed by mouth for nausea or vomiting. 20 tablet 0  . ondansetron (ZOFRAN) 4 MG tablet     . phenytoin (DILANTIN) 300 MG ER capsule Take 1 capsule (300 mg total) by mouth at bedtime.    . polyethylene glycol (MIRALAX / GLYCOLAX) packet Take 17 g by mouth daily. (Patient taking differently: Take 17 g daily as needed by mouth. ) 14 each 0  . QUEtiapine (SEROQUEL) 25 MG tablet Take 1 tablet (25 mg total) by mouth at bedtime.    . traZODone (DESYREL) 50 MG tablet Take 1 tablet (50 mg total) by mouth at bedtime.    Marland Kitchen omeprazole (PRILOSEC) 40 MG capsule Take 1 capsule (40 mg total) by mouth 2 (two) times daily before a meal. 60 capsule 2   No current facility-administered medications for this visit.      Review of Systems Full ROS  was UNABLE to be obtained due to the patient's neurological condition and prior stroke Physical Exam Blood pressure (!) 145/92, pulse 76, temperature 97.9 F (36.6 C), temperature source Skin, height  (1.88 m), weight 180 lb (81.6 kg), SpO2 90 %. CONSTITUTIONAL: Dilatated and malnourished male in a wheelchair. EARS, NOSE, MOUTH AND THROAT: The oropharynx is clear. The oral mucosa is pink and moist. Hearing is intact to voice. NECK: supple no masses, no LAD, NO JVD LYMPH NODES:  Lymph nodes in the neck are normal. RESPIRATORY:  Lungs are clear. There is normal respiratory effort, with equal breath sounds bilaterally, and without pathologic use of accessory muscles. CARDIOVASCULAR: Heart is regular without murmurs, gallops, or rubs. GI: The abdomen is soft, nontender, and nondistended. There are no palpable masses. There is no hepatosplenomegaly. There are normal bowel sounds in all quadrants. GU: Rectal deferred.   MUSCULOSKELETAL: Normal muscle strength and tone. No cyanosis or edema.    SKIN: Turgor is good and there are no pathologic skin lesions or ulcers. NEUROLOGIC: He is in a wheelchair.  He is able to show me 2 fingers and stick his tongue out.  He answers very simple questions.  Because of his prior neurological condition it is impossible to evaluate thought process and further mentation.  His affect is preserved.  Data Reviewed  I have personally reviewed the patient's imaging, laboratory findings and medical records.    Assessment/Plan 63 year old debilitated male in a wheelchair with a hiatal hernia on GERD. Due to  his overall condition of malnutrition and significant debility I do not recommend surgical intervention.  I Suggest optimization of medical therapy.  Patient understands and is in agreement.  Also have discussed the case in detail with his social worker that is in complete agreement with me.  There is no  immediate family available. Copy of this report was sent to the referring provider   Sterling Big, MD FACS General Surgeon 12/02/2018, 11:10 AM

## 2018-12-06 DIAGNOSIS — F0390 Unspecified dementia without behavioral disturbance: Secondary | ICD-10-CM | POA: Diagnosis not present

## 2018-12-06 DIAGNOSIS — L209 Atopic dermatitis, unspecified: Secondary | ICD-10-CM | POA: Diagnosis not present

## 2018-12-19 DIAGNOSIS — F0150 Vascular dementia without behavioral disturbance: Secondary | ICD-10-CM | POA: Diagnosis not present

## 2018-12-19 DIAGNOSIS — G4701 Insomnia due to medical condition: Secondary | ICD-10-CM | POA: Diagnosis not present

## 2018-12-26 DIAGNOSIS — G4089 Other seizures: Secondary | ICD-10-CM | POA: Diagnosis not present

## 2018-12-31 DIAGNOSIS — F0150 Vascular dementia without behavioral disturbance: Secondary | ICD-10-CM | POA: Diagnosis not present

## 2018-12-31 DIAGNOSIS — G4701 Insomnia due to medical condition: Secondary | ICD-10-CM | POA: Diagnosis not present

## 2019-01-01 DIAGNOSIS — N183 Chronic kidney disease, stage 3 (moderate): Secondary | ICD-10-CM | POA: Diagnosis not present

## 2019-01-01 DIAGNOSIS — F0390 Unspecified dementia without behavioral disturbance: Secondary | ICD-10-CM | POA: Diagnosis not present

## 2019-01-01 DIAGNOSIS — E785 Hyperlipidemia, unspecified: Secondary | ICD-10-CM | POA: Diagnosis not present

## 2019-01-01 DIAGNOSIS — G40909 Epilepsy, unspecified, not intractable, without status epilepticus: Secondary | ICD-10-CM | POA: Diagnosis not present

## 2019-01-02 ENCOUNTER — Ambulatory Visit: Payer: Medicare Other | Admitting: Gastroenterology

## 2019-01-02 ENCOUNTER — Ambulatory Visit (INDEPENDENT_AMBULATORY_CARE_PROVIDER_SITE_OTHER): Payer: Medicare Other | Admitting: Gastroenterology

## 2019-01-02 DIAGNOSIS — Z5329 Procedure and treatment not carried out because of patient's decision for other reasons: Secondary | ICD-10-CM

## 2019-01-03 DIAGNOSIS — F0150 Vascular dementia without behavioral disturbance: Secondary | ICD-10-CM | POA: Diagnosis not present

## 2019-01-03 DIAGNOSIS — Z0000 Encounter for general adult medical examination without abnormal findings: Secondary | ICD-10-CM | POA: Diagnosis not present

## 2019-01-03 DIAGNOSIS — E785 Hyperlipidemia, unspecified: Secondary | ICD-10-CM | POA: Diagnosis not present

## 2019-01-03 DIAGNOSIS — R4182 Altered mental status, unspecified: Secondary | ICD-10-CM | POA: Diagnosis not present

## 2019-01-03 DIAGNOSIS — F0390 Unspecified dementia without behavioral disturbance: Secondary | ICD-10-CM | POA: Diagnosis not present

## 2019-01-03 DIAGNOSIS — Z139 Encounter for screening, unspecified: Secondary | ICD-10-CM | POA: Diagnosis not present

## 2019-01-03 DIAGNOSIS — G4089 Other seizures: Secondary | ICD-10-CM | POA: Diagnosis not present

## 2019-01-03 DIAGNOSIS — Z1331 Encounter for screening for depression: Secondary | ICD-10-CM | POA: Diagnosis not present

## 2019-01-08 ENCOUNTER — Telehealth: Payer: Self-pay | Admitting: Gastroenterology

## 2019-01-08 NOTE — Telephone Encounter (Signed)
I have called l/m for [name] ([phone]) asking her to call so we may schedule an appointment.

## 2019-01-15 ENCOUNTER — Telehealth: Payer: Self-pay | Admitting: Gastroenterology

## 2019-01-15 NOTE — Telephone Encounter (Signed)
I have left another message asking her to call the office so we may schedule the patient .

## 2019-01-16 DIAGNOSIS — F0150 Vascular dementia without behavioral disturbance: Secondary | ICD-10-CM | POA: Diagnosis not present

## 2019-01-16 DIAGNOSIS — G4701 Insomnia due to medical condition: Secondary | ICD-10-CM | POA: Diagnosis not present

## 2019-02-03 DIAGNOSIS — R569 Unspecified convulsions: Secondary | ICD-10-CM | POA: Diagnosis not present

## 2019-02-03 DIAGNOSIS — G40909 Epilepsy, unspecified, not intractable, without status epilepticus: Secondary | ICD-10-CM | POA: Diagnosis not present

## 2019-02-03 DIAGNOSIS — E785 Hyperlipidemia, unspecified: Secondary | ICD-10-CM | POA: Diagnosis not present

## 2019-02-03 DIAGNOSIS — F0390 Unspecified dementia without behavioral disturbance: Secondary | ICD-10-CM | POA: Diagnosis not present

## 2019-03-12 ENCOUNTER — Ambulatory Visit: Payer: Medicare Other | Admitting: Gastroenterology

## 2019-04-03 ENCOUNTER — Ambulatory Visit (INDEPENDENT_AMBULATORY_CARE_PROVIDER_SITE_OTHER): Payer: Medicare Other | Admitting: Gastroenterology

## 2019-04-03 ENCOUNTER — Encounter: Payer: Self-pay | Admitting: Gastroenterology

## 2019-04-03 ENCOUNTER — Other Ambulatory Visit: Payer: Self-pay

## 2019-04-03 VITALS — BP 148/90 | HR 72 | Temp 98.2°F | Ht 74.0 in

## 2019-04-03 DIAGNOSIS — K2100 Gastro-esophageal reflux disease with esophagitis, without bleeding: Secondary | ICD-10-CM

## 2019-04-03 DIAGNOSIS — Z8619 Personal history of other infectious and parasitic diseases: Secondary | ICD-10-CM | POA: Diagnosis not present

## 2019-04-03 DIAGNOSIS — K209 Esophagitis, unspecified without bleeding: Secondary | ICD-10-CM

## 2019-04-03 NOTE — Progress Notes (Signed)
Melodie BouillonVarnita Brinnley Lacap, MD [address]  [address]  [place], [place] [address]  Main: [phone]  Fax: [phone]   Primary Care Physician: Charlott RakesHodges, Francisco, MD   Chief Complaint  Patient presents with  . Follow-up h pylori ulcer    HPI: [name] is a 63 y.o. male here for follow-up of esophagitis and history of H. Pylori.  The patient denies abdominal or flank pain, anorexia, nausea or vomiting, dysphagia, change in bowel habits or black or bloody stools or weight loss. Patient presents with [facility] facility staff who helps provide history as well.  Patient underwent upper endoscopy on November 06, 2018.  Which showed grade C esophagitis compared to grade D seen previously.  Gastric biopsies showed H. Pylori again and patient was treated with levofloxacin triple therapy this time.  Patient taking OMeprazole 40 mg daily and continues to have esophagitis despite that.  Hiatal hernia also seen previously.  Patient was referred to Dr. Cathey EndowBowen and he did not recommend surgery due to patient's comorbidities  Patient refused CRC screening in the past  Current Outpatient Medications  Medication Sig Dispense Refill  . acetaminophen (TYLENOL) 650 MG suppository Place 650 mg rectally every 4 (four) hours as needed.    Marland Kitchen. amLODipine (NORVASC) 10 MG tablet Take 1 tablet (10 mg total) by mouth daily.    Marland Kitchen. aspirin EC 81 MG EC tablet Take 1 tablet (81 mg total) by mouth daily.    Marland Kitchen. atorvastatin (LIPITOR) 40 MG tablet Take 1 tablet (40 mg total) by mouth daily at 6 PM. (Patient taking differently: Take 40 mg daily by mouth. ) 30 tablet 0  . cloNIDine (CATAPRES) 0.2 MG tablet Take 1 tablet (0.2 mg total) by mouth 2 (two) times daily. 60 tablet 11  . ferrous sulfate 325 (65 FE) MG tablet Take 1 tablet (325 mg total) 3 (three) times daily with meals by mouth. 90 tablet 0  . hydrALAZINE (APRESOLINE) 100 MG tablet Take 100 mg by mouth 3 (three) times daily.    Marland Kitchen. loratadine (CLARITIN) 10 MG  tablet Take 10 mg by mouth daily.    . phenytoin (DILANTIN) 300 MG ER capsule Take 1 capsule (300 mg total) by mouth at bedtime.    . polyethylene glycol (MIRALAX / GLYCOLAX) packet Take 17 g by mouth daily. (Patient taking differently: Take 17 g daily as needed by mouth. ) 14 each 0  . traZODone (DESYREL) 50 MG tablet Take 1 tablet (50 mg total) by mouth at bedtime.    . hydrochlorothiazide (HYDRODIURIL) 25 MG tablet Take 1 tablet (25 mg total) by mouth daily. (Patient not taking: Reported on 04/03/2019) 30 tablet 0  . hydrOXYzine (ATARAX/VISTARIL) 25 MG tablet Take 1 tablet (25 mg total) by mouth every 6 (six) hours as needed for anxiety. (Patient not taking: Reported on 04/03/2019) 20 tablet 0  . omeprazole (PRILOSEC) 40 MG capsule Take 1 capsule (40 mg total) by mouth 2 (two) times daily before a meal. 60 capsule 2  . ondansetron (ZOFRAN ODT) 4 MG disintegrating tablet Take 1 tablet (4 mg total) every 8 (eight) hours as needed by mouth for nausea or vomiting. (Patient not taking: Reported on 04/03/2019) 20 tablet 0  . ondansetron (ZOFRAN) 4 MG tablet     . QUEtiapine (SEROQUEL) 25 MG tablet Take 1 tablet (25 mg total) by mouth at bedtime. (Patient not taking: Reported on 04/03/2019)     No current facility-administered medications for this visit.     Allergies  as of 04/03/2019 - Review Complete 04/03/2019  Allergen Reaction Noted  . Iodine Anaphylaxis and Itching 04/16/2017  . Shellfish allergy Hives and Swelling 02/13/2017    ROS:  General: Negative for anorexia, weight loss, fever, chills, fatigue, weakness. ENT: Negative for hoarseness, difficulty swallowing , nasal congestion. CV: Negative for chest pain, angina, palpitations, dyspnea on exertion, peripheral edema.  Respiratory: Negative for dyspnea at rest, dyspnea on exertion, cough, sputum, wheezing.  GI: See history of present illness. GU:  Negative for dysuria, hematuria, urinary incontinence, urinary frequency, nocturnal urination.   Endo: Negative for unusual weight change.    Physical Examination:   BP (!) 148/90   Pulse 72   Temp 98.2 F (36.8 C) (Oral)   Ht 6\' 2"  (1.88 m)   BMI 23.11 kg/m   General: Well-nourished, well-developed in no acute distress.  Eyes: No icterus. Conjunctivae pink. Mouth: Oropharyngeal mucosa moist and pink , no lesions erythema or exudate. Neck: Supple, Trachea midline Abdomen: Bowel sounds are normal, nontender, nondistended, no hepatosplenomegaly or masses, no abdominal bruits or hernia , no rebound or guarding.   Extremities: No lower extremity edema. No clubbing or deformities. Neuro: Alert and oriented x 3.  Grossly intact. Skin: Warm and dry, no jaundice.   Psych: Alert and cooperative, normal mood and affect.   Labs: CMP     Component Value Date/Time   NA 133 (L) 09/26/2017 0433   NA 135 (L) 10/11/2013 1047   K 3.9 09/26/2017 0433   K 3.6 10/11/2013 1047   CL 98 (L) 09/26/2017 0433   CL 105 10/11/2013 1047   CO2 27 09/26/2017 0433   CO2 25 10/11/2013 1047   GLUCOSE 111 (H) 09/26/2017 0433   GLUCOSE 119 (H) 10/11/2013 1047   BUN 32 (H) 09/26/2017 0433   BUN 16 10/11/2013 1047   CREATININE 1.55 (H) 09/26/2017 0433   CREATININE 1.23 10/11/2013 1047   CALCIUM 9.2 09/26/2017 0433   CALCIUM 9.0 10/11/2013 1047   PROT 7.5 09/21/2017 2211   PROT 7.9 10/11/2013 1047   ALBUMIN 3.5 09/21/2017 2211   ALBUMIN 3.6 10/11/2013 1047   AST 43 (H) 09/21/2017 2211   AST 43 (H) 10/11/2013 1047   ALT 44 09/21/2017 2211   ALT 39 10/11/2013 1047   ALKPHOS 189 (H) 09/21/2017 2211   ALKPHOS 139 (H) 10/11/2013 1047   BILITOT 0.7 09/21/2017 2211   BILITOT 0.4 10/11/2013 1047   GFRNONAA 47 (L) 09/26/2017 0433   GFRNONAA >60 10/11/2013 1047   GFRAA 54 (L) 09/26/2017 0433   GFRAA >60 10/11/2013 1047   Lab Results  Component Value Date   WBC 5.8 09/26/2017   HGB 13.6 09/26/2017   HCT 42.0 09/26/2017   MCV 84.7 09/26/2017   PLT 190 09/26/2017    Imaging Studies: No results  found.  Assessment and Plan:   [name] is a 63 y.o. y/o male with previous history of grade D esophagitis, which improved to grade C esophagitis on February 2020 procedure, and history of recurrent H. Pylori  Initial treatment with H. pylori quadruple therapy Repeat treatment with levofloxacin triple therapy completed in February 2020  Repeat EGD indicated to evaluate if esophagitis is healed and repeat biopsies for H. Pylori  I have discussed alternative options, risks & benefits,  which include, but are not limited to, bleeding, infection, perforation,respiratory complication & drug reaction.  The patient agrees with this plan & written consent will be obtained.    Need for CRC screening discussed again  Patient refuses colonoscopy after discussing risks and benefits of procedure I also discussed alternative screening methods such as stool testing.  However, patient states that if stool testing is positive still would not want a colonoscopy done and therefore is refusing stool testing for CRC screening as well. Patient understands the risk of underlying malignancy if CRC screening is not done and refuses the testing at this point  Continue antireflux measures  His last colonoscopy done for iron deficiency anemia did not show any large lesions, but due to the poor prep, small polyps or lesions cannot be ruled out.   Dr Vonda Antigua

## 2019-05-05 ENCOUNTER — Other Ambulatory Visit: Admission: RE | Admit: 2019-05-05 | Payer: Medicare Other | Source: Ambulatory Visit

## 2019-05-06 ENCOUNTER — Telehealth: Payer: Self-pay

## 2019-05-06 NOTE — Telephone Encounter (Signed)
Alexis Education officer, museum for pt has contacted office to reschedule patients EGD from 08/13 to 08/20.  Trish in Endoscopy has been made aware of this change.  Thanks [employer]

## 2019-05-12 ENCOUNTER — Other Ambulatory Visit: Payer: Self-pay

## 2019-05-12 ENCOUNTER — Other Ambulatory Visit
Admission: RE | Admit: 2019-05-12 | Discharge: 2019-05-12 | Disposition: A | Discharge status: Home / Self Care | Payer: Medicare Other | Source: Ambulatory Visit | Attending: Gastroenterology | Admitting: Gastroenterology

## 2019-05-12 DIAGNOSIS — Z01812 Encounter for preprocedural laboratory examination: Secondary | ICD-10-CM | POA: Diagnosis present

## 2019-05-12 DIAGNOSIS — Z20828 Contact with and (suspected) exposure to other viral communicable diseases: Secondary | ICD-10-CM | POA: Diagnosis not present

## 2019-05-13 LAB — SARS CORONAVIRUS 2 (TAT 6-24 HRS): SARS Coronavirus 2: NEGATIVE

## 2019-05-14 ENCOUNTER — Encounter: Payer: Self-pay | Admitting: *Deleted

## 2019-05-15 ENCOUNTER — Ambulatory Visit: Admission: RE | Admit: 2019-05-15 | Payer: Medicare Other | Source: Home / Self Care | Admitting: Gastroenterology

## 2019-05-15 ENCOUNTER — Encounter: Admission: RE | Payer: Self-pay | Source: Home / Self Care

## 2019-05-15 ENCOUNTER — Other Ambulatory Visit: Payer: Self-pay

## 2019-05-15 DIAGNOSIS — K209 Esophagitis, unspecified without bleeding: Secondary | ICD-10-CM

## 2019-05-15 SURGERY — ESOPHAGOGASTRODUODENOSCOPY (EGD) WITH PROPOFOL
Anesthesia: General

## 2019-05-20 DIAGNOSIS — D509 Iron deficiency anemia, unspecified: Secondary | ICD-10-CM | POA: Diagnosis not present

## 2019-05-20 DIAGNOSIS — Z79899 Other long term (current) drug therapy: Secondary | ICD-10-CM | POA: Diagnosis not present

## 2019-06-10 ENCOUNTER — Other Ambulatory Visit: Admission: RE | Admit: 2019-06-10 | Payer: Medicare Other | Source: Ambulatory Visit

## 2019-06-13 ENCOUNTER — Other Ambulatory Visit: Payer: Self-pay

## 2019-06-13 ENCOUNTER — Ambulatory Visit: Payer: Medicare Other | Admitting: Anesthesiology

## 2019-06-13 ENCOUNTER — Other Ambulatory Visit
Admission: RE | Admit: 2019-06-13 | Discharge: 2019-06-13 | Disposition: A | Discharge status: Home / Self Care | Payer: Medicare Other | Source: Ambulatory Visit | Attending: Gastroenterology | Admitting: Gastroenterology

## 2019-06-13 ENCOUNTER — Encounter: Payer: Self-pay | Admitting: Anesthesiology

## 2019-06-13 ENCOUNTER — Encounter
Admission: RE | Disposition: A | Discharge status: Home / Self Care | Payer: Self-pay | Source: Home / Self Care | Attending: Gastroenterology

## 2019-06-13 ENCOUNTER — Ambulatory Visit
Admission: RE | Admit: 2019-06-13 | Discharge: 2019-06-13 | Disposition: A | Discharge status: Home / Self Care | Payer: Medicare Other | Attending: Gastroenterology | Admitting: Gastroenterology

## 2019-06-13 DIAGNOSIS — K3189 Other diseases of stomach and duodenum: Secondary | ICD-10-CM

## 2019-06-13 DIAGNOSIS — Z20828 Contact with and (suspected) exposure to other viral communicable diseases: Secondary | ICD-10-CM | POA: Diagnosis not present

## 2019-06-13 DIAGNOSIS — Z79899 Other long term (current) drug therapy: Secondary | ICD-10-CM | POA: Insufficient documentation

## 2019-06-13 DIAGNOSIS — K2960 Other gastritis without bleeding: Secondary | ICD-10-CM | POA: Insufficient documentation

## 2019-06-13 DIAGNOSIS — Z87891 Personal history of nicotine dependence: Secondary | ICD-10-CM | POA: Insufficient documentation

## 2019-06-13 DIAGNOSIS — I10 Essential (primary) hypertension: Secondary | ICD-10-CM | POA: Insufficient documentation

## 2019-06-13 DIAGNOSIS — Z91041 Radiographic dye allergy status: Secondary | ICD-10-CM | POA: Insufficient documentation

## 2019-06-13 DIAGNOSIS — K449 Diaphragmatic hernia without obstruction or gangrene: Secondary | ICD-10-CM | POA: Insufficient documentation

## 2019-06-13 DIAGNOSIS — D649 Anemia, unspecified: Secondary | ICD-10-CM | POA: Insufficient documentation

## 2019-06-13 DIAGNOSIS — K209 Esophagitis, unspecified without bleeding: Secondary | ICD-10-CM

## 2019-06-13 DIAGNOSIS — K210 Gastro-esophageal reflux disease with esophagitis: Secondary | ICD-10-CM | POA: Diagnosis not present

## 2019-06-13 DIAGNOSIS — Z8249 Family history of ischemic heart disease and other diseases of the circulatory system: Secondary | ICD-10-CM | POA: Diagnosis not present

## 2019-06-13 DIAGNOSIS — Z91013 Allergy to seafood: Secondary | ICD-10-CM | POA: Insufficient documentation

## 2019-06-13 DIAGNOSIS — R569 Unspecified convulsions: Secondary | ICD-10-CM | POA: Diagnosis not present

## 2019-06-13 DIAGNOSIS — Z7982 Long term (current) use of aspirin: Secondary | ICD-10-CM | POA: Diagnosis not present

## 2019-06-13 DIAGNOSIS — K279 Peptic ulcer, site unspecified, unspecified as acute or chronic, without hemorrhage or perforation: Secondary | ICD-10-CM | POA: Insufficient documentation

## 2019-06-13 HISTORY — PX: ESOPHAGOGASTRODUODENOSCOPY (EGD) WITH PROPOFOL: SHX5813

## 2019-06-13 LAB — SARS CORONAVIRUS 2 BY RT PCR (HOSPITAL ORDER, PERFORMED IN ~~LOC~~ HOSPITAL LAB): SARS Coronavirus 2: NEGATIVE

## 2019-06-13 SURGERY — ESOPHAGOGASTRODUODENOSCOPY (EGD) WITH PROPOFOL
Anesthesia: General

## 2019-06-13 MED ORDER — LIDOCAINE HCL (PF) 2 % IJ SOLN
INTRAMUSCULAR | Status: DC | PRN
  Administered 2019-06-13: 80 mg via INTRADERMAL

## 2019-06-13 MED ORDER — PROPOFOL 10 MG/ML IV BOLUS
INTRAVENOUS | Status: DC | PRN
  Administered 2019-06-13: 50 mg via INTRAVENOUS

## 2019-06-13 MED ORDER — PROPOFOL 500 MG/50ML IV EMUL
INTRAVENOUS | Status: AC
  Filled 2019-06-13: qty 50

## 2019-06-13 MED ORDER — OMEPRAZOLE 40 MG PO CPDR: 40.0000 mg | DELAYED_RELEASE_CAPSULE | Freq: Two times a day (BID) | ORAL | 0 refills | Status: AC

## 2019-06-13 MED ORDER — LIDOCAINE HCL (PF) 2 % IJ SOLN
INTRAMUSCULAR | Status: AC
  Filled 2019-06-13: qty 10

## 2019-06-13 MED ORDER — PROPOFOL 500 MG/50ML IV EMUL
INTRAVENOUS | Status: DC | PRN
  Administered 2019-06-13: 100 ug/kg/min via INTRAVENOUS

## 2019-06-13 MED ORDER — SODIUM CHLORIDE 0.9 % IV SOLN
INTRAVENOUS | Status: DC
  Administered 2019-06-13: 1000 mL via INTRAVENOUS

## 2019-06-13 MED ORDER — FAMOTIDINE 20 MG PO TABS: 20.0000 mg | ORAL_TABLET | Freq: Every day | ORAL | 0 refills | Status: AC

## 2019-06-13 NOTE — Op Note (Addendum)
[facility] Gastroenterology Patient Name: Lori Liew Procedure Date: 06/13/2019 11:20 AM MRN: [id number] Account #: [id number] Date of Birth: [birth year] Admit Type: Outpatient Age: 63 Room: [facility] ENDO ROOM 2 Gender: Male Note Status: Finalized Procedure:            Upper GI endoscopy Indications:          Follow-up of esophagitis Providers:            Ryka Beighley B. Bonna Gains MD, MD Referring MD:         Maryella Shivers (Referring MD) Medicines:            Monitored Anesthesia Care Complications:        No immediate complications. Procedure:            Pre-Anesthesia Assessment:                       - The risks and benefits of the procedure and the                        sedation options and risks were discussed with the                        patient. All questions were answered and informed                        consent was obtained.                       - Patient identification and proposed procedure were                        verified prior to the procedure.                       - ASA Grade Assessment: III - A patient with severe                        systemic disease.                       After obtaining informed consent, the endoscope was                        passed under direct vision. Throughout the procedure,                        the patient's blood pressure, pulse, and oxygen                        saturations were monitored continuously. The Endoscope                        was introduced through the mouth, and advanced to the                        second part of duodenum. The upper GI endoscopy was                        accomplished with ease. The patient tolerated the  procedure well. Findings:      LA Grade D (one or more mucosal breaks involving at least 75% of       esophageal circumference) esophagitis with no bleeding was found in the       distal esophagus. Biopsies were taken with a cold forceps for  histology.      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      A large hiatal hernia was present.      The entire examined stomach was normal. Biopsies were taken with a cold       forceps for Helicobacter pylori testing.      Patchy mild mucosal changes characterized by discoloration were found in       the duodenal bulb and in the second portion of the duodenum. Biopsies       were taken with a cold forceps for histology.      The exam of the duodenum was otherwise normal. Impression:           - LA Grade D reflux esophagitis. Biopsied.                       - Ectopic gastric mucosa in the proximal esophagus.                       - Large hiatal hernia.                       - Normal stomach. Biopsied.                       - Mucosal changes in the duodenum. Biopsied. Recommendation:       - Follow an antireflux regimen.                       - Use Prilosec (omeprazole) 40 mg PO BID indefinitely.                        (Due to severe esophagitis despite treatment multiple                        times. Benefits of medication outweigh risks in this                        patient)                       - Use an H2 blocker at bedtime for 60 days.                       - Continue present medications.                       - The findings and recommendations were discussed with                        the patient.                       - The findings and recommendations were discussed with  the designated responsible adult.                       - Return to my office in 4 weeks.                       - Return to primary care physician in 4 weeks. Procedure Code(s):    --- Professional ---                       [phone], Esophagogastroduodenoscopy, flexible, transoral;                        with biopsy, single or multiple Diagnosis Code(s):    --- Professional ---                       K21.0,  Gastro-esophageal reflux disease with esophagitis                       K44.9, Diaphragmatic hernia without obstruction or                        gangrene                       K31.89, Other diseases of stomach and duodenum CPT copyright 2019 American Medical Association. All rights reserved. The codes documented in this report are preliminary and upon coder review may  be revised to meet current compliance requirements.  Melodie Bouillon, MD Michel Bickers B. Maximino Greenland MD, MD 06/13/2019 11:59:01 AM This report has been signed electronically. Number of Addenda: 0 Note Initiated On: 06/13/2019 11:20 AM Estimated Blood Loss: Estimated blood loss: none.      [facility]

## 2019-06-13 NOTE — Anesthesia Preprocedure Evaluation (Signed)
Anesthesia Evaluation  Patient identified by MRN, date of birth, ID band Patient awake    Reviewed: Allergy & Precautions, NPO status , Patient's Chart, lab work & pertinent test results, reviewed documented beta blocker date and time   Airway Mallampati: III  TM Distance: >3 FB     Dental  (+) Chipped   Pulmonary former smoker,           Cardiovascular hypertension, Pt. on medications      Neuro/Psych Seizures -,  CVA    GI/Hepatic hiatal hernia, PUD,   Endo/Other    Renal/GU      Musculoskeletal   Abdominal   Peds  Hematology  (+) anemia ,   Anesthesia Other Findings   Reproductive/Obstetrics                             Anesthesia Physical Anesthesia Plan  ASA: III  Anesthesia Plan: General   Post-op Pain Management:    Induction: Intravenous  PONV Risk Score and Plan:   Airway Management Planned:   Additional Equipment:   Intra-op Plan:   Post-operative Plan:   Informed Consent: I have reviewed the patients History and Physical, chart, labs and discussed the procedure including the risks, benefits and alternatives for the proposed anesthesia with the patient or authorized representative who has indicated his/her understanding and acceptance.       Plan Discussed with: CRNA  Anesthesia Plan Comments:         Anesthesia Quick Evaluation

## 2019-06-13 NOTE — Anesthesia Post-op Follow-up Note (Signed)
Anesthesia QCDR form completed.        

## 2019-06-13 NOTE — Transfer of Care (Signed)
Immediate Anesthesia Transfer of Care Note  Patient: [name]  Procedure(s) Performed: ESOPHAGOGASTRODUODENOSCOPY (EGD) WITH PROPOFOL (N/A )  Patient Location: PACU  Anesthesia Type:General  Level of Consciousness: awake and drowsy  Airway & Oxygen Therapy: Patient Spontanous Breathing  Post-op Assessment: Report given to RN and Post -op Vital signs reviewed and stable  Post vital signs: Reviewed and stable  Last Vitals:  Vitals Value Taken Time  BP 129/91 06/13/19 1148  Temp 37 C 06/13/19 1148  Pulse 88 06/13/19 1151  Resp 35 06/13/19 1151  SpO2 93 % 06/13/19 1151  Vitals shown include unvalidated device data.  Last Pain:  Vitals:   06/13/19 1148  TempSrc: Tympanic  PainSc: Asleep         Complications: No apparent anesthesia complications

## 2019-06-13 NOTE — H&P (Signed)
Vonda Antigua, MD [address] [place], [place], [place], [address] [address], [place], [place], [place], [address] Phone: [phone]  Fax: [phone]  Primary Care Physician:  Maryella Shivers, MD   Pre-Procedure History & Physical: HPI:  [name] is a 63 y.o. male is here for an EGD.   Past Medical History:  Diagnosis Date  . Hypertension   . Seizures ([address])   . Stroke [facility])     Past Surgical History:  Procedure Laterality Date  . blind    . CLOSED REDUCTION CRANIOFACIAL SEPARATION    . COLONOSCOPY N/A 08/02/2017   Procedure: COLONOSCOPY;  Surgeon: Virgel Manifold, MD;  Location: [facility] ENDOSCOPY;  Service: Endoscopy;  Laterality: N/A;  . ESOPHAGOGASTRODUODENOSCOPY (EGD) WITH PROPOFOL N/A 07/31/2017   Procedure: ESOPHAGOGASTRODUODENOSCOPY (EGD) WITH PROPOFOL;  Surgeon: Virgel Manifold, MD;  Location: [facility] ENDOSCOPY;  Service: Endoscopy;  Laterality: N/A;  . ESOPHAGOGASTRODUODENOSCOPY (EGD) WITH PROPOFOL N/A 01/30/2018   Procedure: ESOPHAGOGASTRODUODENOSCOPY (EGD) WITH PROPOFOL;  Surgeon: Virgel Manifold, MD;  Location: [facility] ENDOSCOPY;  Service: Endoscopy;  Laterality: N/A;  . ESOPHAGOGASTRODUODENOSCOPY (EGD) WITH PROPOFOL N/A 11/06/2018   Procedure: ESOPHAGOGASTRODUODENOSCOPY (EGD) WITH PROPOFOL;  Surgeon: Virgel Manifold, MD;  Location: [facility] ENDOSCOPY;  Service: Endoscopy;  Laterality: N/A;  . LUNG REMOVAL, PARTIAL Right    pt and social worker denies lung removal    Prior to Admission medications   Medication Sig Start Date End Date Taking? Authorizing Provider  acetaminophen (TYLENOL) 650 MG suppository Place 650 mg rectally every 4 (four) hours as needed.    [provider]  amLODipine (NORVASC) 10 MG tablet Take 1 tablet (10 mg total) by mouth daily. 09/27/17   Dustin Flock, MD  aspirin EC 81 MG EC tablet Take 1 tablet (81 mg total) by mouth daily. 02/16/17   Dustin Flock, MD  atorvastatin (LIPITOR) 40 MG tablet Take 1  tablet (40 mg total) by mouth daily at 6 PM. Patient taking differently: Take 40 mg daily by mouth.  02/15/17   Dustin Flock, MD  cloNIDine (CATAPRES) 0.2 MG tablet Take 1 tablet (0.2 mg total) by mouth 2 (two) times daily. 02/15/17   Dustin Flock, MD  ferrous sulfate 325 (65 FE) MG tablet Take 1 tablet (325 mg total) 3 (three) times daily with meals by mouth. 08/02/17   Demetrios Loll, MD  hydrALAZINE (APRESOLINE) 100 MG tablet Take 100 mg by mouth 3 (three) times daily. 04/19/17   [provider]  hydrochlorothiazide (HYDRODIURIL) 25 MG tablet Take 1 tablet (25 mg total) by mouth daily. Patient not taking: Reported on 04/03/2019 04/13/17   Fritzi Mandes, MD  hydrOXYzine (ATARAX/VISTARIL) 25 MG tablet Take 1 tablet (25 mg total) by mouth every 6 (six) hours as needed for anxiety. Patient not taking: Reported on 04/03/2019 09/05/17   Carrie Mew, MD  loratadine (CLARITIN) 10 MG tablet Take 10 mg by mouth daily.    [provider]  omeprazole (PRILOSEC) 40 MG capsule Take 1 capsule (40 mg total) by mouth 2 (two) times daily before a meal. 01/30/18 03/01/18  Virgel Manifold, MD  ondansetron (ZOFRAN ODT) 4 MG disintegrating tablet Take 1 tablet (4 mg total) every 8 (eight) hours as needed by mouth for nausea or vomiting. Patient not taking: Reported on 04/03/2019 08/11/17   Lavonia Drafts, MD  ondansetron [facility]) 4 MG tablet  01/14/18   [provider]  phenytoin (DILANTIN) 300 MG ER capsule Take 1 capsule (300 mg total) by mouth at bedtime. 09/26/17  Auburn BilberryPatel, Shreyang, MD  polyethylene glycol [facility](MIRALAX / GLYCOLAX) packet Take 17 g by mouth daily. Patient taking differently: Take 17 g daily as needed by mouth.  02/15/17   Auburn BilberryPatel, Shreyang, MD  QUEtiapine (SEROQUEL) 25 MG tablet Take 1 tablet (25 mg total) by mouth at bedtime. Patient not taking: Reported on 04/03/2019 09/26/17   Auburn BilberryPatel, Shreyang, MD  traZODone (DESYREL) 50 MG tablet Take 1 tablet (50 mg total) by mouth at bedtime. 09/26/17    Auburn BilberryPatel, Shreyang, MD    Allergies as of 05/15/2019 - Review Complete 05/14/2019  Allergen Reaction Noted  . Iodine Anaphylaxis and Itching 04/16/2017  . Shellfish allergy Hives and Swelling 02/13/2017    Family History  Problem Relation Age of Onset  . Hypertension Mother   . CAD Mother     Social History   Socioeconomic History  . Marital status: Married    Spouse name: Not on file  . Number of children: Not on file  . Years of education: Not on file  . Highest education level: Not on file  Occupational History  . Occupation: disabled  Social Needs  . Financial resource strain: Not hard at all  . Food insecurity    Worry: Never true    Inability: Never true  . Transportation needs    Medical: No    Non-medical: No  Tobacco Use  . Smoking status: Former Games developermoker  . Smokeless tobacco: Never Used  Substance and Sexual Activity  . Alcohol use: No  . Drug use: No  . Sexual activity: Not Currently  Lifestyle  . Physical activity    Days per week: 0 days    Minutes per session: 0 min  . Stress: Not at all  Relationships  . Social Musicianconnections    Talks on phone: Never    Gets together: Never    Attends religious service: More than 4 times per year    Active member of club or organization: No    Attends meetings of clubs or organizations: Never    Relationship status: Married  . Intimate partner violence    Fear of current or ex partner: Patient refused    Emotionally abused: Patient refused    Physically abused: Patient refused    Forced sexual activity: Patient refused  Other Topics Concern  . Not on file  Social History Narrative   Pt visually impaired; per family crawls around the house    Review of Systems: See HPI, otherwise negative ROS  Physical Exam: BP (!) 149/102   Pulse 81   Temp (!) 97.1 F (36.2 C) (Tympanic)   Resp 20   Ht 6\' 2"  (1.88 m)   Wt 83.9 kg   SpO2 98%   BMI 23.75 kg/m  General:   Alert,  pleasant and cooperative in NAD Head:   Normocephalic and atraumatic. Neck:  Supple; no masses or thyromegaly. Lungs:  Clear throughout to auscultation, normal respiratory effort.    Heart:  +S1, +S2, Regular rate and rhythm, No edema. Abdomen:  Soft, nontender and nondistended. Normal bowel sounds, without guarding, and without rebound.   Neurologic:  Alert and  oriented x4;  grossly normal neurologically.  Impression/Plan: [name] is here for an EGD for esophagitis Risks, benefits, limitations, and alternatives regarding the procedure have been reviewed with the patient.  Questions have been answered.  All parties agreeable.   Pasty SpillersVarnita B Temple Ewart, MD  06/13/2019, 11:26 AM

## 2019-06-13 NOTE — Anesthesia Postprocedure Evaluation (Signed)
Anesthesia Post Note  Patient: [name]  Procedure(s) Performed: ESOPHAGOGASTRODUODENOSCOPY (EGD) WITH PROPOFOL (N/A )  Patient location during evaluation: Endoscopy Anesthesia Type: General Level of consciousness: awake and alert Pain management: pain level controlled Vital Signs Assessment: post-procedure vital signs reviewed and stable Respiratory status: spontaneous breathing, nonlabored ventilation, respiratory function stable and patient connected to nasal cannula oxygen Cardiovascular status: blood pressure returned to baseline and stable Postop Assessment: no apparent nausea or vomiting Anesthetic complications: no     Last Vitals:  Vitals:   06/13/19 1158 06/13/19 1208  BP: (!) 158/104 (!) 149/104  Pulse: 81   Resp: (!) 28   Temp:    SpO2: 100%     Last Pain:  Vitals:   06/13/19 1158  TempSrc:   PainSc: 0-No pain                 Shakeema Lippman S

## 2019-06-16 ENCOUNTER — Encounter: Payer: Self-pay | Admitting: Gastroenterology

## 2019-06-16 LAB — SURGICAL PATHOLOGY

## 2019-06-17 ENCOUNTER — Telehealth: Payer: Self-pay

## 2019-06-17 ENCOUNTER — Encounter: Payer: Self-pay | Admitting: Gastroenterology

## 2019-06-17 NOTE — Telephone Encounter (Signed)
-----   Message from Virgel Manifold, MD sent at 06/17/2019  3:36 PM EDT ----- Caryl Pina please let the patient know, his biopsies were benign and did not show any infection or concerning changes.  He should follow-up with me in clinic in 6 to 8 weeks

## 2019-06-17 NOTE — Telephone Encounter (Signed)
Called and left a message for call back  

## 2019-06-18 NOTE — Telephone Encounter (Signed)
Mailed letter to the patient

## 2019-06-18 NOTE — Telephone Encounter (Signed)
Called and left a message for call back  

## 2019-06-18 NOTE — Telephone Encounter (Signed)
Called and left a message on Mobile number

## 2019-06-19 NOTE — Telephone Encounter (Signed)
Patient legal guardian called back and left me a voicemail. I called her back and left a message for call back

## 2019-06-19 NOTE — Telephone Encounter (Signed)
Patient legal guardian called back and verbalized understanding of lab results. She made appointment for 08/07/19 for follow up appointment

## 2019-08-07 ENCOUNTER — Ambulatory Visit: Payer: Medicare Other | Admitting: Gastroenterology

## 2019-08-07 ENCOUNTER — Encounter: Payer: Self-pay | Admitting: Gastroenterology

## 2019-08-07 DIAGNOSIS — K209 Esophagitis, unspecified without bleeding: Secondary | ICD-10-CM

## 2019-08-20 ENCOUNTER — Other Ambulatory Visit: Payer: Self-pay

## 2019-08-20 ENCOUNTER — Encounter: Payer: Self-pay | Admitting: Nurse Practitioner

## 2019-08-20 ENCOUNTER — Non-Acute Institutional Stay: Payer: Medicare Other | Admitting: Nurse Practitioner

## 2019-08-20 VITALS — BP 128/75 | HR 77 | Temp 97.8°F | Resp 20 | Wt 142.9 lb

## 2019-08-20 DIAGNOSIS — Z515 Encounter for palliative care: Secondary | ICD-10-CM

## 2019-08-20 NOTE — Progress Notes (Signed)
Therapist, nutritional Palliative Care Consult Note Telephone: [phone]  Fax: [phone]  PATIENT NAME: [name] DOB: [birth year] MRN: [id number]  PRIMARY CARE PROVIDER:   Charlott Rakes, MD  REFERRING PROVIDER:  Dr Hodges/[facility] RESPONSIBLE PARTY:   [name] DSS [id number] [phone]  I was asked by Dr [name] to see [name] for palliative care consult for goals of care   RECOMMENDATIONS and PLAN:  1. ACP: full code will need to further discuss with [name] DSS Guardian [phone], [name] about medical goc  2. Palliative care encounter Palliative medicine team will continue to support patient, patient's family, and medical team. Visit consisted of counseling and education dealing with the complex and emotionally intense issues of symptom management and palliative care in the setting of serious and potentially life-threatening illness  I spent 45 minutes providing this consultation,  from 9:15am to 10:00am. More than 50% of the time in this consultation was spent coordinating communication.   HISTORY OF PRESENT ILLNESS:  [name] is a 63 y.o. year old male with multiple medical problems including Vascular Dementia, cerebellar stroke, dysphagia, chronic kidney disease, legally blind, hypertension, hyperlipidemia, seizure disorder, iron deficiency anemia, insomnia. [name] continues to reside in Skilled Long-Term Care Nursing Facility at [facility]. He does require staff to assist him and transferring to a wheelchair where he is able to sit. He is ADL dependent with incontinence. He does require assistance with feeding as [name] is visually impaired. He is on a regular diet. Texture with moderately thick and consistency. He did have a fall on 10/8 / 2020 with no noted injuries. Staff endorses [name] is able to answer simple questions. Last primary provider note 11 / 4 / 2020 for iron deficiency  anemia with stable hemoglobin. He is followed by Psychiatry at the facility with last date of service 67 / 12 / 2020 for vascular dementia, insomnia. Trazodone 25 mg qhs. At present [name] is lying in bed asleep. He awoke to verbal cues. Explained purpose of palliative care visit. Very limited verbal interaction. [name] was cooperative with assessment. [name] did go back to sleep during palliative care visit. Ms. Plant is a full code in medical goals focus on aggressive interventions. Will contact DSS Guardian [name] for further discussion of medical goals plan of care. I updated nursing staff.  Palliative Care was asked to help address goals of care.   CODE STATUS: Full code  PPS: 40% HOSPICE ELIGIBILITY/DIAGNOSIS: TBD  PAST MEDICAL HISTORY:  Past Medical History:  Diagnosis Date   Hypertension    Seizures ([facility])    Stroke ([facility])     SOCIAL HX:  Social History   Tobacco Use   Smoking status: Former Smoker   Smokeless tobacco: Never Used  Substance Use Topics   Alcohol use: No    ALLERGIES:  Allergies  Allergen Reactions   Iodine Anaphylaxis and Itching   Shellfish Allergy Hives and Swelling     PERTINENT MEDICATIONS:  Outpatient Encounter Medications as of 08/20/2019  Medication Sig   acetaminophen (TYLENOL) 650 MG suppository Place 650 mg rectally every 4 (four) hours as needed.   amLODipine (NORVASC) 10 MG tablet Take 1 tablet (10 mg total) by mouth daily.   aspirin EC 81 MG EC tablet Take 1 tablet (81 mg total) by mouth daily.   atorvastatin (LIPITOR) 40 MG tablet Take 1 tablet (40 mg total) by mouth daily at  6 PM. (Patient taking differently: Take 40 mg daily by mouth. )   cloNIDine (CATAPRES) 0.2 MG tablet Take 1 tablet (0.2 mg total) by mouth 2 (two) times daily.   famotidine (PEPCID) 20 MG tablet Take 1 tablet (20 mg total) by mouth at bedtime.   ferrous sulfate 325 (65 FE) MG tablet Take 1 tablet (325 mg total) 3 (three) times daily with  meals by mouth.   hydrALAZINE (APRESOLINE) 100 MG tablet Take 100 mg by mouth 3 (three) times daily.   hydrochlorothiazide (HYDRODIURIL) 25 MG tablet Take 1 tablet (25 mg total) by mouth daily. (Patient not taking: Reported on 04/03/2019)   hydrOXYzine (ATARAX/VISTARIL) 25 MG tablet Take 1 tablet (25 mg total) by mouth every 6 (six) hours as needed for anxiety. (Patient not taking: Reported on 04/03/2019)   loratadine (CLARITIN) 10 MG tablet Take 10 mg by mouth daily.   omeprazole (PRILOSEC) 40 MG capsule Take 1 capsule (40 mg total) by mouth 2 (two) times daily before a meal.   ondansetron (ZOFRAN ODT) 4 MG disintegrating tablet Take 1 tablet (4 mg total) every 8 (eight) hours as needed by mouth for nausea or vomiting. (Patient not taking: Reported on 04/03/2019)   ondansetron (ZOFRAN) 4 MG tablet    phenytoin (DILANTIN) 300 MG ER capsule Take 1 capsule (300 mg total) by mouth at bedtime.   polyethylene glycol (MIRALAX / GLYCOLAX) packet Take 17 g by mouth daily. (Patient taking differently: Take 17 g daily as needed by mouth. )   QUEtiapine (SEROQUEL) 25 MG tablet Take 1 tablet (25 mg total) by mouth at bedtime. (Patient not taking: Reported on 04/03/2019)   traZODone (DESYREL) 50 MG tablet Take 1 tablet (50 mg total) by mouth at bedtime.   No facility-administered encounter medications on file as of 08/20/2019.     PHYSICAL EXAM:   General: NAD, frail appearing, thin male Cardiovascular: regular rate and rhythm Pulmonary: clear ant fields Abdomen: soft, nontender, + bowel sounds Extremities: no edema, no joint deformities Neurological: non-ambulatory [name] Z Jerry Lundquist, NP

## 2019-08-29 ENCOUNTER — Non-Acute Institutional Stay: Payer: Medicare Other | Admitting: Nurse Practitioner

## 2019-08-29 ENCOUNTER — Other Ambulatory Visit: Payer: Self-pay

## 2019-08-29 ENCOUNTER — Encounter: Payer: Self-pay | Admitting: Nurse Practitioner

## 2019-08-29 VITALS — BP 147/88 | HR 66 | Temp 98.3°F | Resp 20 | Wt 142.9 lb

## 2019-08-29 DIAGNOSIS — F0390 Unspecified dementia without behavioral disturbance: Secondary | ICD-10-CM

## 2019-08-29 DIAGNOSIS — Z515 Encounter for palliative care: Secondary | ICD-10-CM

## 2019-08-29 NOTE — Progress Notes (Signed)
Therapist, nutritional Palliative Care Consult Note Telephone: [phone]  Fax: [phone]  PATIENT NAME: [name] DOB: 10-11-1955 MRN: [id number]  PRIMARY CARE PROVIDER:   Charlott Rakes, MD  REFERRING PROVIDER:  Charlott Rakes, MD [address] [place],  [place] [address] PRIMARY CARE PROVIDER:   Charlott Rakes, MD  REFERRING PROVIDER:  Dr Hodges/[facility] RESPONSIBLE PARTY:   [name] DSS [id number] [phone]  I was asked by Dr Yetta Flock to see [name] for palliative care consult for goals of care   RECOMMENDATIONS and PLAN:  1. ACP: full code will need to further discuss with [name] DSS Guardian [phone], [name] about medical goc  2. Palliative care encounter Palliative medicine team will continue to support patient, patient's family, and medical team. Visit consisted of counseling and education dealing with the complex and emotionally intense issues of symptom management and palliative care in the setting of serious and potentially life-threatening illness   I spent 35 minutes providing this consultation,  from 11:00am to 11:35am. More than 50% of the time in this consultation was spent coordinating communication.   HISTORY OF PRESENT ILLNESS:  [name] is a 63 y.o. year old male with multiple medical problems including Vascular Dementia, cerebellar stroke, dysphagia, chronic kidney disease, legally blind, hypertension, hyperlipidemia, seizure disorder, iron deficiency anemia, insomnia. [name] continues to reside in skilled Long-Term Care Nursing Facility. [name] does require assistance with transfers, adl's, toileting, feeding. Appetite does very with meals. Staff endorses new new changes. At present [name] is lying in bed. He appears debilitated but comfortable. No visitors present. I visited and observed [name]. We talked about purpose of palliative care visit and he nodded his  I visited an observed [name]. We talked about purpose of palliative care visit and he nodded his head yes. I asked how he was feeling and he verbalizes said he's not feeling too well today. He is tired. [name] denied any pain or shortness of breath. Asked if he was hungry and he replied no. He was cooperative with assessment. Limited verbal discussion with cognitive impairment. Emotional support provided. I have attempted to contact [name] DSS Guardian for further discussion of medical goals of care is he does remain a full code, message left. I have updated nursing staff know any changes to current goals are plan of care.  Palliative Care was asked to help to continue to address goals of care.   CODE STATUS: Full code  PPS: 40% HOSPICE ELIGIBILITY/DIAGNOSIS: TBD  PAST MEDICAL HISTORY:  Past Medical History:  Diagnosis Date   Hypertension    Seizures (HCC)    Stroke (HCC)     SOCIAL HX:  Social History   Tobacco Use   Smoking status: Former Smoker   Smokeless tobacco: Never Used  Substance Use Topics   Alcohol use: No    ALLERGIES:  Allergies  Allergen Reactions   Iodine Anaphylaxis and Itching   Shellfish Allergy Hives and Swelling     PERTINENT MEDICATIONS:  Outpatient Encounter Medications as of 08/29/2019  Medication Sig   acetaminophen (TYLENOL) 650 MG suppository Place 650 mg rectally every 4 (four) hours as needed.   amLODipine (NORVASC) 10 MG tablet Take 1 tablet (10 mg total) by mouth daily.   aspirin EC 81 MG EC tablet Take 1 tablet (81 mg total) by mouth daily.   atorvastatin (LIPITOR) 40 MG tablet Take 1 tablet (40 mg total)  by mouth daily at 6 PM. (Patient taking differently: Take 40 mg daily by mouth. )   cloNIDine (CATAPRES) 0.2 MG tablet Take 1 tablet (0.2 mg total) by mouth 2 (two) times daily.   famotidine (PEPCID) 20 MG tablet Take 1 tablet (20 mg total) by mouth at bedtime.   ferrous sulfate 325 (65 FE) MG tablet Take 1 tablet  (325 mg total) 3 (three) times daily with meals by mouth.   hydrALAZINE (APRESOLINE) 100 MG tablet Take 100 mg by mouth 3 (three) times daily.   hydrochlorothiazide (HYDRODIURIL) 25 MG tablet Take 1 tablet (25 mg total) by mouth daily. (Patient not taking: Reported on 04/03/2019)   hydrOXYzine (ATARAX/VISTARIL) 25 MG tablet Take 1 tablet (25 mg total) by mouth every 6 (six) hours as needed for anxiety. (Patient not taking: Reported on 04/03/2019)   loratadine (CLARITIN) 10 MG tablet Take 10 mg by mouth daily.   omeprazole (PRILOSEC) 40 MG capsule Take 1 capsule (40 mg total) by mouth 2 (two) times daily before a meal.   ondansetron (ZOFRAN ODT) 4 MG disintegrating tablet Take 1 tablet (4 mg total) every 8 (eight) hours as needed by mouth for nausea or vomiting. (Patient not taking: Reported on 04/03/2019)   ondansetron (ZOFRAN) 4 MG tablet    phenytoin (DILANTIN) 300 MG ER capsule Take 1 capsule (300 mg total) by mouth at bedtime.   polyethylene glycol (MIRALAX / GLYCOLAX) packet Take 17 g by mouth daily. (Patient taking differently: Take 17 g daily as needed by mouth. )   QUEtiapine (SEROQUEL) 25 MG tablet Take 1 tablet (25 mg total) by mouth at bedtime. (Patient not taking: Reported on 04/03/2019)   traZODone (DESYREL) 50 MG tablet Take 1 tablet (50 mg total) by mouth at bedtime.   No facility-administered encounter medications on file as of 08/29/2019.     PHYSICAL EXAM:   General: NAD, frail appearing, thin male, blink Cardiovascular: regular rate and rhythm Pulmonary: clear ant fields Abdomen: soft, nontender, + bowel sounds Extremities: no edema, no joint deformities Neurological: Weakness but otherwise nonfocal  Nancyjo Givhan Ihor Gully, NP

## 2019-09-10 ENCOUNTER — Non-Acute Institutional Stay: Payer: Medicare Other | Admitting: Nurse Practitioner

## 2019-09-10 ENCOUNTER — Encounter: Payer: Self-pay | Admitting: Nurse Practitioner

## 2019-09-10 VITALS — BP 132/62 | HR 88 | Temp 97.3°F | Resp 18 | Wt 140.0 lb

## 2019-09-10 DIAGNOSIS — F0390 Unspecified dementia without behavioral disturbance: Secondary | ICD-10-CM

## 2019-09-10 DIAGNOSIS — Z515 Encounter for palliative care: Secondary | ICD-10-CM

## 2019-09-10 NOTE — Progress Notes (Signed)
[facility] Palliative [facility] Consult Note Telephone: [phone]  Fax: [phone]  PATIENT NAME: [name] DOB: December 17, 1955 MRN: [id number]  PRIMARY CARE PROVIDER:   Maryella Shivers, MD  REFERRING PROVIDER:  Maryella Shivers, MD [place],  [place] [address] PRIMARY CARE PROVIDER:Hodges, [place], MD  [place] PROVIDER:Dr Hodges/[place] [phone] I was asked by Dr Nyra Capes to see [name] for palliative care consult for goals of care  RECOMMENDATIONS and PLAN: 1.ACP: full code will need to further discuss [place] [phone], [name] about medical goc  2.Palliative care encounter Palliative medicine team will continue to support patient, patient's family, and medical team. Visit consisted of counseling and education dealing with the complex and emotionally intense issues of symptom management and palliative care in the setting of serious and potentially life-threatening illness  I spent 35 minutes providing this consultation,  from 9:50am to 10:25am. More than 50% of the time in this consultation was spent coordinating communication.   HISTORY OF PRESENT ILLNESS:  [name] is a 63 y.o. year old male with multiple medical problems including .Vascular Dementia, cerebellar stroke, dysphagia, chronic kidney disease, legally blind, hypertension, hyperlipidemia, seizure disorder, iron deficiency anemia, insomnia. [name] continues to reside at [place] at [facility]. He does remain bed-bound requires assistance for transfers. Mr Mikle Bosworth is able to sit up in a wheelchair the total ADL dependence, toileting. [name] does required to be fed. Appetite continues to decline. Staff endorses Mr. Handshoe is not been getting out of bed as much as prior to covid-19 pandemic. Mr.  Peschke is able to make his needs known at times, Oriented to self. At present [name] is lying in bed. [name] appears debilitated, comfortable. No visitors present. I visited and observed [name]. Attempted to explain purpose for palliative care visit. [name] is blind and he nodded his head but no verbal response. [name] was cooperative with assessment. He does appear to continue continue to decline overall with weight loss. [name] does remain a full code. I have called and left a message for [place] [phone] return call for discussion of goc.  8 / 7 / 2020 weight 154.5 lbs 9 / 3 / 2020 weight 153.8 lb 11 / 19 / 2020 weight 142.9 lb 12 / 4 / 2020 weight 140 lb BMI 21.9  Palliative Care was asked to help to continue to address goals of care.   CODE STATUS: Full code  PPS: 40% HOSPICE ELIGIBILITY/DIAGNOSIS: TBD  PAST MEDICAL HISTORY:  Past Medical History:  Diagnosis Date  . Hypertension   . Seizures ([place])   . Stroke [facility])     SOCIAL HX:  Social History   Tobacco Use  . Smoking status: Former Research scientist (life sciences)  . Smokeless tobacco: Never Used  Substance Use Topics  . Alcohol use: No    ALLERGIES:  Allergies  Allergen Reactions  . Iodine Anaphylaxis and Itching  . Shellfish Allergy Hives and Swelling     PERTINENT MEDICATIONS:  Outpatient Encounter Medications as of 09/10/2019  Medication Sig  . acetaminophen (TYLENOL) 650 MG suppository Place 650 mg rectally every 4 (four) hours as needed.  Marland Kitchen amLODipine (NORVASC) 10 MG tablet Take 1 tablet (10 mg total) by mouth daily.  Marland Kitchen aspirin EC 81 MG EC tablet Take 1 tablet (81 mg total) by mouth daily.  Marland Kitchen  atorvastatin (LIPITOR) 40 MG tablet Take 1 tablet (40 mg total) by mouth daily at 6 PM. (Patient taking differently: Take 40 mg daily by mouth. )  . cloNIDine (CATAPRES) 0.2 MG tablet Take 1 tablet (0.2 mg total) by mouth 2 (two) times daily.  . famotidine (PEPCID) 20 MG tablet Take 1 tablet (20 mg  total) by mouth at bedtime.  . ferrous sulfate 325 (65 FE) MG tablet Take 1 tablet (325 mg total) 3 (three) times daily with meals by mouth.  . hydrALAZINE (APRESOLINE) 100 MG tablet Take 100 mg by mouth 3 (three) times daily.  . hydrochlorothiazide (HYDRODIURIL) 25 MG tablet Take 1 tablet (25 mg total) by mouth daily. (Patient not taking: Reported on 04/03/2019)  . hydrOXYzine (ATARAX/VISTARIL) 25 MG tablet Take 1 tablet (25 mg total) by mouth every 6 (six) hours as needed for anxiety. (Patient not taking: Reported on 04/03/2019)  . loratadine (CLARITIN) 10 MG tablet Take 10 mg by mouth daily.  Marland Kitchen omeprazole (PRILOSEC) 40 MG capsule Take 1 capsule (40 mg total) by mouth 2 (two) times daily before a meal.  . ondansetron (ZOFRAN ODT) 4 MG disintegrating tablet Take 1 tablet (4 mg total) every 8 (eight) hours as needed by mouth for nausea or vomiting. (Patient not taking: Reported on 04/03/2019)  . ondansetron (ZOFRAN) 4 MG tablet   . phenytoin (DILANTIN) 300 MG ER capsule Take 1 capsule (300 mg total) by mouth at bedtime.  . polyethylene glycol (MIRALAX / GLYCOLAX) packet Take 17 g by mouth daily. (Patient taking differently: Take 17 g daily as needed by mouth. )  . QUEtiapine (SEROQUEL) 25 MG tablet Take 1 tablet (25 mg total) by mouth at bedtime. (Patient not taking: Reported on 04/03/2019)  . traZODone (DESYREL) 50 MG tablet Take 1 tablet (50 mg total) by mouth at bedtime.   No facility-administered encounter medications on file as of 09/10/2019.    PHYSICAL EXAM:   General: NAD, frail appearing, thin blind male Cardiovascular: regular rate and rhythm Pulmonary: clear ant fields Abdomen: soft, nontender, + bowel sounds Extremities: no edema, no joint deformities/musclle wasting Neurological: non-ambulatory  Altovise Wahler Z Kamaree Wheatley, NP

## 2019-09-11 ENCOUNTER — Other Ambulatory Visit: Payer: Self-pay

## 2019-09-12 ENCOUNTER — Telehealth: Payer: Self-pay | Admitting: Nurse Practitioner

## 2019-09-12 NOTE — Telephone Encounter (Signed)
[name] returned call DSS Guardian. We talked about purpose or palliative care visit, clinical update discussed. We talked about the weight loss despite Fair appetite.  We talked about dietitians note. We talked about supplements increased. We talked about medical goals of care including most form and code status. [name] currently has a full code. Will mail [name] a blank MOST form in [place] book. Discussed will follow up in 4 weeks for weight check impossible further discussion about hospice if he continues to lose weight.  Total time spent 20 minutes  Phone discussion 15 minutes  Documentation 5 minutes

## 2019-11-05 ENCOUNTER — Non-Acute Institutional Stay: Payer: Medicare Other | Admitting: Nurse Practitioner

## 2019-11-05 ENCOUNTER — Other Ambulatory Visit: Payer: Self-pay

## 2019-11-05 ENCOUNTER — Encounter: Payer: Self-pay | Admitting: Nurse Practitioner

## 2019-11-05 VITALS — BP 148/80 | HR 64 | Temp 97.0°F | Resp 18 | Wt 140.0 lb

## 2019-11-05 DIAGNOSIS — F0390 Unspecified dementia without behavioral disturbance: Secondary | ICD-10-CM

## 2019-11-05 DIAGNOSIS — Z515 Encounter for palliative care: Secondary | ICD-10-CM

## 2019-11-05 NOTE — Progress Notes (Signed)
[facility] Consult Note Telephone: [phone]  Fax: [phone]  PATIENT NAME: [name] DOB: October 05, 1955 MRN: [id number] PRIMARY CARE PROVIDER:Hodges, Beatriz Chancellor, MD  REFERRING PROVIDER:Dr Hodges/[place] [phone] I was asked by Dr Nyra Capes to see [name] for palliative care consult for goals of care  RECOMMENDATIONS and PLAN: 1.ACP: full code will need to further discuss [name] [phone], [name] about medical goc  2.Palliative care encounter Palliative medicine team will continue to support patient, patient's family, and medical team. Visit consisted of counseling and education dealing with the complex and emotionally intense issues of symptom management and palliative care in the setting of serious and potentially life-threatening illness  I spent 35 minutes providing this consultation,  from 10:30am to 11:05am. More than 50% of the time in this consultation was spent coordinating communication.   HISTORY OF PRESENT ILLNESS:  [name] is a 64 y.o. year old male with multiple medical problems including Vascular Dementia, cerebellar stroke, dysphagia, chronic kidney disease, legally blind, hypertension, hyperlipidemia, seizure disorder, iron deficiency anemia, insomnia. [name] continues to reside in [name]. [name] does remain Total Care, repositioning by staff, ADL dependent with incontinence bowel and bladder. [name] does require assistance from staff for feeding and appetite has been poor with a slow decline. Last primary provider note 1 / 20 / 2021 for comprehensive review. He was positive for covid 05/2019 and resolve 10 / 5 / 2020. 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 He is followed by Psychiatry at the facility  with last date of service 1 / 21 / 2021 for dementia vascular, insomnia with no medication at this time prescribed and no changes in orders. No recent falls, hospitalizations. Care plan meeting held 1 / 26 / 2021 to continue goals of care. [name] DSS Guardian. Medical goals focus on aggressive interventions and full code status. [name] is blind how much difficulty verbalizing his needs. At present [name] is lying in bed. He appears thin, debilitated, chronically ill. No visitors present. I visited an observed [name]. Explain purpose of palliative care visit and he nodded his head yes. I asked if he was having any symptoms of pain or shortness of breath and he nodded his head no. Asked if he was hungry and he nodded his head no. [name] was cooperative with assessment. Emotional support provided. Limited discussion due to cognitive impairment. Will contact [place] for further discussion of goals of care and code set is. No new changes at present time. Will also approached option of Hospice Services as noted with weight loss and overall decline. I updated nursing staff. Follow up in 2 weeks if needed or sooner should he declined.  8 / 7 / 2020 weight 154.5 lbs 12 / 4 / 2020 weight 140.0 lbs Palliative Care was asked to help to continue to address goals of care.   CODE STATUS: Full code  PPS: 30% HOSPICE ELIGIBILITY/DIAGNOSIS: TBD  PAST MEDICAL HISTORY:  Past Medical History:  Diagnosis Date  . Hypertension   . Seizures ([place])   . Stroke [facility])     SOCIAL HX:  Social History   Tobacco Use  . Smoking status: Former Research scientist (life sciences)  . Smokeless tobacco: Never Used  Substance Use Topics  . Alcohol use: No    ALLERGIES:  Allergies  Allergen Reactions  . Iodine Anaphylaxis and Itching  . Shellfish Allergy Hives and Swelling     PERTINENT MEDICATIONS:  Outpatient Encounter Medications as of 11/05/2019  Medication Sig  . acetaminophen (TYLENOL) 650 MG suppository  Place 650 mg rectally every 4 (four) hours as needed.  Marland Kitchen amLODipine (NORVASC) 10 MG tablet Take 1 tablet (10 mg total) by mouth daily.  Marland Kitchen aspirin EC 81 MG EC tablet Take 1 tablet (81 mg total) by mouth daily.  Marland Kitchen atorvastatin (LIPITOR) 40 MG tablet Take 1 tablet (40 mg total) by mouth daily at 6 PM. (Patient taking differently: Take 40 mg daily by mouth. )  . cloNIDine (CATAPRES) 0.2 MG tablet Take 1 tablet (0.2 mg total) by mouth 2 (two) times daily.  . famotidine (PEPCID) 20 MG tablet Take 1 tablet (20 mg total) by mouth at bedtime.  . ferrous sulfate 325 (65 FE) MG tablet Take 1 tablet (325 mg total) 3 (three) times daily with meals by mouth.  . hydrALAZINE (APRESOLINE) 100 MG tablet Take 100 mg by mouth 3 (three) times daily.  . hydrochlorothiazide (HYDRODIURIL) 25 MG tablet Take 1 tablet (25 mg total) by mouth daily. (Patient not taking: Reported on 04/03/2019)  . hydrOXYzine (ATARAX/VISTARIL) 25 MG tablet Take 1 tablet (25 mg total) by mouth every 6 (six) hours as needed for anxiety. (Patient not taking: Reported on 04/03/2019)  . loratadine (CLARITIN) 10 MG tablet Take 10 mg by mouth daily.  Marland Kitchen omeprazole (PRILOSEC) 40 MG capsule Take 1 capsule (40 mg total) by mouth 2 (two) times daily before a meal.  . ondansetron (ZOFRAN ODT) 4 MG disintegrating tablet Take 1 tablet (4 mg total) every 8 (eight) hours as needed by mouth for nausea or vomiting. (Patient not taking: Reported on 04/03/2019)  . ondansetron (ZOFRAN) 4 MG tablet   . phenytoin (DILANTIN) 300 MG ER capsule Take 1 capsule (300 mg total) by mouth at bedtime.  . polyethylene glycol (MIRALAX / GLYCOLAX) packet Take 17 g by mouth daily. (Patient taking differently: Take 17 g daily as needed by mouth. )  . QUEtiapine (SEROQUEL) 25 MG tablet Take 1 tablet (25 mg total) by mouth at bedtime. (Patient not taking: Reported on 04/03/2019)  . traZODone (DESYREL) 50 MG tablet Take 1 tablet (50 mg total) by mouth at bedtime.   No facility-administered  encounter medications on file as of 11/05/2019.    PHYSICAL EXAM:   General: NAD, frail appearing, thin debilitated, confused male, blind Cardiovascular: regular rate and rhythm Pulmonary: clear ant fields Abdomen: soft, nontender, + bowel sounds Extremities: no edema, no joint deformities/+muscle wasting Neurological: functional quadriplgic  Hasina Kreager Z Dominie Benedick, NP

## 2019-11-19 ENCOUNTER — Telehealth: Payer: Self-pay | Admitting: Nurse Practitioner

## 2019-11-19 NOTE — Telephone Encounter (Signed)
I called Valarie Cones, DSS Guardian for follow up on palliative care visit. Jon Gills and I talked about purpose for palliative care visit, palliative care visit with [name]. We talked about weight loss [place] ability, decline. We talked about medical goals of care is [name] is a full code. We talked about Full code versus DNR in addition to most form. [name] DSS endorses that she will look into completing most form and review with her supervisor about code status. Discuss the role of palliative care and plan of care. Will fax notes and follow up in 2 weeks or sooner should he declined.  Total time 20 minutes  Documentation 5 minutes  Phone discussion 15 minutes

## 2019-11-26 ENCOUNTER — Encounter: Payer: Self-pay | Admitting: Nurse Practitioner

## 2019-11-26 ENCOUNTER — Non-Acute Institutional Stay: Payer: Medicare Other | Admitting: Nurse Practitioner

## 2019-11-26 ENCOUNTER — Other Ambulatory Visit: Payer: Self-pay

## 2019-11-26 VITALS — BP 120/60 | HR 76 | Temp 98.2°F | Resp 18 | Wt 140.2 lb

## 2019-11-26 DIAGNOSIS — Z515 Encounter for palliative care: Secondary | ICD-10-CM

## 2019-11-26 DIAGNOSIS — F0390 Unspecified dementia without behavioral disturbance: Secondary | ICD-10-CM

## 2019-11-26 NOTE — Progress Notes (Signed)
[facility] Consult Note Telephone: [phone]  Fax: [phone]  PATIENT NAME: [name] DOB: [birth year] MRN: [id number]  PRIMARY CARE PROVIDER:Hodges, Beatriz Chancellor, MD  REFERRING PROVIDER:Dr Hodges/[place] Guardian [phone]  RECOMMENDATIONS and PLAN: 1.ACP: full code will need to further discuss [name] [phone], [name] about medical goc  2.Palliative care encounter Palliative medicine team will continue to support patient, patient's family, and medical team. Visit consisted of counseling and education dealing with the complex and emotionally intense issues of symptom management and palliative care in the setting of serious and potentially life-threatening illness  I spent 40 minutes providing this consultation,  from 12:45pm to 1:20pm. More than 50% of the time in this consultation was spent coordinating communication.   HISTORY OF PRESENT ILLNESS:  [name] is a 64 y.o. year old male with multiple medical problems including Vascular Dementia, cerebellar stroke, dysphagia, chronic kidney disease, legally blind, hypertension, hyperlipidemia, seizure disorder, iron deficiency anemia, insomnia. [name] continues to reside in [place] at [facility]. [name] does require total ADL dependence for transferring, mobility, adl's, toileting, feeding. [name] appetite has improved her staff. [name] weight has been relatively stable over the last two months.Fall on 2 / 15 / 2021 and 11/13/2019 observe lying on the floor mat, no noted injury. OT evaluation for positioning in bed for a possible go mattress. Last palliative discussion with [place] for further discussion of medical goals of care with family including code status. I visited and observed [name]. [name]  did answer questions to pain and shortness of breath replying no. We talked about what he had to eat for lunch though he does not remember. [name] was cooperative with assessment. Emotional support provided. Palliative care visit supportive. Will follow up with [name] DSS guardian for further discussion of medical goals of care. I updated nursing staff in the new changes at present time to goals  12 / 4 / 2,020 weight 140.0 lbs 2 / 16 / 2021 weight 142.4 lbs 3 / 2 / 2021 weight 140.2 lbs BMI 22.0  Palliative Care was asked to help to continue to address goals of care.   CODE STATUS: full code  PPS: 30% HOSPICE ELIGIBILITY/DIAGNOSIS: TBD  PAST MEDICAL HISTORY:  Past Medical History:  Diagnosis Date  . Hypertension   . Seizures ([place])   . Stroke [facility])     SOCIAL HX:  Social History   Tobacco Use  . Smoking status: Former Research scientist (life sciences)  . Smokeless tobacco: Never Used  Substance Use Topics  . Alcohol use: No    ALLERGIES:  Allergies  Allergen Reactions  . Iodine Anaphylaxis and Itching  . Shellfish Allergy Hives and Swelling     PERTINENT MEDICATIONS:  Outpatient Encounter Medications as of 11/26/2019  Medication Sig  . acetaminophen (TYLENOL) 650 MG suppository Place 650 mg rectally every 4 (four) hours as needed.  Marland Kitchen amLODipine (NORVASC) 10 MG tablet Take 1 tablet (10 mg total) by mouth daily.  Marland Kitchen aspirin EC 81 MG EC tablet Take 1 tablet (81 mg total) by mouth daily.  Marland Kitchen atorvastatin (LIPITOR) 40 MG tablet Take 1 tablet (40 mg total) by mouth daily at 6 PM. (Patient taking differently: Take 40 mg daily by mouth. )  . cloNIDine (CATAPRES) 0.2 MG tablet Take 1 tablet (0.2 mg total) by mouth 2 (two) times daily.  Marland Kitchen  famotidine (PEPCID) 20 MG tablet Take 1 tablet (20 mg total) by mouth at bedtime.  . ferrous sulfate 325 (65 FE) MG tablet Take 1 tablet (325 mg total) 3 (three) times daily with meals by mouth.  . hydrALAZINE (APRESOLINE) 100 MG tablet Take 100 mg by mouth 3  (three) times daily.  . hydrochlorothiazide (HYDRODIURIL) 25 MG tablet Take 1 tablet (25 mg total) by mouth daily. (Patient not taking: Reported on 11/05/2019)  . hydrOXYzine (ATARAX/VISTARIL) 25 MG tablet Take 1 tablet (25 mg total) by mouth every 6 (six) hours as needed for anxiety. (Patient not taking: Reported on 04/03/2019)  . loratadine (CLARITIN) 10 MG tablet Take 10 mg by mouth daily.  Marland Kitchen omeprazole (PRILOSEC) 40 MG capsule Take 1 capsule (40 mg total) by mouth 2 (two) times daily before a meal.  . ondansetron (ZOFRAN ODT) 4 MG disintegrating tablet Take 1 tablet (4 mg total) every 8 (eight) hours as needed by mouth for nausea or vomiting. (Patient not taking: Reported on 04/03/2019)  . ondansetron (ZOFRAN) 4 MG tablet   . phenytoin (DILANTIN) 300 MG ER capsule Take 1 capsule (300 mg total) by mouth at bedtime.  . polyethylene glycol (MIRALAX / GLYCOLAX) packet Take 17 g by mouth daily. (Patient taking differently: Take 17 g daily as needed by mouth. )  . QUEtiapine (SEROQUEL) 25 MG tablet Take 1 tablet (25 mg total) by mouth at bedtime. (Patient not taking: Reported on 04/03/2019)  . traZODone (DESYREL) 50 MG tablet Take 1 tablet (50 mg total) by mouth at bedtime. (Patient taking differently: Take 25 mg by mouth at bedtime. )   No facility-administered encounter medications on file as of 11/26/2019.    PHYSICAL EXAM:   General: debilitated, chronically ill, frail appearing, thin male Cardiovascular: regular rate and rhythm Pulmonary: clear ant fields Abdomen: soft, nontender, + bowel sounds Extremities: no edema, no joint deformities/muscle wasting Neurological: functional quadriplegic  Khylei Wilms [place], NP

## 2019-11-27 ENCOUNTER — Telehealth: Payer: Self-pay | Admitting: Nurse Practitioner

## 2019-11-27 NOTE — Telephone Encounter (Signed)
I called [name] DSS Guardian, discussed PC visit, MOST form, DNR. Ms, [name] DSS will contact family to further discuss goc and to set time to meet telemedicine with [facility] for further discussion.

## 2019-12-10 ENCOUNTER — Encounter: Payer: Self-pay | Admitting: Nurse Practitioner

## 2019-12-10 ENCOUNTER — Non-Acute Institutional Stay: Payer: Medicare Other | Admitting: Nurse Practitioner

## 2019-12-10 ENCOUNTER — Other Ambulatory Visit: Payer: Self-pay

## 2019-12-10 VITALS — BP 156/80 | HR 69 | Temp 97.3°F | Resp 18 | Wt 142.0 lb

## 2019-12-10 DIAGNOSIS — F0390 Unspecified dementia without behavioral disturbance: Secondary | ICD-10-CM

## 2019-12-10 DIAGNOSIS — Z515 Encounter for palliative care: Secondary | ICD-10-CM

## 2019-12-10 NOTE — Progress Notes (Signed)
Therapist, nutritional Palliative Care Consult Note Telephone: [phone]  Fax: [phone]  PATIENT NAME: [name] DOB: October 25, 1955 MRN: [id number] PRIMARY CARE PROVIDER:Hodges, Para March, MD  REFERRING PROVIDER:Dr Hodges/[facility] RESPONSIBLE PARTY:[facility] DSS Guardian [phone]  RECOMMENDATIONS and PLAN: 1.ACP: full code waiting to hear from DSS to complete MOST form  2.Palliative care encounter Palliative medicine team will continue to support patient, patient's family, and medical team. Visit consisted of counseling and education dealing with the complex and emotionally intense issues of symptom management and palliative care in the setting of serious and potentially life-threatening illness  I spent 35 minutes providing this consultation,  from 10:45am to 11:20am. More than 50% of the time in this consultation was spent coordinating communication.   HISTORY OF PRESENT ILLNESS:  [name] is a 64 y.o. year old male with multiple medical problems including Vascular Dementia, cerebellar stroke, dysphagia, chronic kidney disease, legally blind, hypertension, hyperlipidemia, seizure disorder, iron deficiency anemia, insomnia.Mr. Moder continues to reside in [facility] at [facility]. Mr. Barnette is total ADL dependence including dressing, bathing, toileting, feeding. Mr Yin does require to be fed as he is blind. He has had a 2.2 lb weight loss over the last 4 weeks with BMI 22.2 and weight currently 140.2 lb. Mr. Gatliff did have a fall on 2 / 15 / 2021 with no noted injury. No other changes for staff. Palliative care to follow up today with pending completion of most form. At present Mr. Captain is lying in bed, appears chronically ill then but comfortable. No visitors present. I visited and observed Mr. Castrillon. I talked about purpose of palliative care visit though cognitive deficit  limited. We talked about how he is feeling today. We talked about symptoms of pain when she denies. We talked about his appetite. Mr. Virnig was cooperative with assessment. No other changes. Emotional support provided. Most of palliative visits supportive. I called Lasha Hickman DSS Guardian, message left with contact information to update on palliative care visit and check on most form. Palliative Care was asked to help to continue to address goals of care.   CODE STATUS: full code  PPS: 30% HOSPICE ELIGIBILITY/DIAGNOSIS: TBD  PAST MEDICAL HISTORY:  Past Medical History:  Diagnosis Date  . Hypertension   . Seizures (HCC)   . Stroke [facility])     SOCIAL HX:  Social History   Tobacco Use  . Smoking status: Former Games developer  . Smokeless tobacco: Never Used  Substance Use Topics  . Alcohol use: No    ALLERGIES:  Allergies  Allergen Reactions  . Iodine Anaphylaxis and Itching  . Shellfish Allergy Hives and Swelling     PERTINENT MEDICATIONS:  Outpatient Encounter Medications as of 12/10/2019  Medication Sig  . acetaminophen (TYLENOL) 650 MG suppository Place 650 mg rectally every 4 (four) hours as needed.  Marland Kitchen amLODipine (NORVASC) 10 MG tablet Take 1 tablet (10 mg total) by mouth daily.  Marland Kitchen aspirin EC 81 MG EC tablet Take 1 tablet (81 mg total) by mouth daily.  Marland Kitchen atorvastatin (LIPITOR) 40 MG tablet Take 1 tablet (40 mg total) by mouth daily at 6 PM. (Patient taking differently: Take 40 mg daily by mouth. )  . cloNIDine (CATAPRES) 0.2 MG tablet Take 1 tablet (0.2 mg total) by mouth 2 (two) times daily.  . famotidine (PEPCID) 20 MG tablet Take 1 tablet (20 mg total) by mouth at bedtime.  . ferrous sulfate  325 (65 FE) MG tablet Take 1 tablet (325 mg total) 3 (three) times daily with meals by mouth.  . hydrALAZINE (APRESOLINE) 100 MG tablet Take 100 mg by mouth 3 (three) times daily.  . hydrochlorothiazide (HYDRODIURIL) 25 MG tablet Take 1 tablet (25 mg total) by mouth daily. (Patient not  taking: Reported on 11/05/2019)  . hydrOXYzine (ATARAX/VISTARIL) 25 MG tablet Take 1 tablet (25 mg total) by mouth every 6 (six) hours as needed for anxiety. (Patient not taking: Reported on 04/03/2019)  . loratadine (CLARITIN) 10 MG tablet Take 10 mg by mouth daily.  Marland Kitchen omeprazole (PRILOSEC) 40 MG capsule Take 1 capsule (40 mg total) by mouth 2 (two) times daily before a meal.  . ondansetron (ZOFRAN ODT) 4 MG disintegrating tablet Take 1 tablet (4 mg total) every 8 (eight) hours as needed by mouth for nausea or vomiting. (Patient not taking: Reported on 04/03/2019)  . ondansetron (ZOFRAN) 4 MG tablet   . phenytoin (DILANTIN) 300 MG ER capsule Take 1 capsule (300 mg total) by mouth at bedtime.  . polyethylene glycol (MIRALAX / GLYCOLAX) packet Take 17 g by mouth daily. (Patient taking differently: Take 17 g daily as needed by mouth. )  . QUEtiapine (SEROQUEL) 25 MG tablet Take 1 tablet (25 mg total) by mouth at bedtime. (Patient not taking: Reported on 04/03/2019)  . traZODone (DESYREL) 50 MG tablet Take 1 tablet (50 mg total) by mouth at bedtime. (Patient taking differently: Take 25 mg by mouth at bedtime. )   No facility-administered encounter medications on file as of 12/10/2019.    PHYSICAL EXAM:   General: NAD, frail appearing, thin, fragile, male Cardiovascular: regular rate and rhythm Pulmonary: clear ant fields Abdomen: soft, nontender, + bowel sounds Extremities: no edema, no joint deformities/muscle wasting Neurological: functional quadriplegic  Morene Cecilio Ihor Gully, NP

## 2020-01-09 ENCOUNTER — Encounter: Payer: Self-pay | Admitting: Nurse Practitioner

## 2020-01-09 ENCOUNTER — Non-Acute Institutional Stay: Payer: Medicare Other | Admitting: Nurse Practitioner

## 2020-01-09 ENCOUNTER — Other Ambulatory Visit: Payer: Self-pay

## 2020-01-09 VITALS — BP 136/78 | HR 78 | Temp 98.9°F | Resp 18 | Wt 128.0 lb

## 2020-01-09 DIAGNOSIS — F0390 Unspecified dementia without behavioral disturbance: Secondary | ICD-10-CM

## 2020-01-09 DIAGNOSIS — Z515 Encounter for palliative care: Secondary | ICD-10-CM

## 2020-01-09 NOTE — Progress Notes (Signed)
Therapist, nutritional Palliative Care Consult Note Telephone: [phone]  Fax: [phone]  PATIENT NAME: [name] DOB: Nov 29, 1955 MRN: [id number]  PRIMARY CARE PROVIDER:Hodges, Para March, MD  REFERRING PROVIDER:Dr Hodges/[facility] RESPONSIBLE PARTY:Alexis [facility] DSS Guardian [phone]  RECOMMENDATIONS and PLAN: 1.ACP: full code waiting to hear from DSS to complete MOST form  2.Palliative care encounter Palliative medicine team will continue to support patient, patient's family, and medical team. Visit consisted of counseling and education dealing with the complex and emotionally intense issues of symptom management and palliative care in the setting of serious and potentially life-threatening illness  I spent 55 minutes providing this consultation,  from 11:20am to 12:15pm. More than 50% of the time in this consultation was spent coordinating communication.   HISTORY OF PRESENT ILLNESS:  [name] is a 64 y.o. year old male with multiple medical problems including Vascular Dementia, cerebellar stroke, dysphagia, chronic kidney disease, legally blind, hypertension, hyperlipidemia, seizure disorder, iron deficiency anemia, insomnia. [name] continues to reside at [facility] at [facility]. Jerry. Patton does remain maximum dependent for mobility, transfers. Jerry. Patton is total ADL dependence. Staff does have to feed him an appetite varies fair to poor. Staff endorses he ate 100% of his breakfast today. He has had a 1 pound weight loss in the last 4 weeks. 3 / 19 / 2021 fall with no noted injury. No recent hospitalizations, infections, wounds. At present Jerry. Patton is lying in bed. Jerry. Patton appears then, debilitated, comfortable. No visitors present. Jerry. Patton is under DSS guardianship. I visited and observe Jerry. Patton. Explain purpose of palliative care visit. [name] is blind. Asked if  he was having symptoms of pain or shortness of breath for which he replied no. Limited verbal discussion with cognitive impairment. Jerry. Patton was cooperative with assessment. Emotional support provided. I called Mariel Craft, DSS Guardian to update on palliative care visit. We talked about pending most form completion with DNR in medical goals to focus on Comfort. Mariel Craft endorses she will follow up to see where the most warm is in the process for approval. No new changes to current goals or plan of care. I have updated nursing staff. Will follow up in 4 weeks if needed or sooner should he declined monitoring is weights.  2 / 16 / 2021 weight 142.4 lbs 3 / 2 / 2021 weight 140.2 lbs 4/5 / 2021 weight 138.0 lbs BMI 21.6  Palliative Care was asked to help to continue to address goals of care.   CODE STATUS: full code  PPS: 30% HOSPICE ELIGIBILITY/DIAGNOSIS: TBD  PAST MEDICAL HISTORY:  Past Medical History:  Diagnosis Date  . Hypertension   . Seizures (HCC)   . Stroke [facility])     SOCIAL HX:  Social History   Tobacco Use  . Smoking status: Former Games developer  . Smokeless tobacco: Never Used  Substance Use Topics  . Alcohol use: No    ALLERGIES:  Allergies  Allergen Reactions  . Iodine Anaphylaxis and Itching  . Shellfish Allergy Hives and Swelling     PERTINENT MEDICATIONS:  Outpatient Encounter Medications as of 01/09/2020  Medication Sig  . acetaminophen (TYLENOL) 650 MG suppository Place 650 mg rectally every 4 (four) hours as needed.  Marland Kitchen amLODipine (NORVASC) 10 MG tablet Take 1 tablet (10 mg total) by mouth daily.  Marland Kitchen aspirin EC 81 MG EC tablet Take 1 tablet (81 mg total) by mouth daily.  Marland Kitchen  atorvastatin (LIPITOR) 40 MG tablet Take 1 tablet (40 mg total) by mouth daily at 6 PM. (Patient taking differently: Take 40 mg daily by mouth. )  . cloNIDine (CATAPRES) 0.2 MG tablet Take 1 tablet (0.2 mg total) by mouth 2 (two) times daily.  . famotidine (PEPCID) 20 MG tablet Take 1 tablet  (20 mg total) by mouth at bedtime.  . ferrous sulfate 325 (65 FE) MG tablet Take 1 tablet (325 mg total) 3 (three) times daily with meals by mouth.  . hydrALAZINE (APRESOLINE) 100 MG tablet Take 100 mg by mouth 3 (three) times daily.  . hydrochlorothiazide (HYDRODIURIL) 25 MG tablet Take 1 tablet (25 mg total) by mouth daily. (Patient not taking: Reported on 11/05/2019)  . hydrOXYzine (ATARAX/VISTARIL) 25 MG tablet Take 1 tablet (25 mg total) by mouth every 6 (six) hours as needed for anxiety. (Patient not taking: Reported on 04/03/2019)  . loratadine (CLARITIN) 10 MG tablet Take 10 mg by mouth daily.  Marland Kitchen omeprazole (PRILOSEC) 40 MG capsule Take 1 capsule (40 mg total) by mouth 2 (two) times daily before a meal.  . ondansetron (ZOFRAN ODT) 4 MG disintegrating tablet Take 1 tablet (4 mg total) every 8 (eight) hours as needed by mouth for nausea or vomiting. (Patient not taking: Reported on 04/03/2019)  . ondansetron (ZOFRAN) 4 MG tablet   . phenytoin (DILANTIN) 300 MG ER capsule Take 1 capsule (300 mg total) by mouth at bedtime.  . polyethylene glycol (MIRALAX / GLYCOLAX) packet Take 17 g by mouth daily. (Patient taking differently: Take 17 g daily as needed by mouth. )  . QUEtiapine (SEROQUEL) 25 MG tablet Take 1 tablet (25 mg total) by mouth at bedtime. (Patient not taking: Reported on 04/03/2019)  . traZODone (DESYREL) 50 MG tablet Take 1 tablet (50 mg total) by mouth at bedtime. (Patient taking differently: Take 25 mg by mouth at bedtime. )   No facility-administered encounter medications on file as of 01/09/2020.    PHYSICAL EXAM:   General: NAD, frail appearing, thin, debilitated, chronically ill male Cardiovascular: regular rate and rhythm Pulmonary: clear ant fields Extremities: no edema, no joint deformities/muscle wasting Neurological: functional quadriplegic Toan Mort Ihor Gully, NP

## 2020-02-11 ENCOUNTER — Other Ambulatory Visit: Payer: Self-pay

## 2020-02-11 ENCOUNTER — Non-Acute Institutional Stay: Payer: Medicare Other | Admitting: Nurse Practitioner

## 2020-02-11 ENCOUNTER — Encounter: Payer: Self-pay | Admitting: Nurse Practitioner

## 2020-02-11 DIAGNOSIS — F0390 Unspecified dementia without behavioral disturbance: Secondary | ICD-10-CM

## 2020-02-11 DIAGNOSIS — Z515 Encounter for palliative care: Secondary | ICD-10-CM

## 2020-02-11 NOTE — Progress Notes (Signed)
[place] Consult Note Telephone: [phone]  Fax: [phone]  PATIENT NAME: [name] DOB: 07/05/1956 MRN: [id number] PRIMARY CARE PROVIDER:Hodges, Beatriz Chancellor, MD  REFERRING PROVIDER:Dr [place] [phone]  RECOMMENDATIONS and PLAN: 1.ACP: DNR/MOST form placed in Vynca  2.Palliative care encounter Palliative medicine team will continue to support patient, patient's family, and medical team. Visit consisted of counseling and education dealing with the complex and emotionally intense issues of symptom management and palliative care in the setting of serious and potentially life-threatening illness  I spent 90 minutes providing this consultation, at 1:00pm. More than 50% of the time in this consultation was spent coordinating communication.   HISTORY OF PRESENT ILLNESS:  [name] is a 64 y.o. year old male with multiple medical problems including Vascular Dementia, cerebellar stroke, dysphagia, chronic kidney disease, legally blind, hypertension, hyperlipidemia, seizure disorder, iron deficiency anemia, insomnia. [name] continues to reside at [place] at [facility]. [name] remains total ADL dependence including transferring, mobility, adl's. [name] does require staff to turn in position him. [name] is incontinent of bowel and bladder. [name] does require to be fed with appetite for. Over the last several weeks he has been diagnosed with a DVT lower extremity common ammonia for which he is being currently treated with antibiotics. [name] continues to overall decline, debility. Most form was returned from [place] to say DNR, do not hospitalized, do not intubate, placed in Vynca. At present Jerry. Patton is lying in bed, appears ill, debilitated severely, weak, lethargic. Jerry. Patton did mumbles to  verbal cues. Jerry. Patton was cooperative with the assessment. Jerry. Patton has continued to decline appearing approaching end of life. Discussed with [place] Milagros Loll NP will review case with hospice Physicians for eligibility. Hospice Physicians in agreement that Jerry Joyce is Hospice eligible. I called and left a message for [place] to return call to further discuss decline an option of Hospice Services if wishes. Medical goals reviewed. Emotional support provided. Palliative Care was asked to help to continue to address goals of care.   CODE STATUS: DNR  PPS: 30% HOSPICE ELIGIBILITY/DIAGNOSIS: TBD  PAST MEDICAL HISTORY:  Past Medical History:  Diagnosis Date  . Hypertension   . Seizures ([place])   . Stroke [facility])     SOCIAL HX:  Social History   Tobacco Use  . Smoking status: Former Research scientist (life sciences)  . Smokeless tobacco: Never Used  Substance Use Topics  . Alcohol use: No    ALLERGIES:  Allergies  Allergen Reactions  . Iodine Anaphylaxis and Itching  . Shellfish Allergy Hives and Swelling     PERTINENT MEDICATIONS:  Outpatient Encounter Medications as of 02/11/2020  Medication Sig  . acetaminophen (TYLENOL) 650 MG suppository Place 650 mg rectally every 4 (four) hours as needed.  Marland Kitchen amLODipine (NORVASC) 10 MG tablet Take 1 tablet (10 mg total) by mouth daily.  Marland Kitchen aspirin EC 81 MG EC tablet Take 1 tablet (81 mg total) by mouth daily.  Marland Kitchen atorvastatin (LIPITOR) 40 MG tablet Take 1 tablet (40 mg total) by mouth daily at 6 PM. (Patient taking differently: Take 40 mg daily by mouth. )  . cloNIDine (CATAPRES) 0.2 MG tablet Take 1 tablet (0.2 mg total) by mouth 2 (two) times daily.  . famotidine (PEPCID) 20 MG tablet Take 1 tablet (20 mg total) by mouth at bedtime.  Marland Kitchen  ferrous sulfate 325 (65 FE) MG tablet Take 1 tablet (325 mg total) 3 (three) times daily with meals by mouth.  . hydrALAZINE (APRESOLINE) 100 MG tablet Take 100 mg by mouth 3 (three) times daily.  .  hydrochlorothiazide (HYDRODIURIL) 25 MG tablet Take 1 tablet (25 mg total) by mouth daily. (Patient not taking: Reported on 11/05/2019)  . hydrOXYzine (ATARAX/VISTARIL) 25 MG tablet Take 1 tablet (25 mg total) by mouth every 6 (six) hours as needed for anxiety. (Patient not taking: Reported on 04/03/2019)  . loratadine (CLARITIN) 10 MG tablet Take 10 mg by mouth daily.  Marland Kitchen omeprazole (PRILOSEC) 40 MG capsule Take 1 capsule (40 mg total) by mouth 2 (two) times daily before a meal.  . ondansetron (ZOFRAN ODT) 4 MG disintegrating tablet Take 1 tablet (4 mg total) every 8 (eight) hours as needed by mouth for nausea or vomiting. (Patient not taking: Reported on 04/03/2019)  . ondansetron (ZOFRAN) 4 MG tablet   . phenytoin (DILANTIN) 300 MG ER capsule Take 1 capsule (300 mg total) by mouth at bedtime.  . polyethylene glycol (MIRALAX / GLYCOLAX) packet Take 17 g by mouth daily. (Patient taking differently: Take 17 g daily as needed by mouth. )  . QUEtiapine (SEROQUEL) 25 MG tablet Take 1 tablet (25 mg total) by mouth at bedtime. (Patient not taking: Reported on 04/03/2019)  . traZODone (DESYREL) 50 MG tablet Take 1 tablet (50 mg total) by mouth at bedtime. (Patient taking differently: Take 25 mg by mouth at bedtime. )   No facility-administered encounter medications on file as of 02/11/2020.    PHYSICAL EXAM:   General: thin, severely debilitated, cognitively impaired male, chronically ill Cardiovascular: regular rate and rhythm Pulmonary: clear ant fields Extremities: no edema, no joint deformities/muscle wasting Neurological: functionally quadriplegic  Harpreet Pompey Prince Rome, NP

## 2020-02-13 ENCOUNTER — Encounter: Payer: Self-pay | Admitting: Nurse Practitioner

## 2020-02-13 ENCOUNTER — Other Ambulatory Visit: Payer: Self-pay

## 2020-02-13 ENCOUNTER — Non-Acute Institutional Stay: Payer: Medicare Other | Admitting: Nurse Practitioner

## 2020-02-13 VITALS — BP 119/70 | HR 88 | Temp 97.9°F | Resp 18 | Wt 138.0 lb

## 2020-02-13 DIAGNOSIS — F0390 Unspecified dementia without behavioral disturbance: Secondary | ICD-10-CM

## 2020-02-13 DIAGNOSIS — Z515 Encounter for palliative care: Secondary | ICD-10-CM

## 2020-02-13 NOTE — Progress Notes (Signed)
[place] Consult Note Telephone: [phone]  Fax: [phone]  PATIENT NAME: [name] DOB: 1956-08-04 MRN: [id number]  PRIMARY CARE PROVIDER:Hodges, Beatriz Chancellor, MD  REFERRING PROVIDER:Dr [place] [id number]  RECOMMENDATIONS and PLAN: 1.ACP: DNR/MOST form placed in Vynca  2.Palliative care encounter Palliative medicine team will continue to support patient, patient's family, and medical team. Visit consisted of counseling and education dealing with the complex and emotionally intense issues of symptom management and palliative care in the setting of serious and potentially life-threatening illness  I spent 75 minutes providing this consultation, starting at 12:30pm More than 50% of the time in this consultation was spent coordinating communication.   HISTORY OF PRESENT ILLNESS:  [name] is a 65 y.o. year old male with multiple medical problems including Vascular Dementia, cerebellar stroke, dysphagia, chronic kidney disease, legally blind, hypertension, hyperlipidemia, seizure disorder, iron deficiency anemia, insomnia.[name] continues to reside at [place] at [facility]. [name] is total ADL care, incontinent bowel and bladder, requires to be fed. Appetite remains to decline with weight loss greater than 10%. He currently is on Levaquin for pneumonia. Attempting to contact DSS Guardian for further discussion of medical goals of care. Most forms with focus on comfort, DNR, DNI, DNH. At present [name] is lying in bed. He appears severely debilitated functionally and cognitively, no distress, no visitors present. I visited and observe [name]. [name] did turn his head towards my voice. When asked if he was having pain he replied no. No further verbal responses two questions. [name] was cooperative with the  assessment. [name] does appear to continue to overall decline clinically. Emotional support provided. I attempted to call [employer] who no longer is employed. I did talk to Levander Campion and Nance Pew Supervisor DSS guardians; about overall decline, reviewed medical goals of care and discuss the option of hospice. Levander Campion and Nance Pew in agreement to proceed with hospice. I have updated [employer] NP. Hospice order written will stop skilled days but continue Levaquin until completed when facility ready. Nance Pew endorses she will contact family for update. Questions answered the satisfaction. Palliative Care was asked to help to continue to address goals of care.   CODE STATUS: DNR  PPS: 30% HOSPICE ELIGIBILITY/DIAGNOSIS: Yes per Hospice Physicians  PAST MEDICAL HISTORY:  Past Medical History:  Diagnosis Date  . Hypertension   . Seizures ([place])   . Stroke [facility])     SOCIAL HX:  Social History   Tobacco Use  . Smoking status: Former Research scientist (life sciences)  . Smokeless tobacco: Never Used  Substance Use Topics  . Alcohol use: No    ALLERGIES:  Allergies  Allergen Reactions  . Iodine Anaphylaxis and Itching  . Shellfish Allergy Hives and Swelling     PERTINENT MEDICATIONS:  Outpatient Encounter Medications as of 02/13/2020  Medication Sig  . acetaminophen (TYLENOL) 650 MG suppository Place 650 mg rectally every 4 (four) hours as needed.  Marland Kitchen amLODipine (NORVASC) 10 MG tablet Take 1 tablet (10 mg total) by mouth daily.  Marland Kitchen aspirin EC 81 MG EC tablet Take 1 tablet (81 mg total) by mouth daily.  Marland Kitchen atorvastatin (LIPITOR) 40 MG tablet Take 1 tablet (40 mg total) by mouth daily at 6 PM. (Patient taking differently: Take 40 mg daily by mouth. )  . cloNIDine (CATAPRES) 0.2 MG tablet Take 1  tablet (0.2 mg total) by mouth 2 (two) times daily.  . famotidine (PEPCID) 20 MG tablet Take 1 tablet (20 mg total) by mouth at bedtime.  . ferrous sulfate 325 (65 FE)  MG tablet Take 1 tablet (325 mg total) 3 (three) times daily with meals by mouth.  . hydrALAZINE (APRESOLINE) 100 MG tablet Take 100 mg by mouth 3 (three) times daily.  . hydrochlorothiazide (HYDRODIURIL) 25 MG tablet Take 1 tablet (25 mg total) by mouth daily. (Patient not taking: Reported on 11/05/2019)  . hydrOXYzine (ATARAX/VISTARIL) 25 MG tablet Take 1 tablet (25 mg total) by mouth every 6 (six) hours as needed for anxiety. (Patient not taking: Reported on 04/03/2019)  . loratadine (CLARITIN) 10 MG tablet Take 10 mg by mouth daily.  Marland Kitchen omeprazole (PRILOSEC) 40 MG capsule Take 1 capsule (40 mg total) by mouth 2 (two) times daily before a meal.  . ondansetron (ZOFRAN ODT) 4 MG disintegrating tablet Take 1 tablet (4 mg total) every 8 (eight) hours as needed by mouth for nausea or vomiting. (Patient not taking: Reported on 04/03/2019)  . ondansetron (ZOFRAN) 4 MG tablet   . phenytoin (DILANTIN) 300 MG ER capsule Take 1 capsule (300 mg total) by mouth at bedtime.  . polyethylene glycol (MIRALAX / GLYCOLAX) packet Take 17 g by mouth daily. (Patient taking differently: Take 17 g daily as needed by mouth. )  . QUEtiapine (SEROQUEL) 25 MG tablet Take 1 tablet (25 mg total) by mouth at bedtime. (Patient not taking: Reported on 04/03/2019)  . traZODone (DESYREL) 50 MG tablet Take 1 tablet (50 mg total) by mouth at bedtime. (Patient taking differently: Take 25 mg by mouth at bedtime. )   No facility-administered encounter medications on file as of 02/13/2020.    PHYSICAL EXAM:   General:  frail appearing, thin, chronically ill, male Cardiovascular: regular rate and rhythm Pulmonary: coarse, decreased Neurological: functionally quadriplegic  Laker Thompson Prince Rome, NP

## 2020-02-18 ENCOUNTER — Encounter: Payer: Self-pay | Admitting: Nurse Practitioner

## 2020-02-18 ENCOUNTER — Non-Acute Institutional Stay: Payer: Medicare Other | Admitting: Nurse Practitioner

## 2020-02-18 DIAGNOSIS — Z515 Encounter for palliative care: Secondary | ICD-10-CM

## 2020-02-18 DIAGNOSIS — F0390 Unspecified dementia without behavioral disturbance: Secondary | ICD-10-CM

## 2020-02-18 NOTE — Progress Notes (Signed)
Therapist, nutritional Palliative Care Consult Note Telephone: [phone]  Fax: [phone]  PATIENT NAME: [name] DOB: 05-Feb-1956 MRN: [id number]  PRIMARY CARE PROVIDER:Hodges, Para March, MD  REFERRING PROVIDER:Dr Hodges/[facility] RESPONSIBLE PARTY:Morgan Small [phone]  RECOMMENDATIONS and PLAN: 1.ACP:DNR/MOST form in [place]; Hospice referral pending  2.Palliative care encounter Palliative medicine team will continue to support patient, patient's family, and medical team. Visit consisted of counseling and education dealing with the complex and emotionally intense issues of symptom management and palliative care in the setting of serious and potentially life-threatening illness  I spent 60 minutes providing this consultation,  Starting at 10:00am. More than 50% of the time in this consultation was spent coordinating communication.   HISTORY OF PRESENT ILLNESS:  [name] is a 64 y.o. year old male with multiple medical problems including Vascular Dementia, cerebellar stroke, dysphagia, chronic kidney disease, legally blind, hypertension, hyperlipidemia, seizure disorder, iron deficiency anemia, insomnia.Mr. Sofranko resides [facility] at [facility]. Mr. Mazo remain bed-bound, total functionally dependent on staff. Mr Savannah does required to be fed and appetite remains declined. Staff endorses no new changes this week. At present Mr. Kyllo is lying in bed. He appears chronically ill, debilitated, then. Mr Mansfield did turn his head to voice. Mr Rode did not respond with answers to questions. Mr Hilbert was cooperative with assessment. Mr Goodpasture does continue to appear to the overall declining. Hospice referral pending. Medical goals review. Emotional support provided. I have updated nursing staff. Mr Luppino does appear comfortable, no new recommendations or changes to goals or plan of  care Palliative Care was asked to help to continue to address goals of care.   CODE STATUS: DNR  PPS: 30% HOSPICE ELIGIBILITY/DIAGNOSIS: yes per Hospice Physician  PAST MEDICAL HISTORY:  Past Medical History:  Diagnosis Date  . Hypertension   . Seizures (HCC)   . Stroke [facility])     SOCIAL HX:  Social History   Tobacco Use  . Smoking status: Former Games developer  . Smokeless tobacco: Never Used  Substance Use Topics  . Alcohol use: No    ALLERGIES:  Allergies  Allergen Reactions  . Iodine Anaphylaxis and Itching  . Shellfish Allergy Hives and Swelling     PERTINENT MEDICATIONS:  Outpatient Encounter Medications as of 02/18/2020  Medication Sig  . acetaminophen (TYLENOL) 650 MG suppository Place 650 mg rectally every 4 (four) hours as needed.  Marland Kitchen amLODipine (NORVASC) 10 MG tablet Take 1 tablet (10 mg total) by mouth daily.  Marland Kitchen aspirin EC 81 MG EC tablet Take 1 tablet (81 mg total) by mouth daily.  Marland Kitchen atorvastatin (LIPITOR) 40 MG tablet Take 1 tablet (40 mg total) by mouth daily at 6 PM. (Patient taking differently: Take 40 mg daily by mouth. )  . cloNIDine (CATAPRES) 0.2 MG tablet Take 1 tablet (0.2 mg total) by mouth 2 (two) times daily.  . famotidine (PEPCID) 20 MG tablet Take 1 tablet (20 mg total) by mouth at bedtime.  . ferrous sulfate 325 (65 FE) MG tablet Take 1 tablet (325 mg total) 3 (three) times daily with meals by mouth.  . hydrALAZINE (APRESOLINE) 100 MG tablet Take 100 mg by mouth 3 (three) times daily.  . hydrochlorothiazide (HYDRODIURIL) 25 MG tablet Take 1 tablet (25 mg total) by mouth daily. (Patient not taking: Reported on 11/05/2019)  . hydrOXYzine (ATARAX/VISTARIL) 25 MG tablet Take 1 tablet (25 mg total) by mouth every 6 (six) hours as  needed for anxiety. (Patient not taking: Reported on 04/03/2019)  . loratadine (CLARITIN) 10 MG tablet Take 10 mg by mouth daily.  Marland Kitchen omeprazole (PRILOSEC) 40 MG capsule Take 1 capsule (40 mg total) by mouth 2 (two) times daily before a  meal.  . ondansetron (ZOFRAN ODT) 4 MG disintegrating tablet Take 1 tablet (4 mg total) every 8 (eight) hours as needed by mouth for nausea or vomiting. (Patient not taking: Reported on 04/03/2019)  . ondansetron (ZOFRAN) 4 MG tablet   . phenytoin (DILANTIN) 300 MG ER capsule Take 1 capsule (300 mg total) by mouth at bedtime.  . polyethylene glycol (MIRALAX / GLYCOLAX) packet Take 17 g by mouth daily. (Patient taking differently: Take 17 g daily as needed by mouth. )  . QUEtiapine (SEROQUEL) 25 MG tablet Take 1 tablet (25 mg total) by mouth at bedtime. (Patient not taking: Reported on 04/03/2019)  . traZODone (DESYREL) 50 MG tablet Take 1 tablet (50 mg total) by mouth at bedtime. (Patient taking differently: Take 25 mg by mouth at bedtime. )   No facility-administered encounter medications on file as of 02/18/2020.    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

## 2020-02-19 ENCOUNTER — Other Ambulatory Visit: Payer: Self-pay

## 2020-03-04 ENCOUNTER — Other Ambulatory Visit: Payer: Self-pay

## 2020-03-04 ENCOUNTER — Ambulatory Visit: Payer: Medicare Other | Admitting: Gastroenterology

## 2020-07-26 DEATH — deceased
